# Patient Record
Sex: Female | Born: 1955 | Race: White | Hispanic: No | Marital: Married | Smoking: Former smoker
Health system: Southern US, Community
[De-identification: ages and names within clinical notes are randomized; demographics above are authoritative.]

## PROBLEM LIST (undated history)

## (undated) DIAGNOSIS — T7840XA Allergy, unspecified, initial encounter: Secondary | ICD-10-CM

## (undated) DIAGNOSIS — K219 Gastro-esophageal reflux disease without esophagitis: Secondary | ICD-10-CM

## (undated) DIAGNOSIS — M1990 Unspecified osteoarthritis, unspecified site: Secondary | ICD-10-CM

## (undated) DIAGNOSIS — L570 Actinic keratosis: Secondary | ICD-10-CM

## (undated) DIAGNOSIS — G43909 Migraine, unspecified, not intractable, without status migrainosus: Secondary | ICD-10-CM

## (undated) DIAGNOSIS — E079 Disorder of thyroid, unspecified: Secondary | ICD-10-CM

## (undated) DIAGNOSIS — K859 Acute pancreatitis without necrosis or infection, unspecified: Secondary | ICD-10-CM

## (undated) HISTORY — PX: SPINE SURGERY: SHX786

## (undated) HISTORY — DX: Unspecified osteoarthritis, unspecified site: M19.90

## (undated) HISTORY — DX: Gastro-esophageal reflux disease without esophagitis: K21.9

## (undated) HISTORY — PX: ABDOMINAL HYSTERECTOMY: SHX81

## (undated) HISTORY — PX: TOTAL ABDOMINAL HYSTERECTOMY: SHX209

## (undated) HISTORY — DX: Allergy, unspecified, initial encounter: T78.40XA

## (undated) HISTORY — DX: Migraine, unspecified, not intractable, without status migrainosus: G43.909

## (undated) HISTORY — DX: Actinic keratosis: L57.0

## (undated) HISTORY — DX: Disorder of thyroid, unspecified: E07.9

## (undated) HISTORY — PX: CHOLECYSTECTOMY: SHX55

## (undated) HISTORY — PX: APPENDECTOMY: SHX54

## (undated) HISTORY — PX: TUBAL LIGATION: SHX77

## (undated) HISTORY — PX: FOOT SURGERY: SHX648

## (undated) HISTORY — PX: OTHER SURGICAL HISTORY: SHX169

## (undated) HISTORY — DX: Acute pancreatitis without necrosis or infection, unspecified: K85.90

---

## 2016-11-28 DIAGNOSIS — Z86010 Personal history of colonic polyps: Secondary | ICD-10-CM | POA: Diagnosis not present

## 2016-11-28 DIAGNOSIS — K2950 Unspecified chronic gastritis without bleeding: Secondary | ICD-10-CM | POA: Diagnosis not present

## 2016-11-28 DIAGNOSIS — R1013 Epigastric pain: Secondary | ICD-10-CM | POA: Diagnosis not present

## 2016-11-28 DIAGNOSIS — K2960 Other gastritis without bleeding: Secondary | ICD-10-CM | POA: Diagnosis not present

## 2016-11-28 DIAGNOSIS — Z800 Family history of malignant neoplasm of digestive organs: Secondary | ICD-10-CM | POA: Diagnosis not present

## 2016-11-28 DIAGNOSIS — K219 Gastro-esophageal reflux disease without esophagitis: Secondary | ICD-10-CM | POA: Diagnosis not present

## 2016-11-28 DIAGNOSIS — Z8371 Family history of colonic polyps: Secondary | ICD-10-CM | POA: Diagnosis not present

## 2016-11-28 DIAGNOSIS — Z1211 Encounter for screening for malignant neoplasm of colon: Secondary | ICD-10-CM | POA: Diagnosis not present

## 2016-11-28 LAB — HM COLONOSCOPY

## 2016-12-24 DIAGNOSIS — E041 Nontoxic single thyroid nodule: Secondary | ICD-10-CM | POA: Diagnosis not present

## 2016-12-24 DIAGNOSIS — E559 Vitamin D deficiency, unspecified: Secondary | ICD-10-CM | POA: Diagnosis not present

## 2016-12-24 DIAGNOSIS — Z0000 Encounter for general adult medical examination without abnormal findings: Secondary | ICD-10-CM | POA: Diagnosis not present

## 2016-12-28 DIAGNOSIS — E041 Nontoxic single thyroid nodule: Secondary | ICD-10-CM | POA: Diagnosis not present

## 2016-12-28 DIAGNOSIS — Z0000 Encounter for general adult medical examination without abnormal findings: Secondary | ICD-10-CM | POA: Diagnosis not present

## 2016-12-28 DIAGNOSIS — E039 Hypothyroidism, unspecified: Secondary | ICD-10-CM | POA: Diagnosis not present

## 2016-12-28 DIAGNOSIS — Z23 Encounter for immunization: Secondary | ICD-10-CM | POA: Diagnosis not present

## 2016-12-28 DIAGNOSIS — E559 Vitamin D deficiency, unspecified: Secondary | ICD-10-CM | POA: Diagnosis not present

## 2017-01-22 DIAGNOSIS — L821 Other seborrheic keratosis: Secondary | ICD-10-CM | POA: Diagnosis not present

## 2017-01-22 DIAGNOSIS — L570 Actinic keratosis: Secondary | ICD-10-CM | POA: Diagnosis not present

## 2017-02-19 DIAGNOSIS — Z1231 Encounter for screening mammogram for malignant neoplasm of breast: Secondary | ICD-10-CM | POA: Diagnosis not present

## 2017-02-19 DIAGNOSIS — M8589 Other specified disorders of bone density and structure, multiple sites: Secondary | ICD-10-CM | POA: Diagnosis not present

## 2017-02-21 DIAGNOSIS — R1011 Right upper quadrant pain: Secondary | ICD-10-CM | POA: Diagnosis not present

## 2017-02-21 DIAGNOSIS — R1013 Epigastric pain: Secondary | ICD-10-CM | POA: Diagnosis not present

## 2017-04-15 DIAGNOSIS — R1013 Epigastric pain: Secondary | ICD-10-CM | POA: Diagnosis not present

## 2017-04-30 DIAGNOSIS — K831 Obstruction of bile duct: Secondary | ICD-10-CM | POA: Diagnosis not present

## 2017-04-30 DIAGNOSIS — K838 Other specified diseases of biliary tract: Secondary | ICD-10-CM | POA: Diagnosis not present

## 2017-04-30 DIAGNOSIS — K859 Acute pancreatitis without necrosis or infection, unspecified: Secondary | ICD-10-CM | POA: Diagnosis not present

## 2017-04-30 DIAGNOSIS — R1013 Epigastric pain: Secondary | ICD-10-CM | POA: Diagnosis not present

## 2017-05-03 DIAGNOSIS — R1013 Epigastric pain: Secondary | ICD-10-CM | POA: Diagnosis not present

## 2017-05-03 DIAGNOSIS — K3184 Gastroparesis: Secondary | ICD-10-CM | POA: Diagnosis not present

## 2017-05-03 DIAGNOSIS — K8500 Idiopathic acute pancreatitis without necrosis or infection: Secondary | ICD-10-CM | POA: Diagnosis not present

## 2017-05-10 DIAGNOSIS — Z23 Encounter for immunization: Secondary | ICD-10-CM | POA: Diagnosis not present

## 2017-05-28 DIAGNOSIS — K3184 Gastroparesis: Secondary | ICD-10-CM | POA: Diagnosis not present

## 2017-09-11 DIAGNOSIS — M7702 Medial epicondylitis, left elbow: Secondary | ICD-10-CM | POA: Diagnosis not present

## 2017-09-11 DIAGNOSIS — M25522 Pain in left elbow: Secondary | ICD-10-CM | POA: Diagnosis not present

## 2017-09-11 DIAGNOSIS — M7712 Lateral epicondylitis, left elbow: Secondary | ICD-10-CM | POA: Diagnosis not present

## 2017-10-11 DIAGNOSIS — B349 Viral infection, unspecified: Secondary | ICD-10-CM | POA: Diagnosis not present

## 2018-01-10 DIAGNOSIS — Z0000 Encounter for general adult medical examination without abnormal findings: Secondary | ICD-10-CM | POA: Diagnosis not present

## 2018-01-10 DIAGNOSIS — M810 Age-related osteoporosis without current pathological fracture: Secondary | ICD-10-CM | POA: Insufficient documentation

## 2018-01-10 DIAGNOSIS — M8589 Other specified disorders of bone density and structure, multiple sites: Secondary | ICD-10-CM | POA: Diagnosis not present

## 2018-01-10 DIAGNOSIS — E039 Hypothyroidism, unspecified: Secondary | ICD-10-CM | POA: Insufficient documentation

## 2018-01-10 DIAGNOSIS — K219 Gastro-esophageal reflux disease without esophagitis: Secondary | ICD-10-CM | POA: Diagnosis not present

## 2018-01-10 DIAGNOSIS — M8580 Other specified disorders of bone density and structure, unspecified site: Secondary | ICD-10-CM | POA: Insufficient documentation

## 2018-01-29 DIAGNOSIS — E039 Hypothyroidism, unspecified: Secondary | ICD-10-CM | POA: Diagnosis not present

## 2018-02-06 DIAGNOSIS — M8589 Other specified disorders of bone density and structure, multiple sites: Secondary | ICD-10-CM | POA: Diagnosis not present

## 2018-03-10 ENCOUNTER — Ambulatory Visit: Payer: BLUE CROSS/BLUE SHIELD

## 2018-03-31 ENCOUNTER — Encounter: Payer: Self-pay | Admitting: Urology

## 2018-03-31 ENCOUNTER — Ambulatory Visit (INDEPENDENT_AMBULATORY_CARE_PROVIDER_SITE_OTHER): Payer: BLUE CROSS/BLUE SHIELD | Admitting: Urology

## 2018-03-31 VITALS — BP 113/73 | HR 85 | Ht 67.5 in | Wt 167.8 lb

## 2018-03-31 DIAGNOSIS — N390 Urinary tract infection, site not specified: Secondary | ICD-10-CM

## 2018-03-31 LAB — URINALYSIS, COMPLETE
Bilirubin, UA: NEGATIVE
GLUCOSE, UA: NEGATIVE
KETONES UA: NEGATIVE
Leukocytes, UA: NEGATIVE
NITRITE UA: NEGATIVE
Protein, UA: NEGATIVE
Specific Gravity, UA: 1.02 (ref 1.005–1.030)
UUROB: 0.2 mg/dL (ref 0.2–1.0)
pH, UA: 5.5 (ref 5.0–7.5)

## 2018-03-31 LAB — MICROSCOPIC EXAMINATION

## 2018-03-31 MED ORDER — NITROFURANTOIN MONOHYD MACRO 100 MG PO CAPS: 100.0000 mg | ORAL_CAPSULE | ORAL | 4 refills | Status: DC | PRN

## 2018-03-31 NOTE — Progress Notes (Signed)
03/31/2018 11:47 AM   Cindy Jefferson [birth year] [id number]  Referring provider: Glendon Axe, MD [facility] [address], [place] [address]  Chief Complaint  Patient presents with  . Recurrent UTI    HPI: I was consulted to assess the patient recurrent bladder infections.  She saw a urologist a few years ago in [place].  She was doing well on postcoital prophylaxis with Macrobid.  She then had a breakthrough infection.  When she treated herself with Macrobid for a number of days she had nausea.  She was then switched to Keflex after intercourse but is still had 2 infections.  When she is not infected she leaks with coughing sneezing and running.  She has urge incontinence.  The stress components is more significant.  She wears 2 or 3 pads a day that are damp unless she last or runs hard.  She describes an ultrasound from her previous urologist  She denies a history of kidney stones and previous GU surgery.  She just moved to the area  Modifying factors: There are no other modifying factors  Associated signs and symptoms: There are no other associated signs and symptoms Aggravating and relieving factors: There are no other aggravating or relieving factors Severity: Moderate Duration: Persistent   PMH: Past Medical History:  Diagnosis Date  . GERD (gastroesophageal reflux disease)   . Thyroid disease     Surgical History:   Home Medications:  Allergies as of 03/31/2018      Reactions   Valproic Acid Other (See Comments)   Other reaction(s): Other (See Comments) pancreatitis  pancreatitis    Tape    Other reaction(s): Unknown Other reaction(s): BURNS SKIN Other reaction(s): BURNS SKIN   Erythromycin Nausea Only      Medication List        Accurate as of 03/31/18 11:47 AM. Always use your most recent med list.          cephALEXin 500 MG capsule Commonly known as:  KEFLEX Take 500 mg by mouth 4 (four) times daily.   levothyroxine 75  MCG tablet Commonly known as:  SYNTHROID, LEVOTHROID Take 75 mcg by mouth daily.   magnesium oxide 400 MG tablet Commonly known as:  MAG-OX Take by mouth.   mometasone 50 MCG/ACT nasal spray Commonly known as:  NASONEX Place into the nose.   nitrofurantoin (macrocrystal-monohydrate) 100 MG capsule Commonly known as:  MACROBID Take 1 capsule (100 mg total) by mouth as needed (as directed).   ranitidine 150 MG capsule Commonly known as:  ZANTAC TAKE 1 CAPSULE (150 MG TOTAL) BY MOUTH EVERY MORNING   RELPAX 40 MG tablet Generic drug:  eletriptan Take by mouth.   Vitamin D3 2000 units capsule Take by mouth.       Allergies:  Allergies  Allergen Reactions  . Valproic Acid Other (See Comments)    Other reaction(s): Other (See Comments) pancreatitis  pancreatitis    . Tape     Other reaction(s): Unknown Other reaction(s): BURNS SKIN Other reaction(s): BURNS SKIN   . Erythromycin Nausea Only    Family History: Family History  Problem Relation Age of Onset  . Bladder Cancer Maternal Uncle   . Kidney cancer Neg Hx     Social History:  reports that she has never smoked. She has never used smokeless tobacco. She reports that she drinks alcohol. She reports that she does not use drugs.  ROS: UROLOGY Frequent Urination?: No Hard to postpone urination?: Yes Burning/pain with  urination?: No Get up at night to urinate?: Yes Leakage of urine?: Yes Urine stream starts and stops?: No Trouble starting stream?: No Do you have to strain to urinate?: No Blood in urine?: No Urinary tract infection?: No Sexually transmitted disease?: No Injury to kidneys or bladder?: No Painful intercourse?: No Weak stream?: No Currently pregnant?: No Vaginal bleeding?: No Last menstrual period?: n  Gastrointestinal Nausea?: No Vomiting?: No Indigestion/heartburn?: Yes Diarrhea?: No Constipation?: No  Constitutional Fever: No Night sweats?: No Weight loss?: No Fatigue?:  No  Skin Skin rash/lesions?: No Itching?: No  Eyes Blurred vision?: No Double vision?: No  Ears/Nose/Throat Sore throat?: No Sinus problems?: No  Hematologic/Lymphatic Swollen glands?: No Easy bruising?: No  Cardiovascular Leg swelling?: No Chest pain?: No  Respiratory Cough?: No Shortness of breath?: No  Endocrine Excessive thirst?: No  Musculoskeletal Back pain?: Yes Joint pain?: Yes  Neurological Headaches?: Yes Dizziness?: No  Psychologic Depression?: No Anxiety?: No  Physical Exam: BP 113/73 (BP Location: Left Arm, Patient Position: Sitting, Cuff Size: Normal)   Pulse 85   Ht 5' 7.5" (1.715 m)   Wt 167 lb 12.8 oz (76.1 kg)   BMI 25.89 kg/m   Constitutional:  Alert and oriented, No acute distress. HEENT: [place] AT, moist mucus membranes.  Trachea midline, no masses. Cardiovascular: No clubbing, cyanosis, or edema. Respiratory: Normal respiratory effort, no increased work of breathing. GI: Abdomen is soft, nontender, nondistended, no abdominal masses GU: No CVA tenderness.  No bladder tenderness Skin: No rashes, bruises or suspicious lesions. Lymph: No cervical or inguinal adenopathy. Neurologic: Grossly intact, no focal deficits, moving all 4 extremities. Psychiatric: Normal mood and affect.  Laboratory Data:  Urinalysis No results found for: COLORURINE, APPEARANCEUR, LABSPEC, PHURINE, GLUCOSEU, HGBUR, BILIRUBINUR, KETONESUR, PROTEINUR, UROBILINOGEN, NITRITE, LEUKOCYTESUR  Pertinent Imaging:   Assessment & Plan: The patient was not clinically infected today.  I offered postcoital Macrodantin.  Hopefully will not upset her stomach when only taking it infrequently and 1 tablet at the time.  Otherwise daily suppression therapy for instance with Keflex or trimethoprim would be appropriate this was discussed.  Call if urine culture is positive.  Reassess in 6 months  1. Recurrent UTI  - Urinalysis, Complete   Return in about 6 months (around  10/01/2018).  Reece Packer, MD  [facility] [address] [place], [place] [address] [phone]

## 2018-04-03 LAB — CULTURE, URINE COMPREHENSIVE

## 2018-06-09 DIAGNOSIS — M7751 Other enthesopathy of right foot: Secondary | ICD-10-CM | POA: Diagnosis not present

## 2018-06-09 DIAGNOSIS — M2012 Hallux valgus (acquired), left foot: Secondary | ICD-10-CM | POA: Diagnosis not present

## 2018-06-09 DIAGNOSIS — M79675 Pain in left toe(s): Secondary | ICD-10-CM | POA: Diagnosis not present

## 2018-06-24 DIAGNOSIS — E559 Vitamin D deficiency, unspecified: Secondary | ICD-10-CM | POA: Diagnosis not present

## 2018-06-24 DIAGNOSIS — E039 Hypothyroidism, unspecified: Secondary | ICD-10-CM | POA: Diagnosis not present

## 2018-06-24 DIAGNOSIS — K219 Gastro-esophageal reflux disease without esophagitis: Secondary | ICD-10-CM | POA: Diagnosis not present

## 2018-06-24 DIAGNOSIS — M810 Age-related osteoporosis without current pathological fracture: Secondary | ICD-10-CM | POA: Diagnosis not present

## 2018-06-24 DIAGNOSIS — Z23 Encounter for immunization: Secondary | ICD-10-CM | POA: Diagnosis not present

## 2018-06-30 DIAGNOSIS — M7751 Other enthesopathy of right foot: Secondary | ICD-10-CM | POA: Diagnosis not present

## 2018-06-30 DIAGNOSIS — M79675 Pain in left toe(s): Secondary | ICD-10-CM | POA: Diagnosis not present

## 2018-06-30 DIAGNOSIS — M2012 Hallux valgus (acquired), left foot: Secondary | ICD-10-CM | POA: Diagnosis not present

## 2018-08-12 DIAGNOSIS — L814 Other melanin hyperpigmentation: Secondary | ICD-10-CM | POA: Diagnosis not present

## 2018-08-12 DIAGNOSIS — L820 Inflamed seborrheic keratosis: Secondary | ICD-10-CM | POA: Diagnosis not present

## 2018-08-12 DIAGNOSIS — D229 Melanocytic nevi, unspecified: Secondary | ICD-10-CM | POA: Diagnosis not present

## 2018-09-14 ENCOUNTER — Encounter: Payer: Self-pay | Admitting: Nurse Practitioner

## 2018-09-15 ENCOUNTER — Other Ambulatory Visit: Payer: Self-pay

## 2018-09-15 ENCOUNTER — Encounter: Payer: Self-pay | Admitting: Nurse Practitioner

## 2018-09-15 ENCOUNTER — Ambulatory Visit: Payer: BLUE CROSS/BLUE SHIELD | Admitting: Nurse Practitioner

## 2018-09-15 VITALS — BP 128/79 | HR 67 | Temp 97.5°F | Ht 66.8 in | Wt 170.0 lb

## 2018-09-15 DIAGNOSIS — G43909 Migraine, unspecified, not intractable, without status migrainosus: Secondary | ICD-10-CM | POA: Insufficient documentation

## 2018-09-15 DIAGNOSIS — Z1231 Encounter for screening mammogram for malignant neoplasm of breast: Secondary | ICD-10-CM

## 2018-09-15 DIAGNOSIS — Z6827 Body mass index (BMI) 27.0-27.9, adult: Secondary | ICD-10-CM | POA: Insufficient documentation

## 2018-09-15 DIAGNOSIS — K219 Gastro-esophageal reflux disease without esophagitis: Secondary | ICD-10-CM

## 2018-09-15 DIAGNOSIS — E039 Hypothyroidism, unspecified: Secondary | ICD-10-CM | POA: Diagnosis not present

## 2018-09-15 DIAGNOSIS — M8589 Other specified disorders of bone density and structure, multiple sites: Secondary | ICD-10-CM

## 2018-09-15 DIAGNOSIS — E663 Overweight: Secondary | ICD-10-CM | POA: Diagnosis not present

## 2018-09-15 DIAGNOSIS — Z9189 Other specified personal risk factors, not elsewhere classified: Secondary | ICD-10-CM

## 2018-09-15 NOTE — Assessment & Plan Note (Signed)
Continue weekly exercise (30 minutes 5 days a week) and focus on diet regimen.

## 2018-09-15 NOTE — Assessment & Plan Note (Signed)
Chronic, stable.  Continue current medication regimen.  Magnesium level next visit.

## 2018-09-15 NOTE — Assessment & Plan Note (Signed)
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  Continue to monitor mood and for s/s caregiver role strain.

## 2018-09-15 NOTE — Progress Notes (Signed)
New Patient Office Visit  Subjective:  Patient ID: Cindy Jefferson, female    DOB: Mar 21, 1956  Age: 63 y.o. MRN: [id number]  CC:  Chief Complaint  Patient presents with  . Establish Care    HPI Cindy Jefferson presents for new patient visit to establish care.  Introduced to Designer, jewellery role and practice setting.  All questions answered.  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  GERD Has been on Pepcid for two months, was previously on Zantac.  Her GI provider had placed her on Protonix.  Has been seen by GI at [facility] and [facility].  Had her last colonoscopy in 2018 and is due again in 5 years, d/t polyps (2 removed). GERD control status: controlled  Satisfied with current treatment? yes Heartburn frequency:  Medication side effects: no  Medication compliance: stable Previous GERD medications: Antacid use frequency:   Duration:  Nature:  Location:  Heartburn duration:  Alleviatiating factors:   Aggravating factors:  Dysphagia: no Odynophagia:  no Hematemesis: no Blood in stool: no EGD: no   HYPOTHYROIDISM Saw endo in [place] for "awhile because they said I had a nodule, but the last time I went they said it was down to nothing and I did not need to worry about it".  Has been on Levothyroxine 75 MCG daily.  It has "been pretty long since last ultrasound".  Last TSH 1.841 on 01/10/18.  Thyroid control status:controlled Satisfied with current treatment? yes Medication side effects: no Medication compliance: good compliance Etiology of hypothyroidism:  Recent dose adjustment:no Fatigue: no Cold intolerance: no Heat intolerance: no Weight gain: no Weight loss: no Constipation: no Diarrhea/loose stools: no Palpitations: no Lower extremity edema: no Anxiety/depressed mood: no   OSTEOPOROSIS Last DEXA June 2019 with hip -2.5, fem neck -2.3, and  spine -1.7.  Currently takes daily Vitamin D.  Is post menopausal. Satisfied with current treatment?: no Medication side effects: no Medication compliance: good compliance Past osteoporosis medications/treatments:  Adequate calcium & vitamin D: yes Intolerance to bisphosphonates:no Weight bearing exercises: yes  Past Medical History:  Diagnosis Date  . GERD (gastroesophageal reflux disease)   . Migraine   . Migraines   . Pancreatitis   . Thyroid disease     Past Surgical History:  Procedure Laterality Date  . ABDOMINAL HYSTERECTOMY     total, does not get pap smears   . CHOLECYSTECTOMY    . FOOT SURGERY    . neck fusion      Family History  Problem Relation Age of Onset  . Hyperlipidemia Mother   . Heart disease Mother   . Lung cancer Mother   . Leukemia Father   . Arthritis Sister   . Thyroid disease Sister   . Colon cancer Maternal Grandfather   . Lupus Daughter   . Kidney cancer Neg Hx     Social History   Socioeconomic History  . Marital status: Married    Spouse name: Not on file  . Number of children: 2  . Years of education: Not on file  . Highest education level: Not on file  Occupational History  . Occupation: retired  Scientific laboratory technician  . Financial resource strain: Not hard at all  . Food insecurity:    Worry: Never true    Inability: Never true  . Transportation needs:    Medical: No    Non-medical: No  Tobacco Use  . Smoking status: Never Smoker  . Smokeless tobacco: Never Used  Substance and Sexual Activity  . Alcohol use: Not Currently  . Drug use: Never  . Sexual activity: Yes  Lifestyle  . Physical activity:    Days per week: 4 days    Minutes per session: 30 min  . Stress: To some extent  Relationships  . Social connections:    Talks on phone: More than three times a week    Gets together: More than three times a week    Attends religious service: Never    Active member of club or organization: No    Attends meetings of clubs or  organizations: Never    Relationship status: Married  . Intimate partner violence:    Fear of current or ex partner: No    Emotionally abused: No    Physically abused: No    Forced sexual activity: No  Other Topics Concern  . Not on file  Social History Narrative  . Not on file    ROS Review of Systems  Constitutional: Negative for activity change, appetite change, diaphoresis, fatigue and fever.  Respiratory: Negative for cough, chest tightness and shortness of breath.   Cardiovascular: Negative for chest pain, palpitations and leg swelling.  Gastrointestinal: Negative for abdominal distention, abdominal pain, constipation, diarrhea, nausea and vomiting.  Endocrine: Negative for cold intolerance, heat intolerance, polydipsia, polyphagia and polyuria.  Neurological: Negative for dizziness, syncope, weakness, light-headedness, numbness and headaches.  Psychiatric/Behavioral: Negative.     Objective:   Today's Vitals: BP 128/79   Pulse 67   Temp (!) 97.5 F (36.4 C) (Oral)   Ht 5' 6.8" (1.697 m)   Wt 170 lb (77.1 kg)   SpO2 100%   BMI 26.79 kg/m   Physical Exam Vitals signs and nursing note reviewed.  Constitutional:      General: She is awake.     Appearance: She is well-developed.  HENT:     Head: Normocephalic.     Right Ear: Hearing normal.     Left Ear: Hearing normal.     Nose: Nose normal.     Mouth/Throat:     Mouth: Mucous membranes are moist.  Eyes:     General: Lids are normal.        Right eye: No discharge.        Left eye: No discharge.     Conjunctiva/sclera: Conjunctivae normal.     Pupils: Pupils are equal, round, and reactive to light.  Neck:     Musculoskeletal: Normal range of motion and neck supple.     Thyroid: No thyromegaly.     Vascular: No carotid bruit or JVD.  Cardiovascular:     Rate and Rhythm: Normal rate and regular rhythm.     Heart sounds: Normal heart sounds. No murmur. No gallop.   Pulmonary:     Effort: Pulmonary effort  is normal.     Breath sounds: Normal breath sounds.  Abdominal:     General: Bowel sounds are normal.     Palpations: Abdomen is soft. There is no hepatomegaly or splenomegaly.  Musculoskeletal:     Right lower leg: No edema.     Left lower leg: No edema.  Lymphadenopathy:     Cervical: No cervical adenopathy.  Skin:    General: Skin is warm and dry.  Neurological:     Mental Status: She is alert and oriented to person, place, and time.  Psychiatric:  Attention and Perception: Attention normal.        Mood and Affect: Mood normal.        Behavior: Behavior normal. Behavior is cooperative.        Thought Content: Thought content normal.        Judgment: Judgment normal.     Assessment & Plan:   Problem List Items Addressed This Visit      Digestive   GERD without esophagitis    Chronic, stable.  Continue current medication regimen.  Magnesium level next visit.      Relevant Medications   famotidine (PEPCID) 20 MG tablet     Endocrine   Acquired hypothyroidism - Primary    Chronic, stable.  Continue current medication regimen.  Thyroid panel next visit.        Musculoskeletal and Integument   Osteopenia of multiple sites    Chronic, last DEXA June 2019.  Continue Vitamin D.  Check level next visit.          Other   Overweight (BMI 25.0-29.9)    Continue weekly exercise (30 minutes 5 days a week) and focus on diet regimen.      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  Continue to monitor mood and for s/s caregiver role strain.       Other Visit Diagnoses    Screening mammogram, encounter for       Relevant Orders   MM DIGITAL SCREENING BILATERAL      Outpatient Encounter Medications as of 09/15/2018  Medication Sig  . Calcium-Cholecalciferol (CALCET PETITES) 200-250 MG-UNIT TABS Take by mouth.  . Cholecalciferol (VITAMIN D3) 2000 units capsule Take by mouth.  . eletriptan (RELPAX) 40 MG tablet Take by mouth.   . famotidine (PEPCID) 20 MG tablet Take by mouth.  . levothyroxine (SYNTHROID, LEVOTHROID) 75 MCG tablet Take 75 mcg by mouth daily.  . magnesium oxide (MAG-OX) 400 MG tablet Take by mouth.  . mometasone (NASONEX) 50 MCG/ACT nasal spray Place into the nose.  . nitrofurantoin, macrocrystal-monohydrate, (MACROBID) 100 MG capsule Take 1 capsule (100 mg total) by mouth as needed (as directed).  . [DISCONTINUED] cephALEXin (KEFLEX) 500 MG capsule Take 500 mg by mouth 4 (four) times daily.  . [DISCONTINUED] ranitidine (ZANTAC) 150 MG capsule TAKE 1 CAPSULE (150 MG TOTAL) BY MOUTH EVERY MORNING   No facility-administered encounter medications on file as of 09/15/2018.     Follow-up: Return in about 4 weeks (around 10/13/2018) for Physical exam.   Venita Lick, NP

## 2018-09-15 NOTE — Assessment & Plan Note (Signed)
Chronic, stable.  Continue current medication regimen.  Thyroid panel next visit.

## 2018-09-15 NOTE — Assessment & Plan Note (Signed)
Chronic, last DEXA June 2019.  Continue Vitamin D.  Check level next visit.

## 2018-09-15 NOTE — Patient Instructions (Signed)
Hypothyroidism  Hypothyroidism is when the thyroid gland does not make enough of certain hormones (it is underactive). The thyroid gland is a small gland located in the lower front part of the neck, just in front of the windpipe (trachea). This gland makes hormones that help control how the body uses food for energy (metabolism) as well as how the heart and brain function. These hormones also play a role in keeping your bones strong. When the thyroid is underactive, it produces too little of the hormones thyroxine (T4) and triiodothyronine (T3). What are the causes? This condition may be caused by:  Hashimoto's disease. This is a disease in which the body's disease-fighting system (immune system) attacks the thyroid gland. This is the most common cause.  Viral infections.  Pregnancy.  Certain medicines.  Birth defects.  Past radiation treatments to the head or neck for cancer.  Past treatment with radioactive iodine.  Past exposure to radiation in the environment.  Past surgical removal of part or all of the thyroid.  Problems with a gland in the center of the brain (pituitary gland).  Lack of enough iodine in the diet. What increases the risk? You are more likely to develop this condition if:  You are female.  You have a family history of thyroid conditions.  You use a medicine called lithium.  You take medicines that affect the immune system (immunosuppressants). What are the signs or symptoms? Symptoms of this condition include:  Feeling as though you have no energy (lethargy).  Not being able to tolerate cold.  Weight gain that is not explained by a change in diet or exercise habits.  Lack of appetite.  Dry skin.  Coarse hair.  Menstrual irregularity.  Slowing of thought processes.  Constipation.  Sadness or depression. How is this diagnosed? This condition may be diagnosed based on:  Your symptoms, your medical history, and a physical exam.  Blood  tests. You may also have imaging tests, such as an ultrasound or MRI. How is this treated? This condition is treated with medicine that replaces the thyroid hormones that your body does not make. After you begin treatment, it may take several weeks for symptoms to go away. Follow these instructions at home:  Take over-the-counter and prescription medicines only as told by your health care provider.  If you start taking any new medicines, tell your health care provider.  Keep all follow-up visits as told by your health care provider. This is important. ? As your condition improves, your dosage of thyroid hormone medicine may change. ? You will need to have blood tests regularly so that your health care provider can monitor your condition. Contact a health care provider if:  Your symptoms do not get better with treatment.  You are taking thyroid replacement medicine and you: ? Sweat a lot. ? Have tremors. ? Feel anxious. ? Lose weight rapidly. ? Cannot tolerate heat. ? Have emotional swings. ? Have diarrhea. ? Feel weak. Get help right away if you have:  Chest pain.  An irregular heartbeat.  A rapid heartbeat.  Difficulty breathing. Summary  Hypothyroidism is when the thyroid gland does not make enough of certain hormones (it is underactive).  When the thyroid is underactive, it produces too little of the hormones thyroxine (T4) and triiodothyronine (T3).  The most common cause is Hashimoto's disease, a disease in which the body's disease-fighting system (immune system) attacks the thyroid gland. The condition can also be caused by viral infections, medicine, pregnancy, or past   radiation treatment to the head or neck.  Symptoms may include weight gain, dry skin, constipation, feeling as though you do not have energy, and not being able to tolerate cold.  This condition is treated with medicine to replace the thyroid hormones that your body does not make. This information  is not intended to replace advice given to you by your health care provider. Make sure you discuss any questions you have with your health care provider. Document Released: 08/20/2005 Document Revised: 07/31/2017 Document Reviewed: 07/31/2017 Elsevier Interactive Patient Education  2019 [employer]  

## 2018-10-06 ENCOUNTER — Ambulatory Visit: Payer: BLUE CROSS/BLUE SHIELD | Admitting: Urology

## 2018-10-16 ENCOUNTER — Encounter: Payer: BLUE CROSS/BLUE SHIELD | Admitting: Nurse Practitioner

## 2018-10-19 ENCOUNTER — Encounter: Payer: Self-pay | Admitting: Nurse Practitioner

## 2018-10-20 ENCOUNTER — Ambulatory Visit: Payer: BLUE CROSS/BLUE SHIELD | Admitting: Urology

## 2018-10-20 ENCOUNTER — Encounter: Payer: Self-pay | Admitting: Urology

## 2018-10-20 VITALS — BP 107/70 | HR 74 | Ht 67.0 in | Wt 175.0 lb

## 2018-10-20 DIAGNOSIS — N3946 Mixed incontinence: Secondary | ICD-10-CM

## 2018-10-20 MED ORDER — NITROFURANTOIN MONOHYD MACRO 100 MG PO CAPS: 100.0000 mg | ORAL_CAPSULE | ORAL | 4 refills | Status: DC | PRN

## 2018-10-20 NOTE — Progress Notes (Signed)
10/20/2018 9:34 AM   Cindy Jefferson 04-09-1956 [id number]  Referring provider: Glendon Axe, MD [facility] [place], [place] [address]  Chief Complaint  Patient presents with  . Follow-up    9month    HPI: I was consulted to assess the patient recurrent bladder infections.  She saw a urologist a few years ago in [place].  She was doing well on postcoital prophylaxis with Macrobid.  She then had a breakthrough infection.  When she treated herself with Macrobid for a number of days she had nausea.  She was then switched to Keflex after intercourse but is still had 2 infections.  When she is not infected she leaks with coughing sneezing and running.  She has urge incontinence.  The stress components is more significant.  She wears 2 or 3 pads a day that are damp unless she last or runs hard.   The patient was not clinically infected today.  I offered postcoital Macrodantin.  Hopefully will not upset her stomach when only taking it infrequently and 1 tablet at the time.  Otherwise daily suppression therapy for instance with Keflex or trimethoprim would be appropriate this was discussed.  Call if urine culture is positive.  Reassess in 6 months  TOday No infections on postcoital Macrodantin.  No upset stomach.  Mild mixed incontinence stable and well-tolerated.  She mentioned renal function test but I did not have any to comment on  PMH: Past Medical History:  Diagnosis Date  . GERD (gastroesophageal reflux disease)   . Migraine   . Migraines   . Pancreatitis   . Thyroid disease     Surgical History: Past Surgical History:  Procedure Laterality Date  . ABDOMINAL HYSTERECTOMY     total, does not get pap smears   . CHOLECYSTECTOMY    . FOOT SURGERY    . neck fusion      Home Medications:  Allergies as of 10/20/2018      Reactions   Valproic Acid Other (See Comments)   Other reaction(s): Other (See Comments) pancreatitis  pancreatitis    Tape     Other reaction(s): Unknown Other reaction(s): BURNS SKIN Other reaction(s): BURNS SKIN   Erythromycin Nausea Only      Medication List       Accurate as of October 20, 2018  9:34 AM. Always use your most recent med list.        CALCET PETITES 200-250 MG-UNIT Tabs Generic drug:  Calcium-Cholecalciferol Take by mouth.   famotidine 20 MG tablet Commonly known as:  PEPCID Take by mouth.   levothyroxine 75 MCG tablet Commonly known as:  SYNTHROID, LEVOTHROID Take 75 mcg by mouth daily.   magnesium oxide 400 MG tablet Commonly known as:  MAG-OX Take by mouth.   mometasone 50 MCG/ACT nasal spray Commonly known as:  NASONEX Place into the nose.   nitrofurantoin (macrocrystal-monohydrate) 100 MG capsule Commonly known as:  MACROBID Take 1 capsule (100 mg total) by mouth as needed (as directed).   RELPAX 40 MG tablet Generic drug:  eletriptan Take by mouth.   Vitamin D3 50 MCG (2000 UT) capsule Take by mouth.       Allergies:  Allergies  Allergen Reactions  . Valproic Acid Other (See Comments)    Other reaction(s): Other (See Comments) pancreatitis  pancreatitis    . Tape     Other reaction(s): Unknown Other reaction(s): BURNS SKIN Other reaction(s): BURNS SKIN   . Erythromycin Nausea Only  Family History: Family History  Problem Relation Age of Onset  . Hyperlipidemia Mother   . Heart disease Mother   . Lung cancer Mother   . Leukemia Father   . Arthritis Sister   . Thyroid disease Sister   . Colon cancer Maternal Grandfather   . Lupus Daughter   . Kidney cancer Neg Hx     Social History:  reports that she has never smoked. She has never used smokeless tobacco. She reports previous alcohol use. She reports that she does not use drugs.  ROS: UROLOGY Frequent Urination?: No Hard to postpone urination?: No Burning/pain with urination?: No Get up at night to urinate?: No Leakage of urine?: No Urine stream starts and stops?: No Trouble  starting stream?: No Do you have to strain to urinate?: No Blood in urine?: No Urinary tract infection?: No Sexually transmitted disease?: No Injury to kidneys or bladder?: No Painful intercourse?: No Weak stream?: No Currently pregnant?: No Vaginal bleeding?: No Last menstrual period?: n  Gastrointestinal Nausea?: No Vomiting?: No Indigestion/heartburn?: No Diarrhea?: No Constipation?: No  Constitutional Fever: No Night sweats?: No Weight loss?: No Fatigue?: No  Skin Skin rash/lesions?: No Itching?: No  Eyes Blurred vision?: No Double vision?: No  Ears/Nose/Throat Sore throat?: No Sinus problems?: No  Hematologic/Lymphatic Swollen glands?: No Easy bruising?: No  Cardiovascular Leg swelling?: No Chest pain?: No  Respiratory Cough?: No Shortness of breath?: No  Endocrine Excessive thirst?: No  Musculoskeletal Back pain?: Yes Joint pain?: No  Neurological Headaches?: Yes Dizziness?: No  Psychologic Depression?: No Anxiety?: No  Physical Exam: BP 107/70   Pulse 74   Ht 5\' 7"  (1.702 m)   Wt 175 lb (79.4 kg)   BMI 27.41 kg/m   Constitutional:  Alert and oriented, No acute distress.   Laboratory Data: No results found for: WBC, HGB, HCT, MCV, PLT  No results found for: CREATININE  No results found for: PSA  No results found for: TESTOSTERONE  No results found for: HGBA1C  Urinalysis    Component Value Date/Time   APPEARANCEUR Clear 03/31/2018 0953   GLUCOSEU Negative 03/31/2018 0953   BILIRUBINUR Negative 03/31/2018 0953   PROTEINUR Negative 03/31/2018 0953   NITRITE Negative 03/31/2018 0953   LEUKOCYTESUR Negative 03/31/2018 0953    Pertinent Imaging:   Assessment & Plan: Prescription renewed and I will see her in 1 year  There are no diagnoses linked to this encounter.  No follow-ups on file.  Reece Packer, MD  [facility] [address] [place], [place] [address] [phone]

## 2018-10-22 ENCOUNTER — Encounter: Payer: Self-pay | Admitting: Nurse Practitioner

## 2018-10-22 ENCOUNTER — Ambulatory Visit
Admission: RE | Admit: 2018-10-22 | Discharge: 2018-10-22 | Disposition: A | Discharge status: Home / Self Care | Payer: BLUE CROSS/BLUE SHIELD | Source: Ambulatory Visit | Attending: Nurse Practitioner | Admitting: Nurse Practitioner

## 2018-10-22 ENCOUNTER — Other Ambulatory Visit: Payer: Self-pay

## 2018-10-22 ENCOUNTER — Ambulatory Visit: Payer: BLUE CROSS/BLUE SHIELD | Admitting: Nurse Practitioner

## 2018-10-22 VITALS — BP 111/73 | HR 69 | Temp 97.6°F | Ht 67.0 in | Wt 173.0 lb

## 2018-10-22 DIAGNOSIS — G8929 Other chronic pain: Secondary | ICD-10-CM | POA: Insufficient documentation

## 2018-10-22 DIAGNOSIS — M542 Cervicalgia: Secondary | ICD-10-CM | POA: Insufficient documentation

## 2018-10-22 DIAGNOSIS — G43709 Chronic migraine without aura, not intractable, without status migrainosus: Secondary | ICD-10-CM | POA: Diagnosis not present

## 2018-10-22 MED ORDER — ELETRIPTAN HYDROBROMIDE 40 MG PO TABS: 40.0000 mg | ORAL_TABLET | ORAL | 5 refills | Status: DC | PRN

## 2018-10-22 MED ORDER — DICLOFENAC SODIUM 1 % TD GEL: 2.0000 g | TRANSDERMAL | 2 refills | Status: AC | PRN

## 2018-10-22 NOTE — Assessment & Plan Note (Signed)
Chronic, ongoing.  Refill on Relpax sent.  Continue Magnesium daily.  Will discuss injectable options with her at next visit.

## 2018-10-22 NOTE — Progress Notes (Signed)
BP 111/73   Pulse 69   Temp 97.6 F (36.4 C) (Oral)   Ht 5\' 7"  (1.702 m)   Wt 173 lb (78.5 kg)   SpO2 100%   BMI 27.10 kg/m    Subjective:    Patient ID: Cindy Jefferson, female    DOB: July 07, 1956, 63 y.o.   MRN: [id number]  HPI: Cindy Jefferson is a 63 y.o. female  Chief Complaint  Patient presents with  . Migraine  . Neck Pain   MIGRAINES Currently reports use of Magnesium for this.  Was previously on preventative medication, but after pancreatitis was taken off all medications.  Currently takes Relpax, she ran out of this prescription recently.  Currently she uses Relpax four times minimum a month.  Reports that "a lot of time it is seasonal".  Was followed by neurology, states the migraines have improved.  No migraine today.  Does not take allergy medication on consistent basis. Duration: years Onset: gradual Location: frontal/eye/temples Headache duration: she reports they often last a long time as she does not take pills right away Radiation: yes Time of day headache occurs:  Alleviating factors: medication Aggravating factors: seasonal changes Headache status at time of visit: asymptomatic Treatments attempted: Treatments attempted: Relpax   Aura: no Nausea:  yes Vomiting: no Photophobia:  yes Phonophobia:  yes Effect on social functioning:  yes Numbers of missed days of school/work each month:  Confusion:  no Gait disturbance/ataxia:  no Behavioral changes:  no Fevers:  no   NECK PAIN: Had neck surgery, double fusion, in 2002 to 2003.  Has had off/on pain since this time to neck, but over past 3 months this has increased.  "Reminds me back of when it used to hurt" and is "starting to get nervous about it".   Notices it 3-4 days a week, last a couple hours if wakes up in morning with it, but if sitting on computer with it starting then it will last all day.  Pain is 7/10 when present.  She has tried not to take medication d/t her sensitive stomach.  Uses  Biofreeze, ice/heat, and PT stretches she has learned.  States this helps some.    Relevant past medical, surgical, family and social history reviewed and updated as indicated. Interim medical history since our last visit reviewed. Allergies and medications reviewed and updated.  Review of Systems  Constitutional: Negative for activity change, appetite change, diaphoresis, fatigue and fever.  Respiratory: Negative for cough, chest tightness and shortness of breath.   Cardiovascular: Negative for chest pain, palpitations and leg swelling.  Gastrointestinal: Negative for abdominal distention, abdominal pain, constipation, diarrhea, nausea and vomiting.  Endocrine: Negative for cold intolerance, heat intolerance, polydipsia, polyphagia and polyuria.  Musculoskeletal: Positive for neck pain.  Neurological: Positive for headaches (none today). Negative for dizziness, syncope, weakness, light-headedness and numbness.  Psychiatric/Behavioral: Negative.     Per HPI unless specifically indicated above     Objective:    BP 111/73   Pulse 69   Temp 97.6 F (36.4 C) (Oral)   Ht 5\' 7"  (1.702 m)   Wt 173 lb (78.5 kg)   SpO2 100%   BMI 27.10 kg/m   Wt Readings from Last 3 Encounters:  10/22/18 173 lb (78.5 kg)  10/20/18 175 lb (79.4 kg)  09/15/18 170 lb (77.1 kg)    Physical Exam Vitals signs and nursing note reviewed.  Constitutional:      General: She is awake.     Appearance:  She is well-developed.  HENT:     Head: Normocephalic.     Right Ear: Hearing normal.     Left Ear: Hearing normal.     Nose: Nose normal.     Mouth/Throat:     Mouth: Mucous membranes are moist.  Eyes:     General: Lids are normal.        Right eye: No discharge.        Left eye: No discharge.     Conjunctiva/sclera: Conjunctivae normal.     Pupils: Pupils are equal, round, and reactive to light.  Neck:     Musculoskeletal: Normal range of motion and neck supple.     Thyroid: No thyromegaly.      Vascular: No carotid bruit or JVD.  Cardiovascular:     Rate and Rhythm: Normal rate and regular rhythm.     Heart sounds: Normal heart sounds. No murmur. No gallop.   Pulmonary:     Effort: Pulmonary effort is normal.     Breath sounds: Normal breath sounds.  Abdominal:     General: Bowel sounds are normal.     Palpations: Abdomen is soft. There is no hepatomegaly or splenomegaly.  Musculoskeletal:     Cervical back: She exhibits tenderness and pain. She exhibits normal range of motion, no swelling and no edema.     Right lower leg: No edema.     Left lower leg: No edema.     Comments: Mild pain with ROM to neck and mild tenderness to palpation.    Lymphadenopathy:     Cervical: No cervical adenopathy.  Skin:    General: Skin is warm and dry.  Neurological:     Mental Status: She is alert and oriented to person, place, and time.  Psychiatric:        Attention and Perception: Attention normal.        Mood and Affect: Mood normal.        Behavior: Behavior normal. Behavior is cooperative.        Thought Content: Thought content normal.        Judgment: Judgment normal.     Results for orders placed or performed in visit on 03/31/18  CULTURE, URINE COMPREHENSIVE  Result Value Ref Range   Urine Culture, Comprehensive Final report    Organism ID, Bacteria Comment   Microscopic Examination  Result Value Ref Range   WBC, UA 0-5 0 - 5 /hpf   RBC, UA 0-2 0 - 2 /hpf   Epithelial Cells (non renal) 0-10 0 - 10 /hpf   Bacteria, UA Few (A) None seen/Few  Urinalysis, Complete  Result Value Ref Range   Specific Gravity, UA 1.020 1.005 - 1.030   pH, UA 5.5 5.0 - 7.5   Color, UA Yellow Yellow   Appearance Ur Clear Clear   Leukocytes, UA Negative Negative   Protein, UA Negative Negative/Trace   Glucose, UA Negative Negative   Ketones, UA Negative Negative   RBC, UA Trace (A) Negative   Bilirubin, UA Negative Negative   Urobilinogen, Ur 0.2 0.2 - 1.0 mg/dL   Nitrite, UA Negative  Negative   Microscopic Examination See below:       Assessment & Plan:   Problem List Items Addressed This Visit      Cardiovascular and Mediastinum   Migraines - Primary    Chronic, ongoing.  Refill on Relpax sent.  Continue Magnesium daily.  Will discuss injectable options with her at next visit.  Relevant Medications   eletriptan (RELPAX) 40 MG tablet     Other   Neck pain    Ongoing.  Obtain imaging.  Diclofenac gel for discomfort ordered.  She agrees with plan of care for imaging today and if abnormal imaging or increase discomfort will refer to neurosurgery.      Relevant Orders   DG Cervical Spine Complete       Follow up plan: Return in about 3 months (around 01/20/2019) for Physical.

## 2018-10-22 NOTE — Assessment & Plan Note (Signed)
Ongoing.  Obtain imaging.  Diclofenac gel for discomfort ordered.  She agrees with plan of care for imaging today and if abnormal imaging or increase discomfort will refer to neurosurgery.

## 2018-10-22 NOTE — Patient Instructions (Signed)
Cervical Radiculopathy    Cervical radiculopathy means that a nerve in the neck is pinched or bruised. This can cause pain or loss of feeling (numbness) that runs from your neck to your arm and fingers.  Follow these instructions at home:  Managing pain  · Take over-the-counter and prescription medicines only as told by your doctor.  · If directed, put ice on the injured or painful area.  ? Put ice in a plastic bag.  ? Place a towel between your skin and the bag.  ? Leave the ice on for 20 minutes, 2-3 times per day.  · If ice does not help, you can try using heat. Take a warm shower or warm bath, or use a heat pack as told by your doctor.  · You may try a gentle neck and shoulder massage.  Activity  · Rest as needed. Follow instructions from your doctor about any activities to avoid.  · Do exercises as told by your doctor or physical therapist.  General instructions  · If you were given a soft collar, wear it as told by your doctor.  · Use a flat pillow when you sleep.  · Keep all follow-up visits as told by your doctor. This is important.  Contact a doctor if:  · Your condition does not improve with treatment.  Get help right away if:  · Your pain gets worse and is not controlled with medicine.  · You lose feeling or feel weak in your hand, arm, face, or leg.  · You have a fever.  · You have a stiff neck.  · You cannot control when you poop or pee (have incontinence).  · You have trouble with walking, balance, or talking.  This information is not intended to replace advice given to you by your health care provider. Make sure you discuss any questions you have with your health care provider.  Document Released: 08/09/2011 Document Revised: 01/26/2016 Document Reviewed: 10/14/2014  Elsevier Interactive Patient Education © 2019 [employer]vier Inc[employer]

## 2019-01-01 ENCOUNTER — Emergency Department
Admission: EM | Admit: 2019-01-01 | Discharge: 2019-01-01 | Disposition: A | Discharge status: Home / Self Care | Payer: BLUE CROSS/BLUE SHIELD | Attending: Emergency Medicine | Admitting: Emergency Medicine

## 2019-01-01 ENCOUNTER — Encounter: Payer: Self-pay | Admitting: Emergency Medicine

## 2019-01-01 ENCOUNTER — Other Ambulatory Visit: Payer: Self-pay

## 2019-01-01 DIAGNOSIS — Y998 Other external cause status: Secondary | ICD-10-CM | POA: Diagnosis not present

## 2019-01-01 DIAGNOSIS — Y9389 Activity, other specified: Secondary | ICD-10-CM | POA: Insufficient documentation

## 2019-01-01 DIAGNOSIS — S61211A Laceration without foreign body of left index finger without damage to nail, initial encounter: Secondary | ICD-10-CM | POA: Diagnosis not present

## 2019-01-01 DIAGNOSIS — Z23 Encounter for immunization: Secondary | ICD-10-CM | POA: Diagnosis not present

## 2019-01-01 DIAGNOSIS — Y92009 Unspecified place in unspecified non-institutional (private) residence as the place of occurrence of the external cause: Secondary | ICD-10-CM | POA: Insufficient documentation

## 2019-01-01 DIAGNOSIS — W260XXA Contact with knife, initial encounter: Secondary | ICD-10-CM | POA: Insufficient documentation

## 2019-01-01 MED ORDER — CEPHALEXIN 500 MG PO CAPS: 1000.0000 mg | ORAL_CAPSULE | Freq: Two times a day (BID) | ORAL | 0 refills | Status: DC

## 2019-01-01 MED ORDER — TETANUS-DIPHTH-ACELL PERTUSSIS 5-2.5-18.5 LF-MCG/0.5 IM SUSP
0.5000 mL | Freq: Once | INTRAMUSCULAR | Status: AC
  Administered 2019-01-01: 19:00:00 0.5 mL via INTRAMUSCULAR
  Filled 2019-01-01: qty 0.5

## 2019-01-01 MED ORDER — LIDOCAINE HCL (PF) 1 % IJ SOLN
10.0000 mL | Freq: Once | INTRAMUSCULAR | Status: AC
  Administered 2019-01-01: 10 mL
  Filled 2019-01-01: qty 10

## 2019-01-01 NOTE — ED Notes (Signed)
Md at bedside to suture finger

## 2019-01-01 NOTE — ED Notes (Signed)
Patient reports was quilting and cut her finger with quilting blade. Presents applying pressure to area. Reports if she removes pressure blood gasses's everywhere. md to eval  Reports last t.d was 9 years ago.

## 2019-01-01 NOTE — ED Notes (Signed)
T.DAP TO LEFT ARM.

## 2019-01-01 NOTE — ED Triage Notes (Signed)
Patient presents to the ED with a laceration to her forefinger on her left hand from a "quilting blade."

## 2019-01-01 NOTE — ED Provider Notes (Signed)
[facility] Emergency Department Provider Note  ____________________________________________  Time seen: Approximately 6:40 PM  I have reviewed the triage vital signs and the nursing notes.   HISTORY  Chief Complaint Laceration    HPI Cindy Jefferson is a 63 y.o. female who presents the emergency department complaining of laceration to the index finger of the left hand.  Patient was using a fabric blade when it slipped, lacerating her finger.  Patient was concerned that she had difficulty stopping the bleeding.  She is on no blood thinners.  Full range of motion to the digit.  No other injury or complaint.  Her last tetanus shot was 9 years ago.         Past Medical History:  Diagnosis Date  . GERD (gastroesophageal reflux disease)   . Migraine   . Migraines   . Pancreatitis   . Thyroid disease     Patient Active Problem List   Diagnosis Date Noted  . Neck pain 10/22/2018  . Migraines 09/15/2018  . Overweight (BMI 25.0-29.9) 09/15/2018  . At high risk for caregiver role strain 09/15/2018  . Acquired hypothyroidism 01/10/2018  . GERD without esophagitis 01/10/2018  . Osteoporosis 01/10/2018    Past Surgical History:  Procedure Laterality Date  . ABDOMINAL HYSTERECTOMY     total, does not get pap smears   . CHOLECYSTECTOMY    . FOOT SURGERY    . neck fusion      Prior to Admission medications   Medication Sig Start Date End Date Taking? Authorizing Provider  Calcium-Cholecalciferol (CALCET PETITES) 200-250 MG-UNIT TABS Take by mouth. 07/15/18   [provider]  cephALEXin (KEFLEX) 500 MG capsule Take 2 capsules (1,000 mg total) by mouth 2 (two) times daily. 01/01/19   Eshawn Coor, Charline Bills, PA-C  Cholecalciferol (VITAMIN D3) 2000 units capsule Take by mouth.    [provider]  diclofenac sodium (VOLTAREN) 1 % GEL Apply 2 g topically as needed (four times a day as needed for neck pain). 10/22/18   Cannady, Henrine Screws T, NP   eletriptan (RELPAX) 40 MG tablet Take 1 tablet (40 mg total) by mouth every 2 (two) hours as needed for migraine or headache. 10/22/18   Marnee Guarneri T, NP  famotidine (PEPCID) 20 MG tablet Take by mouth. 07/15/18 07/15/19  [provider]  levothyroxine (SYNTHROID, LEVOTHROID) 75 MCG tablet Take 75 mcg by mouth daily. 03/13/18   [provider]  magnesium oxide (MAG-OX) 400 MG tablet Take by mouth.    [provider]  mometasone (NASONEX) 50 MCG/ACT nasal spray Place into the nose. 12/01/13   [provider]  nitrofurantoin, macrocrystal-monohydrate, (MACROBID) 100 MG capsule Take 1 capsule (100 mg total) by mouth as needed (as directed). 10/20/18   Bjorn Loser, MD    Allergies Valproic acid; Tape; and Erythromycin  Family History  Problem Relation Age of Onset  . Hyperlipidemia Mother   . Heart disease Mother   . Lung cancer Mother   . Leukemia Father   . Arthritis Sister   . Thyroid disease Sister   . Colon cancer Maternal Grandfather   . Lupus Daughter   . Kidney cancer Neg Hx     Social History Social History   Tobacco Use  . Smoking status: Never Smoker  . Smokeless tobacco: Never Used  Substance Use Topics  . Alcohol use: Not Currently  . Drug use: Never     Review of Systems  Constitutional: No fever/chills Eyes: No visual changes.  No discharge ENT: No upper respiratory complaints. Cardiovascular: no chest pain. Respiratory: no cough. No SOB. Gastrointestinal: No abdominal pain.  No nausea, no vomiting.  Musculoskeletal: Negative for musculoskeletal pain. Skin: Positive for laceration to the index finger of the left hand Neurological: Negative for headaches, focal weakness or numbness. 10-point ROS otherwise negative.  ____________________________________________   PHYSICAL EXAM:  VITAL SIGNS: ED Triage Vitals  Enc Vitals Group     BP 01/01/19 1818 129/82     Pulse Rate 01/01/19 1818 72     Resp 01/01/19 1818  18     Temp 01/01/19 1818 97.8 F (36.6 C)     Temp Source 01/01/19 1818 Oral     SpO2 01/01/19 1818 100 %     Weight 01/01/19 1814 164 lb (74.4 kg)     Height 01/01/19 1814 5\' 7"  (1.702 m)     Head Circumference --      Peak Flow --      Pain Score 01/01/19 1814 5     Pain Loc --      Pain Edu? --      Excl. in [place]? --      Constitutional: Alert and oriented. Well appearing and in no acute distress. Eyes: Conjunctivae are normal. PERRL. EOMI. Head: Atraumatic. Neck: No stridor.    Cardiovascular: Normal rate, regular rhythm. Normal S1 and S2.  Good peripheral circulation. Respiratory: Normal respiratory effort without tachypnea or retractions. Lungs CTAB. Good air entry to the bases with no decreased or absent breath sounds. Musculoskeletal: Full range of motion to all extremities. No gross deformities appreciated. Neurologic:  Normal speech and language. No gross focal neurologic deficits are appreciated.  Skin:  Skin is warm, dry and intact. No rash noted.  Visualization of the index finger left hand reveals a curved laceration running just lateral to the nailbed.  No nailbed involvement.  Area is bleeding.  No pulsatile flow.  Continuous dark blood.  Full range of motion to the digit.  Sensation and capillary refill intact.  Laceration length is approximately 2 cm in total. Psychiatric: Mood and affect are normal. Speech and behavior are normal. Patient exhibits appropriate insight and judgement.   ____________________________________________   LABS (all labs ordered are listed, but only abnormal results are displayed)  Labs Reviewed - No data to display ____________________________________________  EKG   ____________________________________________  RADIOLOGY   No results found.  ____________________________________________    PROCEDURES  Procedure(s) performed:    Marland KitchenMarland KitchenLaceration Repair Date/Time: 01/01/2019 7:25 PM Performed by: Darletta Moll,  PA-C Authorized by: Darletta Moll, PA-C   Consent:    Consent obtained:  Verbal   Consent given by:  Patient   Risks discussed:  Pain Anesthesia (see MAR for exact dosages):    Anesthesia method:  Nerve block   Block location:  Index finger L hand   Block needle gauge:  27 G   Block anesthetic:  Lidocaine 1% w/o epi   Block technique:  Digital block   Block injection procedure:  Anatomic landmarks identified, introduced needle, negative aspiration for blood and incremental injection   Block outcome:  Anesthesia achieved Laceration details:    Location:  Finger   Finger location:  L index finger   Length (cm):  2 Repair type:    Repair type:  Simple Pre-procedure details:    Preparation:  Patient was prepped and draped in usual sterile fashion Exploration:    Hemostasis achieved with:  Direct pressure   Wound exploration: wound  explored through full range of motion and entire depth of wound probed and visualized     Wound extent: vascular damage     Wound extent: no foreign bodies/material noted, no muscle damage noted, no nerve damage noted, no tendon damage noted and no underlying fracture noted     Contaminated: no   Treatment:    Area cleansed with:  Betadine   Amount of cleaning:  Standard   Irrigation solution:  Sterile saline   Irrigation volume:  500 ml   Irrigation method:  Syringe Skin repair:    Repair method:  Sutures   Suture size:  4-0   Suture material:  Nylon   Suture technique:  Simple interrupted   Number of sutures:  5 Approximation:    Approximation:  Close Post-procedure details:    Dressing:  Sterile dressing   Patient tolerance of procedure:  Tolerated well, no immediate complications      Medications  Tdap (BOOSTRIX) injection 0.5 mL (has no administration in time range)  lidocaine (PF) (XYLOCAINE) 1 % injection 10 mL (10 mLs Infiltration Given 01/01/19 1904)     ____________________________________________   INITIAL IMPRESSION  / ASSESSMENT AND PLAN / ED COURSE  Pertinent labs & imaging results that were available during my care of the patient were reviewed by me and considered in my medical decision making (see chart for details).  Review of the [place] CSRS was performed in accordance of the [place] prior to dispensing any controlled drugs.           Patient's diagnosis is consistent with finger laceration.  Patient presented to emergency department with a laceration to the index finger.  Patient did lacerate through a venule.  Bleeding was controlled with approximation of the injury.  Patient tolerated wound care and closure with no complications.  Wound care instructions discussed with patient.  Patient's tetanus shot is updated at this time.  Antibiotics prophylactically.  Follow-up primary care in 1 week for suture removal.. Patient is given ED precautions to return to the ED for any worsening or new symptoms.     ____________________________________________  FINAL CLINICAL IMPRESSION(S) / ED DIAGNOSES  Final diagnoses:  Laceration of left index finger without foreign body without damage to nail, initial encounter      NEW MEDICATIONS STARTED DURING THIS VISIT:  ED Discharge Orders         Ordered    cephALEXin (KEFLEX) 500 MG capsule  2 times daily     01/01/19 1928              This chart was dictated using voice recognition software/Dragon. Despite best efforts to proofread, errors can occur which can change the meaning. Any change was purely unintentional.    Darletta Moll, PA-C 01/01/19 Benay Pike, [place], MD 01/01/19 2251

## 2019-01-09 ENCOUNTER — Ambulatory Visit: Payer: BLUE CROSS/BLUE SHIELD | Admitting: Family Medicine

## 2019-01-09 ENCOUNTER — Other Ambulatory Visit: Payer: Self-pay

## 2019-01-09 ENCOUNTER — Encounter: Payer: Self-pay | Admitting: Family Medicine

## 2019-01-09 VITALS — BP 108/72 | HR 63 | Temp 98.1°F

## 2019-01-09 DIAGNOSIS — Z4802 Encounter for removal of sutures: Secondary | ICD-10-CM | POA: Diagnosis not present

## 2019-01-09 DIAGNOSIS — S61211A Laceration without foreign body of left index finger without damage to nail, initial encounter: Secondary | ICD-10-CM | POA: Diagnosis not present

## 2019-01-09 NOTE — Progress Notes (Signed)
BP 108/72   Pulse 63   Temp 98.1 F (36.7 C) (Oral)   SpO2 99%    Subjective:    Patient ID: Cindy Jefferson, female    DOB: [birth year], 63 y.o.   MRN: [id number]  HPI: Cindy Jefferson is a 63 y.o. female  Chief Complaint  Patient presents with  . Suture / Staple Removal   Sliced her finger open with a rotary blade while sewing. Had sutures. Still very tender.   Relevant past medical, surgical, family and social history reviewed and updated as indicated. Interim medical history since our last visit reviewed. Allergies and medications reviewed and updated.  Review of Systems  Constitutional: Negative.   Respiratory: Negative.   Cardiovascular: Negative.   Musculoskeletal: Negative.   Skin: Positive for wound. Negative for color change, pallor and rash.  Neurological: Negative.   Psychiatric/Behavioral: Negative.     Per HPI unless specifically indicated above     Objective:    BP 108/72   Pulse 63   Temp 98.1 F (36.7 C) (Oral)   SpO2 99%   Wt Readings from Last 3 Encounters:  01/01/19 164 lb (74.4 kg)  10/22/18 173 lb (78.5 kg)  10/20/18 175 lb (79.4 kg)    Physical Exam Vitals signs and nursing note reviewed.  Constitutional:      General: She is not in acute distress.    Appearance: Normal appearance. She is not ill-appearing, toxic-appearing or diaphoretic.  HENT:     Head: Normocephalic and atraumatic.     Right Ear: External ear normal.     Left Ear: External ear normal.     Nose: Nose normal.     Mouth/Throat:     Mouth: Mucous membranes are moist.     Pharynx: Oropharynx is clear.  Eyes:     General: No scleral icterus.       Right eye: No discharge.        Left eye: No discharge.     Extraocular Movements: Extraocular movements intact.     Conjunctiva/sclera: Conjunctivae normal.     Pupils: Pupils are equal, round, and reactive to light.  Neck:     Musculoskeletal: Normal range of motion and neck supple.  Cardiovascular:     Rate and  Rhythm: Normal rate and regular rhythm.     Pulses: Normal pulses.     Heart sounds: Normal heart sounds. No murmur. No friction rub. No gallop.   Pulmonary:     Effort: Pulmonary effort is normal. No respiratory distress.     Breath sounds: Normal breath sounds. No stridor. No wheezing, rhonchi or rales.  Chest:     Chest wall: No tenderness.  Musculoskeletal: Normal range of motion.  Skin:    General: Skin is warm and dry.     Capillary Refill: Capillary refill takes less than 2 seconds.     Coloration: Skin is not jaundiced or pale.     Findings: No bruising, erythema, lesion or rash.     Comments: Well approximated laceration L index finger, no redness or heat. 5 sutures  Neurological:     General: No focal deficit present.     Mental Status: She is alert and oriented to person, place, and time. Mental status is at baseline.  Psychiatric:        Mood and Affect: Mood normal.        Behavior: Behavior normal.        Thought Content: Thought content normal.  Judgment: Judgment normal.     Results for orders placed or performed in visit on 03/31/18  CULTURE, URINE COMPREHENSIVE  Result Value Ref Range   Urine Culture, Comprehensive Final report    Organism ID, Bacteria Comment   Microscopic Examination  Result Value Ref Range   WBC, UA 0-5 0 - 5 /hpf   RBC, UA 0-2 0 - 2 /hpf   Epithelial Cells (non renal) 0-10 0 - 10 /hpf   Bacteria, UA Few (A) None seen/Few  Urinalysis, Complete  Result Value Ref Range   Specific Gravity, UA 1.020 1.005 - 1.030   pH, UA 5.5 5.0 - 7.5   Color, UA Yellow Yellow   Appearance Ur Clear Clear   Leukocytes, UA Negative Negative   Protein, UA Negative Negative/Trace   Glucose, UA Negative Negative   Ketones, UA Negative Negative   RBC, UA Trace (A) Negative   Bilirubin, UA Negative Negative   Urobilinogen, Ur 0.2 0.2 - 1.0 mg/dL   Nitrite, UA Negative Negative   Microscopic Examination See below:       Assessment & Plan:    Problem List Items Addressed This Visit    None    Visit Diagnoses    Laceration of left index finger without foreign body without damage to nail, initial encounter    -  Primary   Healing well. Sutures removed and dermabond and steristrips applied.   Visit for suture removal       5 sutures removed and dermabond and steristrips applied.        Follow up plan: Return if symptoms worsen or fail to improve.

## 2019-01-14 ENCOUNTER — Other Ambulatory Visit: Payer: Self-pay | Admitting: Nurse Practitioner

## 2019-01-14 NOTE — Telephone Encounter (Signed)
Requested medication (s) are due for refill today: yes  Requested medication (s) are on the active medication list: yes  Last refill:  09/28/2018  Future visit scheduled: yes  Notes to clinic:  Historical provider     Requested Prescriptions  Pending Prescriptions Disp Refills   famotidine (PEPCID) 20 MG tablet [Pharmacy Med Name: FAMOTIDINE 20 MG TABLET] 90 tablet 1    Sig: TAKE 1 TABLET (20 MG TOTAL) BY MOUTH ONCE DAILY DISCONTINUE ZANTAC     Gastroenterology:  H2 Antagonists Passed - 01/14/2019 12:48 PM      Passed - Valid encounter within last 12 months    Recent Outpatient Visits          5 days ago Laceration of left index finger without foreign body without damage to nail, initial encounter   [place], Megan P, DO   2 months ago Chronic migraine without aura without status migrainosus, not intractable   [place], Lochbuie T, NP   4 months ago Acquired hypothyroidism   [place], Barbaraann Faster, NP      Future Appointments            In 2 weeks Cannady, Barbaraann Faster, NP [facility]   In 70 months MacDiarmid, Nicki Reaper, [place]

## 2019-01-27 DIAGNOSIS — L570 Actinic keratosis: Secondary | ICD-10-CM | POA: Diagnosis not present

## 2019-01-27 DIAGNOSIS — Z808 Family history of malignant neoplasm of other organs or systems: Secondary | ICD-10-CM | POA: Diagnosis not present

## 2019-01-27 DIAGNOSIS — L814 Other melanin hyperpigmentation: Secondary | ICD-10-CM | POA: Diagnosis not present

## 2019-01-28 ENCOUNTER — Ambulatory Visit: Payer: BLUE CROSS/BLUE SHIELD | Admitting: Nurse Practitioner

## 2019-01-28 ENCOUNTER — Encounter: Payer: Self-pay | Admitting: Nurse Practitioner

## 2019-01-28 ENCOUNTER — Ambulatory Visit
Admission: RE | Admit: 2019-01-28 | Discharge: 2019-01-28 | Disposition: A | Discharge status: Home / Self Care | Payer: BLUE CROSS/BLUE SHIELD | Attending: Nurse Practitioner | Admitting: Nurse Practitioner

## 2019-01-28 ENCOUNTER — Other Ambulatory Visit: Payer: Self-pay

## 2019-01-28 ENCOUNTER — Ambulatory Visit
Admission: RE | Admit: 2019-01-28 | Discharge: 2019-01-28 | Disposition: A | Discharge status: Home / Self Care | Payer: BLUE CROSS/BLUE SHIELD | Source: Ambulatory Visit | Attending: Nurse Practitioner | Admitting: Nurse Practitioner

## 2019-01-28 VITALS — BP 116/77 | HR 73 | Temp 98.0°F | Ht 66.93 in | Wt 161.0 lb

## 2019-01-28 DIAGNOSIS — Z1239 Encounter for other screening for malignant neoplasm of breast: Secondary | ICD-10-CM

## 2019-01-28 DIAGNOSIS — M79652 Pain in left thigh: Secondary | ICD-10-CM

## 2019-01-28 DIAGNOSIS — E039 Hypothyroidism, unspecified: Secondary | ICD-10-CM

## 2019-01-28 DIAGNOSIS — Z1159 Encounter for screening for other viral diseases: Secondary | ICD-10-CM

## 2019-01-28 DIAGNOSIS — M47816 Spondylosis without myelopathy or radiculopathy, lumbar region: Secondary | ICD-10-CM | POA: Diagnosis not present

## 2019-01-28 DIAGNOSIS — Z0000 Encounter for general adult medical examination without abnormal findings: Secondary | ICD-10-CM

## 2019-01-28 DIAGNOSIS — E663 Overweight: Secondary | ICD-10-CM | POA: Diagnosis not present

## 2019-01-28 DIAGNOSIS — M79605 Pain in left leg: Secondary | ICD-10-CM | POA: Diagnosis not present

## 2019-01-28 DIAGNOSIS — Z1211 Encounter for screening for malignant neoplasm of colon: Secondary | ICD-10-CM

## 2019-01-28 DIAGNOSIS — Z114 Encounter for screening for human immunodeficiency virus [HIV]: Secondary | ICD-10-CM

## 2019-01-28 DIAGNOSIS — K219 Gastro-esophageal reflux disease without esophagitis: Secondary | ICD-10-CM

## 2019-01-28 DIAGNOSIS — E669 Obesity, unspecified: Secondary | ICD-10-CM | POA: Insufficient documentation

## 2019-01-28 NOTE — Assessment & Plan Note (Signed)
Recheck thyroid level today and adjust medication as needed,

## 2019-01-28 NOTE — Assessment & Plan Note (Signed)
Recommend continued focus on health diet choices and regular physical activity (30 minutes 5 days a week). 

## 2019-01-28 NOTE — Progress Notes (Signed)
BP 116/77   Pulse 73   Temp 98 F (36.7 C) (Oral)   Ht 5' 6.93" (1.7 m)   Wt 161 lb (73 kg)   SpO2 99%   BMI 25.27 kg/m    Subjective:    Patient ID: Cindy Jefferson, female    DOB: 10/24/1955, 63 y.o.   MRN: [id number]  HPI: Cindy Jefferson is a 63 y.o. female presenting on 01/28/2019 for comprehensive medical examination. Current medical complaints include: left thigh pain  She currently lives with: husband and mother-in-law Menopausal Symptoms: no   HYPOTHYROIDISM Thyroid control status:controlled Satisfied with current treatment? yes Medication side effects: no Medication compliance: good compliance Etiology of hypothyroidism:  Recent dose adjustment:no Fatigue: no Cold intolerance: no Heat intolerance: no Weight gain: no Weight loss: no Constipation: no Diarrhea/loose stools: no Palpitations: no Lower extremity edema: no Anxiety/depressed mood: no   GERD Has used less reflux medicine since starting occasional fasting. GERD control status: stable  Satisfied with current treatment? yes Heartburn frequency:  Medication side effects: no  Medication compliance: stable Previous GERD medications: Antacid use frequency:   Duration:  Nature:  Location:  Heartburn duration:  Alleviatiating factors:   Aggravating factors:  Dysphagia: no Odynophagia:  no Hematemesis: no Blood in stool: no EGD: no   LEFT UPPER THIGH PAIN: Starting mid-April had left upper thigh pain noted at night when she laid down.  Reports the pain as a deep burning discomfort, would try to move it around for relief and it would "just stab".  At times now she notices it when sitting down in chair.  Reports pain more to lateral aspect of thigh and occasionally top. At worst pain is a 9/10, but lately she reports it as 4/10.  Has been placing pillow between legs at night which helps.  Was mainly a back sleeper, but now sleeps on side with pillow.  States this has helped it be "not as bad as it  was".  Has not taken any medications for discomfort.  States recently has had left hip pain occasionally with this.   Depression Screen done today and results listed below:  Depression screen [facility] 2/9 01/28/2019 09/15/2018  Decreased Interest 0 0  Down, Depressed, Hopeless 0 0  PHQ - 2 Score 0 0  Altered sleeping 0 0  Tired, decreased energy 0 0  Change in appetite 0 0  Feeling bad or failure about yourself  0 0  Trouble concentrating 0 0  Moving slowly or fidgety/restless 0 0  Suicidal thoughts 0 0  PHQ-9 Score 0 0  Difficult doing work/chores Not difficult at all Not difficult at all    The patient does not have a history of falls. I did not complete a risk assessment for falls. A plan of care for falls was not documented.   Past Medical History:  Past Medical History:  Diagnosis Date  . GERD (gastroesophageal reflux disease)   . Migraine   . Migraines   . Pancreatitis   . Thyroid disease     Surgical History:  Past Surgical History:  Procedure Laterality Date  . ABDOMINAL HYSTERECTOMY     total, does not get pap smears   . CHOLECYSTECTOMY    . FOOT SURGERY    . neck fusion      Medications:  Current Outpatient Medications on File Prior to Visit  Medication Sig  . Calcium-Cholecalciferol (CALCET PETITES) 200-250 MG-UNIT TABS Take by mouth.  . Cholecalciferol (VITAMIN D3) 2000 units capsule Take  by mouth.  . diclofenac sodium (VOLTAREN) 1 % GEL Apply 2 g topically as needed (four times a day as needed for neck pain).  Marland Kitchen eletriptan (RELPAX) 40 MG tablet Take 1 tablet (40 mg total) by mouth every 2 (two) hours as needed for migraine or headache.  . famotidine (PEPCID) 20 MG tablet TAKE 1 TABLET (20 MG TOTAL) BY MOUTH ONCE DAILY DISCONTINUE ZANTAC  . levothyroxine (SYNTHROID, LEVOTHROID) 75 MCG tablet Take 75 mcg by mouth daily.  . magnesium oxide (MAG-OX) 400 MG tablet Take by mouth.  . mometasone (NASONEX) 50 MCG/ACT nasal spray Place into the nose.   No current  facility-administered medications on file prior to visit.     Allergies:  Allergies  Allergen Reactions  . Valproic Acid Other (See Comments)    Other reaction(s): Other (See Comments) pancreatitis  pancreatitis    . Tape     Other reaction(s): Unknown Other reaction(s): BURNS SKIN Other reaction(s): BURNS SKIN   . Erythromycin Nausea Only    Social History:  Social History   Socioeconomic History  . Marital status: Married    Spouse name: Not on file  . Number of children: 2  . Years of education: Not on file  . Highest education level: Not on file  Occupational History  . Occupation: retired  Scientific laboratory technician  . Financial resource strain: Not hard at all  . Food insecurity:    Worry: Never true    Inability: Never true  . Transportation needs:    Medical: No    Non-medical: No  Tobacco Use  . Smoking status: Never Smoker  . Smokeless tobacco: Never Used  Substance and Sexual Activity  . Alcohol use: Not Currently  . Drug use: Never  . Sexual activity: Yes  Lifestyle  . Physical activity:    Days per week: 4 days    Minutes per session: 30 min  . Stress: To some extent  Relationships  . Social connections:    Talks on phone: More than three times a week    Gets together: More than three times a week    Attends religious service: Never    Active member of club or organization: No    Attends meetings of clubs or organizations: Never    Relationship status: Married  . Intimate partner violence:    Fear of current or ex partner: No    Emotionally abused: No    Physically abused: No    Forced sexual activity: No  Other Topics Concern  . Not on file  Social History Narrative  . Not on file   Social History   Tobacco Use  Smoking Status Never Smoker  Smokeless Tobacco Never Used   Social History   Substance and Sexual Activity  Alcohol Use Not Currently    Family History:  Family History  Problem Relation Age of Onset  . Hyperlipidemia Mother    . Heart disease Mother   . Lung cancer Mother   . Leukemia Father   . Arthritis Sister   . Thyroid disease Sister   . Colon cancer Maternal Grandfather   . Lupus Daughter   . Kidney cancer Neg Hx     Past medical history, surgical history, medications, allergies, family history and social history reviewed with patient today and changes made to appropriate areas of the chart.   Review of Systems - Negative except thigh discomfort All other ROS negative except what is listed above and in the HPI.  Objective:    BP 116/77   Pulse 73   Temp 98 F (36.7 C) (Oral)   Ht 5' 6.93" (1.7 m)   Wt 161 lb (73 kg)   SpO2 99%   BMI 25.27 kg/m   Wt Readings from Last 3 Encounters:  01/28/19 161 lb (73 kg)  01/01/19 164 lb (74.4 kg)  10/22/18 173 lb (78.5 kg)    Physical Exam Vitals signs and nursing note reviewed.  Constitutional:      General: She is awake. She is not in acute distress.    Appearance: She is well-developed. She is not ill-appearing.  HENT:     Head: Normocephalic.     Right Ear: Hearing, tympanic membrane, ear canal and external ear normal.     Left Ear: Hearing, tympanic membrane, ear canal and external ear normal.     Nose: Nose normal.     Mouth/Throat:     Mouth: Mucous membranes are moist.  Eyes:     General: Lids are normal.        Right eye: No discharge.        Left eye: No discharge.     Extraocular Movements: Extraocular movements intact.     Conjunctiva/sclera: Conjunctivae normal.     Pupils: Pupils are equal, round, and reactive to light.     Visual Fields: Right eye visual fields normal and left eye visual fields normal.  Neck:     Musculoskeletal: Normal range of motion and neck supple.     Thyroid: No thyromegaly.     Vascular: No carotid bruit.  Cardiovascular:     Rate and Rhythm: Normal rate and regular rhythm.     Heart sounds: Normal heart sounds. No murmur. No gallop.   Pulmonary:     Effort: Pulmonary effort is normal.      Breath sounds: Normal breath sounds.  Chest:     Breasts:        Right: Normal. No swelling, bleeding, inverted nipple, mass, nipple discharge, skin change or tenderness.        Left: Normal. No swelling, bleeding, inverted nipple, mass, nipple discharge, skin change or tenderness.  Abdominal:     General: Bowel sounds are normal.     Palpations: Abdomen is soft. There is no hepatomegaly or splenomegaly.  Musculoskeletal:     Right hip: She exhibits tenderness. She exhibits normal range of motion, normal strength and no crepitus.     Left hip: She exhibits normal range of motion, normal strength, no tenderness, no swelling and no laceration.     Right lower leg: No edema.     Left lower leg: No edema.     Comments: Mild discomfort with external rotation left hip, reported numbness and pain with this.  Lymphadenopathy:     Cervical: No cervical adenopathy.     Upper Body:     Right upper body: No supraclavicular, axillary or pectoral adenopathy.     Left upper body: No supraclavicular, axillary or pectoral adenopathy.  Skin:    General: Skin is warm and dry.  Neurological:     Mental Status: She is alert and oriented to person, place, and time.     Cranial Nerves: Cranial nerves are intact.     Gait: Gait is intact.     Deep Tendon Reflexes: Reflexes are normal and symmetric.     Comments: Sensation intact to thighs bilaterally.  Psychiatric:        Attention and Perception: Attention normal.  Mood and Affect: Mood normal.        Behavior: Behavior normal. Behavior is cooperative.        Thought Content: Thought content normal.        Judgment: Judgment normal.     Results for orders placed or performed in visit on 03/31/18  CULTURE, URINE COMPREHENSIVE  Result Value Ref Range   Urine Culture, Comprehensive Final report    Organism ID, Bacteria Comment   Microscopic Examination  Result Value Ref Range   WBC, UA 0-5 0 - 5 /hpf   RBC, UA 0-2 0 - 2 /hpf   Epithelial  Cells (non renal) 0-10 0 - 10 /hpf   Bacteria, UA Few (A) None seen/Few  Urinalysis, Complete  Result Value Ref Range   Specific Gravity, UA 1.020 1.005 - 1.030   pH, UA 5.5 5.0 - 7.5   Color, UA Yellow Yellow   Appearance Ur Clear Clear   Leukocytes, UA Negative Negative   Protein, UA Negative Negative/Trace   Glucose, UA Negative Negative   Ketones, UA Negative Negative   RBC, UA Trace (A) Negative   Bilirubin, UA Negative Negative   Urobilinogen, Ur 0.2 0.2 - 1.0 mg/dL   Nitrite, UA Negative Negative   Microscopic Examination See below:       Assessment & Plan:   Problem List Items Addressed This Visit      Digestive   GERD without esophagitis    Chronic, ongoing.  Continue current medication regimen and adjust dose as needed. Mag level next visit.        Endocrine   Acquired hypothyroidism    Recheck thyroid level today and adjust medication as needed,      Relevant Orders   Thyroid Panel With TSH     Other   Overweight (BMI 25.0-29.9)    Recommend continued focus on health diet choices and regular physical activity (30 minutes 5 days a week).      Left thigh pain   Relevant Orders   DG Lumbar Spine Complete   DG Hip Unilat W OR W/O Pelvis 2-3 Views Left    Other Visit Diagnoses    Encounter for annual physical exam    -  Primary   Relevant Orders   CBC with Differential/Platelet   Comprehensive metabolic panel   VITAMIN D 25 Hydroxy (Vit-D Deficiency, Fractures)   Lipid Panel w/o Chol/HDL Ratio   Breast cancer screening       Relevant Orders   MM DIGITAL SCREENING BILATERAL   Need for hepatitis C screening test       Added to annualk labs   Relevant Orders   Hepatitis C Antibody   Encounter for screening for HIV       Added to annual labs   Relevant Orders   HIV antibody (with reflex)   Colon cancer screening       Screeing ordered   Relevant Orders   Ambulatory referral to Gastroenterology       Follow up plan: Return in about 6 months  (around 07/31/2019) for Hypothyroid  .   LABORATORY TESTING:  - Pap smear: not applicable  IMMUNIZATIONS:   - Tdap: Tetanus vaccination status reviewed: last tetanus booster within 10 years. - Influenza: Up to date - Pneumovax: Not applicable - Prevnar: Not applicable - HPV: Not applicable - Zostavax vaccine: Up to date  SCREENING: -Mammogram: Ordered today  - Colonoscopy: Ordered today  - Bone Density: Not applicable  -Hearing Test: Not applicable  -  Spirometry: Not applicable   PATIENT COUNSELING:   Advised to take 1 mg of folate supplement per day if capable of pregnancy.   Sexuality: Discussed sexually transmitted diseases, partner selection, use of condoms, avoidance of unintended pregnancy  and contraceptive alternatives.   Advised to avoid cigarette smoking.  I discussed with the patient that most people either abstain from alcohol or drink within safe limits (<=14/week and <=4 drinks/occasion for males, <=7/weeks and <= 3 drinks/occasion for females) and that the risk for alcohol disorders and other health effects rises proportionally with the number of drinks per week and how often a drinker exceeds daily limits.  Discussed cessation/primary prevention of drug use and availability of treatment for abuse.   Diet: Encouraged to adjust caloric intake to maintain  or achieve ideal body weight, to reduce intake of dietary saturated fat and total fat, to limit sodium intake by avoiding high sodium foods and not adding table salt, and to maintain adequate dietary potassium and calcium preferably from fresh fruits, vegetables, and low-fat dairy products.    stressed the importance of regular exercise  Injury prevention: Discussed safety belts, safety helmets, smoke detector, smoking near bedding or upholstery.   Dental health: Discussed importance of regular tooth brushing, flossing, and dental visits.    NEXT PREVENTATIVE PHYSICAL DUE IN 1 YEAR. Return in about 6 months  (around 07/31/2019) for Hypothyroid  .

## 2019-01-28 NOTE — Patient Instructions (Signed)
Neuropathic Pain Neuropathic pain is pain caused by damage to the nerves that are responsible for certain sensations in your body (sensory nerves). The pain can be caused by:  Damage to the sensory nerves that send signals to your spinal cord and brain (peripheral nervous system).  Damage to the sensory nerves in your brain or spinal cord (central nervous system). Neuropathic pain can make you more sensitive to pain. Even a minor sensation can feel very painful. This is usually a long-term condition that can be difficult to treat. The type of pain differs from person to person. It may:  Start suddenly (acute), or it may develop slowly and last for a long time (chronic).  Come and go as damaged nerves heal, or it may stay at the same level for years.  Cause emotional distress, loss of sleep, and a lower quality of life. What are the causes? The most common cause of this condition is diabetes. Many other diseases and conditions can also cause neuropathic pain. Causes of neuropathic pain can be classified as:  Toxic. This is caused by medicines and chemicals. The most common cause of toxic neuropathic pain is damage from cancer treatments (chemotherapy).  Metabolic. This can be caused by: ? Diabetes. This is the most common disease that damages the nerves. ? Lack of vitamin B from long-term alcohol abuse.  Traumatic. Any injury that cuts, crushes, or stretches a nerve can cause damage and pain. A common example is feeling pain after losing an arm or leg (phantom limb pain).  Compression-related. If a sensory nerve gets trapped or compressed for a long period of time, the blood supply to the nerve can be cut off.  Vascular. Many blood vessel diseases can cause neuropathic pain by decreasing blood supply and oxygen to nerves.  Autoimmune. This type of pain results from diseases in which the body's defense system (immune system) mistakenly attacks sensory nerves. Examples of autoimmune diseases  that can cause neuropathic pain include lupus and multiple sclerosis.  Infectious. Many types of viral infections can damage sensory nerves and cause pain. Shingles infection is a common cause of this type of pain.  Inherited. Neuropathic pain can be a symptom of many diseases that are passed down through families (genetic). What increases the risk? You are more likely to develop this condition if:  You have diabetes.  You smoke.  You drink too much alcohol.  You are taking certain medicines, including medicines that kill cancer cells (chemotherapy) or that treat immune system disorders. What are the signs or symptoms? The main symptom is pain. Neuropathic pain is often described as:  Burning.  Shock-like.  Stinging.  Hot or cold.  Itching. How is this diagnosed? No single test can diagnose neuropathic pain. It is diagnosed based on:  Physical exam and your symptoms. Your health care provider will ask you about your pain. You may be asked to use a pain scale to describe how bad your pain is.  Tests. These may be done to see if you have a high sensitivity to pain and to help find the cause and location of any sensory nerve damage. They include: ? Nerve conduction studies to test how well nerve signals travel through your sensory nerves (electrodiagnostic testing). ? Stimulating your sensory nerves through electrodes on your skin and measuring the response in your spinal cord and brain (somatosensory evoked potential).  Imaging studies, such as: ? X-rays. ? CT scan. ? MRI. How is this treated? Treatment for neuropathic pain may change   over time. You may need to try different treatment options or a combination of treatments. Some options include:  Treating the underlying cause of the neuropathy, such as diabetes, kidney disease, or vitamin deficiencies.  Stopping medicines that can cause neuropathy, such as chemotherapy.  Medicine to relieve pain. Medicines may include: ?  Prescription or over-the-counter pain medicine. ? Anti-seizure medicine. ? Antidepressant medicines. ? Pain-relieving patches that are applied to painful areas of skin. ? A medicine to numb the area (local anesthetic), which can be injected as a nerve block.  Transcutaneous nerve stimulation. This uses electrical currents to block painful nerve signals. The treatment is painless.  Alternative treatments, such as: ? Acupuncture. ? Meditation. ? Massage. ? Physical therapy. ? Pain management programs. ? Counseling. Follow these instructions at home: Medicines   Take over-the-counter and prescription medicines only as told by your health care provider.  Do not drive or use heavy machinery while taking prescription pain medicine.  If you are taking prescription pain medicine, take actions to prevent or treat constipation. Your health care provider may recommend that you: ? Drink enough fluid to keep your urine pale yellow. ? Eat foods that are high in fiber, such as fresh fruits and vegetables, whole grains, and beans. ? Limit foods that are high in fat and processed sugars, such as fried or sweet foods. ? Take an over-the-counter or prescription medicine for constipation. Lifestyle   Have a good support system at home.  Consider joining a chronic pain support group.  Do not use any products that contain nicotine or tobacco, such as cigarettes and e-cigarettes. If you need help quitting, ask your health care provider.  Do not drink alcohol. General instructions  Learn as much as you can about your condition.  Work closely with all your health care providers to find the treatment plan that works best for you.  Ask your health care provider what activities are safe for you.  Keep all follow-up visits as told by your health care provider. This is important. Contact a health care provider if:  Your pain treatments are not working.  You are having side effects from your  medicines.  You are struggling with tiredness (fatigue), mood changes, depression, or anxiety. Summary  Neuropathic pain is pain caused by damage to the nerves that are responsible for certain sensations in your body (sensory nerves).  Neuropathic pain may come and go as damaged nerves heal, or it may stay at the same level for years.  Neuropathic pain is usually a long-term condition that can be difficult to treat. Consider joining a chronic pain support group. This information is not intended to replace advice given to you by your health care provider. Make sure you discuss any questions you have with your health care provider. Document Released: 05/17/2004 Document Revised: 09/06/2017 Document Reviewed: 09/06/2017 Elsevier Interactive Patient Education  2019 [employer]  

## 2019-01-28 NOTE — Assessment & Plan Note (Signed)
Chronic, ongoing.  Continue current medication regimen and adjust dose as needed. Mag level next visit.

## 2019-01-29 LAB — CBC WITH DIFFERENTIAL/PLATELET
Basophils Absolute: 0.1 10*3/uL (ref 0.0–0.2)
Basos: 2 %
EOS (ABSOLUTE): 0.1 10*3/uL (ref 0.0–0.4)
Eos: 2 %
Hematocrit: 43.7 % (ref 34.0–46.6)
Hemoglobin: 14.5 g/dL (ref 11.1–15.9)
Immature Grans (Abs): 0 10*3/uL (ref 0.0–0.1)
Immature Granulocytes: 0 %
Lymphocytes Absolute: 1.4 10*3/uL (ref 0.7–3.1)
Lymphs: 34 %
MCH: 28.9 pg (ref 26.6–33.0)
MCHC: 33.2 g/dL (ref 31.5–35.7)
MCV: 87 fL (ref 79–97)
Monocytes Absolute: 0.3 10*3/uL (ref 0.1–0.9)
Monocytes: 8 %
Neutrophils Absolute: 2.2 10*3/uL (ref 1.4–7.0)
Neutrophils: 54 %
Platelets: 229 10*3/uL (ref 150–450)
RBC: 5.01 x10E6/uL (ref 3.77–5.28)
RDW: 12.9 % (ref 11.7–15.4)
WBC: 4.1 10*3/uL (ref 3.4–10.8)

## 2019-01-29 LAB — COMPREHENSIVE METABOLIC PANEL
ALT: 16 IU/L (ref 0–32)
AST: 25 IU/L (ref 0–40)
Albumin/Globulin Ratio: 2.2 (ref 1.2–2.2)
Albumin: 5 g/dL — ABNORMAL HIGH (ref 3.8–4.8)
Alkaline Phosphatase: 81 IU/L (ref 39–117)
BUN/Creatinine Ratio: 13 (ref 12–28)
BUN: 13 mg/dL (ref 8–27)
Bilirubin Total: 0.5 mg/dL (ref 0.0–1.2)
CO2: 24 mmol/L (ref 20–29)
Calcium: 9.9 mg/dL (ref 8.7–10.3)
Chloride: 102 mmol/L (ref 96–106)
Creatinine, Ser: 0.97 mg/dL (ref 0.57–1.00)
GFR calc Af Amer: 72 mL/min/{1.73_m2} (ref >59)
GFR calc non Af Amer: 63 mL/min/{1.73_m2} (ref >59)
Globulin, Total: 2.3 g/dL (ref 1.5–4.5)
Glucose: 81 mg/dL (ref 65–99)
Potassium: 4.6 mmol/L (ref 3.5–5.2)
Sodium: 144 mmol/L (ref 134–144)
Total Protein: 7.3 g/dL (ref 6.0–8.5)

## 2019-01-29 LAB — LIPID PANEL W/O CHOL/HDL RATIO
Cholesterol, Total: 225 mg/dL — ABNORMAL HIGH (ref 100–199)
HDL: 82 mg/dL (ref >39)
LDL Calculated: 125 mg/dL — ABNORMAL HIGH (ref 0–99)
Triglycerides: 89 mg/dL (ref 0–149)
VLDL Cholesterol Cal: 18 mg/dL (ref 5–40)

## 2019-01-29 LAB — THYROID PANEL WITH TSH
Free Thyroxine Index: 2.6 (ref 1.2–4.9)
T3 Uptake Ratio: 25 % (ref 24–39)
T4, Total: 10.4 ug/dL (ref 4.5–12.0)
TSH: 2.23 u[IU]/mL (ref 0.450–4.500)

## 2019-01-29 LAB — HIV ANTIBODY (ROUTINE TESTING W REFLEX): HIV Screen 4th Generation wRfx: NONREACTIVE

## 2019-01-29 LAB — HEPATITIS C ANTIBODY: Hep C Virus Ab: <0.1 s/co ratio (ref 0.0–0.9)

## 2019-01-29 LAB — VITAMIN D 25 HYDROXY (VIT D DEFICIENCY, FRACTURES): Vit D, 25-Hydroxy: 28.4 ng/mL — ABNORMAL LOW (ref 30.0–100.0)

## 2019-01-29 NOTE — Addendum Note (Signed)
Addended by: Imraan Wendell T on: 01/29/2019 01:26 PM   Modules accepted: Orders  

## 2019-02-03 ENCOUNTER — Telehealth: Payer: Self-pay

## 2019-02-03 NOTE — Telephone Encounter (Signed)
Sent to referral coordinators.

## 2019-02-24 ENCOUNTER — Other Ambulatory Visit: Payer: Self-pay

## 2019-02-24 ENCOUNTER — Ambulatory Visit: Payer: BC Managed Care – PPO | Attending: Nurse Practitioner

## 2019-02-24 DIAGNOSIS — G8929 Other chronic pain: Secondary | ICD-10-CM

## 2019-02-24 DIAGNOSIS — M79652 Pain in left thigh: Secondary | ICD-10-CM | POA: Insufficient documentation

## 2019-02-24 DIAGNOSIS — M25552 Pain in left hip: Secondary | ICD-10-CM | POA: Diagnosis not present

## 2019-02-24 DIAGNOSIS — M545 Low back pain, unspecified: Secondary | ICD-10-CM

## 2019-02-24 DIAGNOSIS — M6281 Muscle weakness (generalized): Secondary | ICD-10-CM | POA: Diagnosis not present

## 2019-02-24 NOTE — Therapy (Signed)
[facility] [address] [place], [address] Phone: [phone]   Fax:  [phone]  Physical Therapy Evaluation  Patient Details  Name: Cindy Jefferson MRN: [id number] Date of Birth: 08-01-1956 Referring Provider (PT): Marnee Guarneri, NP   Encounter Date: 02/24/2019  PT End of Session - 02/24/19 1507    Visit Number  1    Number of Visits  13    Date for PT Re-Evaluation  04/09/19    Authorization Type  1    Authorization Time Period  40 visits per year    PT Start Time  1508    PT Stop Time  1626    PT Time Calculation (min)  78 min    Activity Tolerance  Patient tolerated treatment well    Behavior During Therapy  [facility] for tasks assessed/performed       Past Medical History:  Diagnosis Date  . GERD (gastroesophageal reflux disease)   . Migraine   . Migraines   . Pancreatitis   . Thyroid disease     Past Surgical History:  Procedure Laterality Date  . ABDOMINAL HYSTERECTOMY     total, does not get pap smears   . CHOLECYSTECTOMY    . FOOT SURGERY    . neck fusion      There were no vitals filed for this visit.   Subjective Assessment - 02/24/19 1512    Subjective  Low back pain: 5/10 currently (pt sitting), 8/10 low back pain at worst for the past month, 0/10 at best (getting up and moving around in the morning). L thigh pain: 1/10 currently (pt sitting), 10/10 at most for the past month, 0/10 at best    Pertinent History  L thigh pain.  Pain located L lateral thigh (L L5 dermatome). Feels a deep buring. Pain began in the beginning of March 2020. Started laying on her R side and put a pillow between her knees which helped. Used to be able to walk 5 miles a day. Low back, L lateral hip would also hurt after a lot of walking. Also feels L anterior hip pain (at joint) when pt stand up after sitting. L thigh pain is worst at night. Wearing supportive shoes instead of flip flops help. Has had low  back pain since she was around 63 years old. Has also had mid back and neck problems since she was a teenager.  Had fusion at C5/6 and C6/7 in 2003. Has not had PT for back or thigh before. Had severe tailbone pain 4-5 years ago. Went to PT which made it worse. Went to a spine center at [facility] and was sent to PT focusing on the McKenzie method and has not had the tail bone pain since then. Pain does not go past her knees. Able to walk 1 mile without pain.    Patient Stated Goals  Be able to walk as much as she wants without pain.    Currently in Pain?  Yes    Pain Score  5     Pain Onset  More than a month ago    Pain Frequency  Constant    Aggravating Factors   Sitting on her L hip, laying flat on her back, supine L hip abduction, bending over in standing, walking greater than a mile.    Pain Relieving Factors  R S/L with pillow between knees, wearing supportive shoes         [facility] PT  Assessment - 02/24/19 1528      Assessment   Medical Diagnosis  L thigh pain    Referring Provider (PT)  Marnee Guarneri, NP    Onset Date/Surgical Date  11/02/18   beginning of March 2020   Prior Therapy  No known PT for current condition      Precautions   Precaution Comments  No known precautions      Restrictions   Other Position/Activity Restrictions  No known restrictions      Prior Function   Vocation Requirements  Better able to tolerate sitting, supine and bending over with less back pain      Observation/Other Assessments   Observations  Decrease low back pain when feet went from being together to shoulder width apart as well as with L lateral shift correction.  (+) Slump L LE and R LE for low back pain. Decreased L hip IR compared to R in prone B hip IR. (-) long sit test.  (-) L piriformis test. (+) FABER test L hip      Posture/Postural Control   Posture Comments  protracted neck, B protracted shoulders, R scapular winging, L lateral shift, greater trochanter and L knee lower, L foot  pronation. L lateral shift      AROM   Overall AROM Comments  Back extension: R posterior and L posterior quadrant WLF and decreased L thigh pain.     Lumbar Flexion  WFL with reproduction of back pain    Lumbar Extension  WFL with decrease in pain    Lumbar - Right Side Bend  limited; no pain wiht over over pressure    Lumbar - Left Side Bend  limited with low back pain and L lateral thigh symptoms     Lumbar - Right Rotation  WFL, decreased L thigh symptoms     Lumbar - Left Rotation  WFL, decreased L thigh symptoms       PROM   Overall PROM Comments  supine hip at 90/90 IR: 32 degrees L with reproduction of L anterior hip pain; R hip 37 degrees       Strength   Right Hip Flexion  4-/5    Right Hip Extension  4-/5    Right Hip External Rotation   4/5    Right Hip ABduction  4/5    Left Hip Flexion  3+/5   with reproduction of anterior hip pain   Left Hip Extension  4-/5    Left Hip External Rotation  4-/5    Left Hip ABduction  4-/5    Right Knee Flexion  4+/5    Right Knee Extension  5/5    Left Knee Flexion  4+/5    Left Knee Extension  5/5      Palpation   Palpation comment  TTP B lateral hips, L > R. TTP B low back L > R. TTP L posterior hip around piriformis L > R. No TTP B posterior thighs.       Ambulation/Gait   Gait Comments  increased L foot pronation during L LE stance phase of gait                 Objective measurements completed on examination: See above findings.   Standing back extension eases back pain   Pt has osteoporosis.    Therapeutic exercise  Sitting with lumbar towel roll. No change in pain but feels good for support.   R posterior quadrant extension. Decreased pain initially. Symptoms  returned after 6th repetition of 6x5 seconds L posterior quadrant extension.Decreased pain initially. Symptoms returned after 6th repetition of 6x5 seconds  supine L hip IR stretch. Increased L lateral thigh tingling  Supine L SKTC   Decreased L  lateral thigh tingling initially but returned at 5th repetition   Prone on elbows: increased tingling L lateral thigh   Standing L lateral shift correction 5x5 seconds for 3 sets   No increase in symptoms. Decreased sitting back pain   Seated hip adduction pillow squeeze 5x5 seconds for 2 sets, then 4x5 seconds   Increased L lateral thigh symptoms    Try isometric hip abduction/clamshells next visit if appropriate    Improved exercise technique, movement at target joints, use of target muscles after mod verbal, visual, tactile cues.   Patient is a 63 year old female who came to physical therapy secondary to low back, L hip, and L lateral thigh pain and symptoms. She also presents with altered posture, primary reproduction of symptoms with lumbar flexion and L side bending, neural tension, decreased L hip IR ROM, B hip weakness, positive special test for L hip involvement as well, and difficulty performing movements which involve bending over, walking greater than a mile, and difficulty tolerating positions such as supine, or sitting due to pain. Pt will benefit from skilled physical therapy services to address the aforementioned deficits.       PT Education - 02/24/19 1637    Education Details  ther-ex, plan of care    Person(s) Educated  Patient    Methods  Explanation;Demonstration;Tactile cues;Verbal cues    Comprehension  Returned demonstration;Verbalized understanding           PT Long Term Goals - 02/24/19 1646      PT LONG TERM GOAL #1   Title  Patient will have a decrease in low back pain to 3/10 or less at worst to improve ability to ambulate longer distances, pick up items from the floor, and sleep with less discomfort.    Baseline  8/10 low back pain at worst for the past month (02/24/2019)    Time  6    Period  Weeks    Status  New    Target Date  04/09/19      PT LONG TERM GOAL #2   Title  Patient will have a decrease in L lateral thigh pain to 5/10 or less at  worst to improve ability to ambulate longer distances, tolerate sitting longer, and sleep more comfortably.    Baseline  10/10 L lateral thigh pain at worst for the past month (02/24/2019)    Time  6    Period  Weeks    Status  New    Target Date  04/09/19      PT LONG TERM GOAL #3   Title  Patient will improve B glute med and max strength to help decrease back pain as well as improve ability to perform standing tasks and sleep more comfortably.    Time  6    Period  Weeks    Status  New    Target Date  04/09/19             Plan - 02/24/19 1638    Clinical Impression Statement  Patient is a 63 year old female who came to physical therapy secondary to low back, L hip, and L lateral thigh pain and symptoms. She also presents with altered posture, primary reproduction of symptoms with lumbar flexion and  L side bending, neural tension, decreased L hip IR ROM, B hip weakness, positive special test for L hip involvement as well, and difficulty performing movements which involve bending over, walking greater than a mile, and difficulty tolerating positions such as supine, or sitting due to pain. Pt will benefit from skilled physical therapy services to address the aforementioned deficits.    Personal Factors and Comorbidities  Age;Past/Current Experience;Time since onset of injury/illness/exacerbation   history of pain in tail bone   Examination-Activity Limitations  Lift;Sit;Sleep;Stand;Carry    Stability/Clinical Decision Making  Stable/Uncomplicated    Clinical Decision Making  Low    Rehab Potential  Fair    PT Frequency  2x / week    PT Duration  6 weeks    PT Treatment/Interventions  Aquatic Therapy;Electrical Stimulation;Iontophoresis 4mg /ml Dexamethasone;Traction;Ultrasound;Gait training;Stair training;Therapeutic activities;Therapeutic exercise;Balance training;Neuromuscular re-education;Patient/family education;Manual techniques;Dry needling   traction if appropriate   PT Next  Visit Plan  posture, trunk and hip strengthening, lumbopelvic control, hip ROM, manual techniques, modalities PRN    Consulted and Agree with Plan of Care  Patient       Patient will benefit from skilled therapeutic intervention in order to improve the following deficits and impairments:  Abnormal gait, Decreased range of motion, Decreased strength, Difficulty walking, Improper body mechanics, Postural dysfunction, Pain  Visit Diagnosis: 1. Pain in left thigh   2. Chronic bilateral low back pain, unspecified whether sciatica present   3. Pain in left hip   4. Muscle weakness (generalized)        Problem List Patient Active Problem List   Diagnosis Date Noted  . Left thigh pain 01/28/2019  . Neck pain 10/22/2018  . Migraines 09/15/2018  . Overweight (BMI 25.0-29.9) 09/15/2018  . At high risk for caregiver role strain 09/15/2018  . 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 Acquired hypothyroidism 01/10/2018  . GERD without esophagitis 01/10/2018  . Osteoporosis 01/10/2018    Joneen Boers PT, DPT   02/24/2019, 6:33 PM  [facility] [address], [place], [address] Phone: [phone]   Fax:  [phone]  Name: Cindy Jefferson MRN: [id number] Date of Birth: Jul 03, 1956

## 2019-02-26 ENCOUNTER — Ambulatory Visit: Payer: BC Managed Care – PPO

## 2019-02-26 ENCOUNTER — Other Ambulatory Visit: Payer: Self-pay

## 2019-02-26 DIAGNOSIS — G8929 Other chronic pain: Secondary | ICD-10-CM

## 2019-02-26 DIAGNOSIS — M6281 Muscle weakness (generalized): Secondary | ICD-10-CM | POA: Diagnosis not present

## 2019-02-26 DIAGNOSIS — M25552 Pain in left hip: Secondary | ICD-10-CM | POA: Diagnosis not present

## 2019-02-26 DIAGNOSIS — M79652 Pain in left thigh: Secondary | ICD-10-CM | POA: Diagnosis not present

## 2019-02-26 DIAGNOSIS — M545 Low back pain: Secondary | ICD-10-CM | POA: Diagnosis not present

## 2019-02-26 NOTE — Patient Instructions (Signed)
[employer] Access Code: [email or web address]   Prone glute max set 10x3 with 5 seconds

## 2019-02-26 NOTE — Therapy (Signed)
[facility] [address] [place], [address] Phone: [phone]   Fax:  [phone]  Physical Therapy Treatment  Patient Details  Name: Cindy Jefferson MRN: [id number] Date of Birth: April 09, 1956 Referring Provider (PT): Marnee Guarneri, NP   Encounter Date: 02/26/2019  PT End of Session - 02/26/19 1501    Visit Number  2    Number of Visits  13    Date for PT Re-Evaluation  04/09/19    Authorization Type  2    Authorization Time Period  40 visits per year    PT Start Time  1501    PT Stop Time  1547    PT Time Calculation (min)  46 min    Activity Tolerance  Patient tolerated treatment well    Behavior During Therapy  [facility] for tasks assessed/performed       Past Medical History:  Diagnosis Date  . GERD (gastroesophageal reflux disease)   . Migraine   . Migraines   . Pancreatitis   . Thyroid disease     Past Surgical History:  Procedure Laterality Date  . ABDOMINAL HYSTERECTOMY     total, does not get pap smears   . CHOLECYSTECTOMY    . FOOT SURGERY    . neck fusion      There were no vitals filed for this visit.  Subjective Assessment - 02/26/19 1502    Subjective  Back does not bother her much but her thigh is giving her a fit. Does not know what she has done. 1/10 back pain, 5/10 L lateral thigh pain currently. Back bothered her after last session, took tylenol and used ice. L thigh was ok. Mainly noticed L thigh pain last night while in bed. Has not been bad today until a couple of hours ago. Pt walked half a mile to and from her mail box earlier. Felt L lateral thigh pain when pt sat down in her car.    Pertinent History  L thigh pain.  Pain located L lateral thigh (L L5 dermatome). Feels a deep buring. Pain began in the beginning of March 2020. Started laying on her R side and put a pillow between her knees which helped. Used to be able to walk 5 miles a day. Low back, L lateral hip would also  hurt after a lot of walking. Also feels L anterior hip pain (at joint) when pt stand up after sitting. L thigh pain is worst at night. Wearing supportive shoes instead of flip flops help. Has had low back pain since she was around 63 years old. Has also had mid back and neck problems since she was a teenager.  Had fusion at C5/6 and C6/7 in 2003. Has not had PT for back or thigh before. Had severe tailbone pain 4-5 years ago. Went to PT which made it worse. Went to a spine center at [facility] and was sent to PT focusing on the McKenzie method and has not had the tail bone pain since then. Pain does not go past her knees. Able to walk 1 mile without pain.    Patient Stated Goals  Be able to walk as much as she wants without pain.    Currently in Pain?  Yes    Pain Score  5    L lateral thigh   Pain Onset  More than a month ago  PT Education - 02/26/19 1507    Education Details  ther-ex    Person(s) Educated  Patient    Methods  Explanation;Demonstration;Tactile cues;Verbal cues    Comprehension  Returned demonstration;Verbalized understanding      Objective   Standing back extension eases back pain   Pt has osteoporosis.    Medbridge Access Code: [email or web address]  Therapeutic exercise  Sitting with lumbar towel roll.  Decreased L lateral thigh stabbing pain.   Seated transversus abdominis contraction   10x5 seconds. Then 3x5 seconds. Increased L lateral thigh stabbing sensation which eases with rest  Seated scapular retraction 10x3 with 5 second holds   Seated manually resisted trunk extension in neutral 10x5 seconds for 3 sets  Prone position (forehead resting on forearms) with deep breaths 1 minute to promote gentle low back extension   Increased L LE tingling  Decreased with pillow under abdomen  Prone glute max set 10x5 seconds each LE for 3 sets   Centralization observed (pain only went down half of her thigh)  Prone on  elbows 1 minute  Prone press ups 10x  Then with R trunk rotation 5x3  Then with L trunk rotation 5x3   No L lateral thigh pain afterwards in prone  Supine SKTC 10x2 seconds each LE  Increased L lateral thigh symptoms  With B ankle DF and shoulder extension isometric: Mini bridge 10x Regular bridge 10x2  Decreased L lateral thigh pain/discomfort to 2/10  Seated anterior pelvic tilt 10x2  No change in L lateral thigh symptoms   Try isometric hip abduction/clamshells next visit if appropriate  Improved exercise technique, movement at target joints, use of target muscles after mod verbal, visual, tactile cues.   Response to treatment Good muscle use felt with exercises. Centralization of symptoms with prone glute max set and press-ups. Decreased L lateral thigh pain to 2/10 after session.    Clinical Impression Decreased L lateral thigh symptoms with gentle prone low back extension movements and glute max muscle activation. 2/10 L lateral thigh pain after session. Pt demonstrates better symptom centralization when performing extension in prone compared to upright position. Pt will benefit from continued skilled physical therapy services to decrease pain, improve strength and function.          PT Short Term Goals - 02/24/19 1643      PT SHORT TERM GOAL #1   Title  Patient will be independent with her HEP to decrease pain, improve strength and function.    Time  3    Period  Weeks    Status  New    Target Date  03/19/19        PT Long Term Goals - 02/24/19 1646      PT LONG TERM GOAL #1   Title  Patient will have a decrease in low back pain to 3/10 or less at worst to improve ability to ambulate longer distances, pick up items from the floor, and sleep with less discomfort.    Baseline  8/10 low back pain at worst for the past month (02/24/2019)    Time  6    Period  Weeks    Status  New    Target Date  04/09/19      PT LONG TERM GOAL #2   Title  Patient will  have a decrease in L lateral thigh pain to 5/10 or less at worst to improve ability to ambulate longer distances, tolerate sitting longer, and sleep more comfortably.    Baseline  10/10 L lateral thigh pain at worst for the past month (02/24/2019)    Time  6    Period  Weeks    Status  New    Target Date  04/09/19      PT LONG TERM GOAL #3   Title  Patient will improve B glute med and max strength to help decrease back pain as well as improve ability to perform standing tasks and sleep more comfortably.    Time  6    Period  Weeks    Status  New    Target Date  04/09/19            Plan - 02/26/19 1559    Clinical Impression Statement  Decreased L lateral thigh symptoms with gentle prone low back extension movements and glute max muscle activation. 2/10 L lateral thigh pain after session. Pt demonstrates better symptom centralization when performing extension in prone compared to upright position. Pt will benefit from continued skilled physical therapy services to decrease pain, improve strength and function.    Personal Factors and Comorbidities  Age;Past/Current Experience;Time since onset of injury/illness/exacerbation   history of pain in tail bone   Examination-Activity Limitations  Lift;Sit;Sleep;Stand;Carry    Stability/Clinical Decision Making  Stable/Uncomplicated    Rehab Potential  Fair    PT Frequency  2x / week    PT Duration  6 weeks    PT Treatment/Interventions  Aquatic Therapy;Electrical Stimulation;Iontophoresis 4mg /ml Dexamethasone;Traction;Ultrasound;Gait training;Stair training;Therapeutic activities;Therapeutic exercise;Balance training;Neuromuscular re-education;Patient/family education;Manual techniques;Dry needling   traction if appropriate   PT Next Visit Plan  posture, trunk and hip strengthening, lumbopelvic control, hip ROM, manual techniques, modalities PRN    Consulted and Agree with Plan of Care  Patient       Patient will benefit from skilled  therapeutic intervention in order to improve the following deficits and impairments:  Abnormal gait, Decreased range of motion, Decreased strength, Difficulty walking, Improper body mechanics, Postural dysfunction, Pain  Visit Diagnosis: 1. Chronic bilateral low back pain, unspecified whether sciatica present   2. Pain in left hip   3. Pain in left thigh   4. Muscle weakness (generalized)        Problem List Patient Active Problem List   Diagnosis Date Noted  . Left thigh pain 01/28/2019  . Neck pain 10/22/2018  . Migraines 09/15/2018  . Overweight (BMI 25.0-29.9) 09/15/2018  . At high risk for caregiver role strain 09/15/2018  . 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 Acquired hypothyroidism 01/10/2018  . GERD without esophagitis 01/10/2018  . Osteoporosis 01/10/2018    Joneen Boers PT, DPT   02/26/2019, 4:03 PM  [facility] [address] [place], [address] Phone: [phone]   Fax:  [phone]  Name: Cindy Jefferson MRN: [id number] Date of Birth: 1955-11-19

## 2019-03-03 ENCOUNTER — Ambulatory Visit: Payer: BC Managed Care – PPO

## 2019-03-03 ENCOUNTER — Other Ambulatory Visit: Payer: Self-pay

## 2019-03-03 DIAGNOSIS — M25552 Pain in left hip: Secondary | ICD-10-CM | POA: Diagnosis not present

## 2019-03-03 DIAGNOSIS — M545 Low back pain: Secondary | ICD-10-CM | POA: Diagnosis not present

## 2019-03-03 DIAGNOSIS — M6281 Muscle weakness (generalized): Secondary | ICD-10-CM | POA: Diagnosis not present

## 2019-03-03 DIAGNOSIS — G8929 Other chronic pain: Secondary | ICD-10-CM | POA: Diagnosis not present

## 2019-03-03 DIAGNOSIS — M79652 Pain in left thigh: Secondary | ICD-10-CM | POA: Diagnosis not present

## 2019-03-03 NOTE — Patient Instructions (Addendum)
Medbridge Access Code: [email or web address]   S/L hip abduction  10x2 each LE  Prone press-ups 10x3  Prone Off Center Lumbar Extension Press Up (to the R)   3x5

## 2019-03-03 NOTE — Therapy (Signed)
[facility] [address], [place], [address] Phone: [phone]   Fax:  [phone]  Physical Therapy Treatment  Patient Details  Name: Cindy Jefferson MRN: [id number] Date of Birth: [birth year] Referring Provider (PT): Marnee Guarneri, NP   Encounter Date: 03/03/2019  PT End of Session - 03/03/19 3009    Visit Number  3    Number of Visits  13    Date for PT Re-Evaluation  04/09/19    Authorization Type  3    Authorization Time Period  40 visits per year    PT Start Time  0904    PT Stop Time  0947    PT Time Calculation (min)  43 min    Activity Tolerance  Patient tolerated treatment well    Behavior During Therapy  [facility] for tasks assessed/performed       Past Medical History:  Diagnosis Date  . GERD (gastroesophageal reflux disease)   . Migraine   . Migraines   . Pancreatitis   . Thyroid disease     Past Surgical History:  Procedure Laterality Date  . ABDOMINAL HYSTERECTOMY     total, does not get pap smears   . CHOLECYSTECTOMY    . FOOT SURGERY    . neck fusion      There were no vitals filed for this visit.  Subjective Assessment - 03/03/19 0905    Subjective  L thigh is feeling fine. Her low back is 4-5/10.  Was good after last session. The tingling in her L lateral thigh has subsided. Walking to and from her mailbox is fine. Walked 2 miles yesterday without L lateral thigh tingling. Had back pain afterwards.  Still feels a bruised feeling but no tingling.    Pertinent History  L thigh pain.  Pain located L lateral thigh (L L5 dermatome). Feels a deep buring. Pain began in the beginning of March 2020. Started laying on her R side and put a pillow between her knees which helped. Used to be able to walk 5 miles a day. Low back, L lateral hip would also hurt after a lot of walking. Also feels L anterior hip pain (at joint) when pt stand up after sitting. L thigh pain is worst at night. Wearing  supportive shoes instead of flip flops help. Has had low back pain since she was around 63 years old. Has also had mid back and neck problems since she was a teenager.  Had fusion at C5/6 and C6/7 in 2003. Has not had PT for back or thigh before. Had severe tailbone pain 4-5 years ago. Went to PT which made it worse. Went to a spine center at [facility] and was sent to PT focusing on the McKenzie method and has not had the tail bone pain since then. Pain does not go past her knees. Able to walk 1 mile without pain.    Patient Stated Goals  Be able to walk as much as she wants without pain.    Currently in Pain?  Yes    Pain Score  5    low back   Pain Onset  More than a month ago                               PT Education - 03/03/19 0956    Education Details  ther-ex, HEP    Person(s) Educated  Patient  Methods  Explanation;Demonstration;Tactile cues;Verbal cues;Handout    Comprehension  Returned demonstration;Verbalized understanding      Objective   Standing back extension eases back pain   Pt has osteoporosis.    Medbridge Access Code: [email or web address]  Therapeutic exercise   Prone glute max set 10x5 seconds each LE for 2 sets (pillow under abdomen during second set) to review HEP per pt request  Neutral and camel (no cat) 10x3    Hooklying B shoulder flexion to promote thoracic extension 10x3 with 5 second holds   Supine clamshell isometrics belt resistance at neutral 10x5 seconds for 3 sets  With B ankle DF, shoulder extension isometric and clamshell isometric in neutral:  bridge 10x2  S/L hip abduction   R 10x2  L 10x2  Prone press ups 10x2. Low back tinlign  Off center press-ups (R lateral shift position to counter L lateral shift) 5x3. No tingling in low back afterwards.   No pain or symptoms after aforementioned exercises.     Seated manually resisted trunk extension in neutral 10x5 seconds for 3 sets   Improved exercise technique,  movement at target joints, use of target muscles after min to mod verbal, visual, tactile cues.      Response to treatment Good muscle use felt with exercises. No back pain or tingling aftersession.  Clinical Impression Continued working on gentle lumbar extension and thoracic extension as well as glute and trunk strengthening. No low back pain or tingling after session. Pt demonstrates centralization of symptoms based on no complains of L thigh pain or tingling. Pt making progress with PT towards goals. Pt will benefit from continued skilled physical therapy services to decrease pain, improve strength and function.      PT Short Term Goals - 02/24/19 1643      PT SHORT TERM GOAL #1   Title  Patient will be independent with her HEP to decrease pain, improve strength and function.    Time  3    Period  Weeks    Status  New    Target Date  03/19/19        PT Long Term Goals - 02/24/19 1646      PT LONG TERM GOAL #1   Title  Patient will have a decrease in low back pain to 3/10 or less at worst to improve ability to ambulate longer distances, pick up items from the floor, and sleep with less discomfort.    Baseline  8/10 low back pain at worst for the past month (02/24/2019)    Time  6    Period  Weeks    Status  New    Target Date  04/09/19      PT LONG TERM GOAL #2   Title  Patient will have a decrease in L lateral thigh pain to 5/10 or less at worst to improve ability to ambulate longer distances, tolerate sitting longer, and sleep more comfortably.    Baseline  10/10 L lateral thigh pain at worst for the past month (02/24/2019)    Time  6    Period  Weeks    Status  New    Target Date  04/09/19      PT LONG TERM GOAL #3   Title  Patient will improve B glute med and max strength to help decrease back pain as well as improve ability to perform standing tasks and sleep more comfortably.    Time  6    Period  Weeks  Status  New    Target Date  04/09/19             Plan - 03/03/19 0900    Clinical Impression Statement  Continued working on gentle lumbar extension and thoracic extension as well as glute and trunk strengthening. No low back pain or tingling after session. Pt demonstrates centralization of symptoms based on no complains of L thigh pain or tingling. Pt making progress with PT towards goals. Pt will benefit from continued skilled physical therapy services to decrease pain, improve strength and function.    Personal Factors and Comorbidities  Age;Past/Current Experience;Time since onset of injury/illness/exacerbation   history of pain in tail bone   Examination-Activity Limitations  Lift;Sit;Sleep;Stand;Carry    Stability/Clinical Decision Making  Stable/Uncomplicated    Rehab Potential  Fair    PT Frequency  2x / week    PT Duration  6 weeks    PT Treatment/Interventions  Aquatic Therapy;Electrical Stimulation;Iontophoresis 4mg /ml Dexamethasone;Traction;Ultrasound;Gait training;Stair training;Therapeutic activities;Therapeutic exercise;Balance training;Neuromuscular re-education;Patient/family education;Manual techniques;Dry needling   traction if appropriate   PT Next Visit Plan  posture, trunk and hip strengthening, lumbopelvic control, hip ROM, manual techniques, modalities PRN    Consulted and Agree with Plan of Care  Patient       Patient will benefit from skilled therapeutic intervention in order to improve the following deficits and impairments:  Abnormal gait, Decreased range of motion, Decreased strength, Difficulty walking, Improper body mechanics, Postural dysfunction, Pain  Visit Diagnosis: 1. Chronic bilateral low back pain, unspecified whether sciatica present   2. Pain in left hip   3. Pain in left thigh   4. Muscle weakness (generalized)        Problem List Patient Active Problem List   Diagnosis Date Noted  . Left thigh pain 01/28/2019  . Neck pain 10/22/2018  . Migraines 09/15/2018  . Overweight  (BMI 25.0-29.9) 09/15/2018  . At high risk for caregiver role strain 09/15/2018  . 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 Acquired hypothyroidism 01/10/2018  . GERD without esophagitis 01/10/2018  . Osteoporosis 01/10/2018    Joneen Boers PT, DPT   03/03/2019, 9:58 AM  [facility] [address] [place], [address] Phone: [phone]   Fax:  [phone]  Name: Cindy Jefferson MRN: [id number] Date of Birth: 1956-01-21

## 2019-03-05 ENCOUNTER — Ambulatory Visit: Payer: BC Managed Care – PPO

## 2019-03-09 ENCOUNTER — Ambulatory Visit: Payer: BC Managed Care – PPO

## 2019-03-11 ENCOUNTER — Ambulatory Visit: Payer: BC Managed Care – PPO

## 2019-03-17 ENCOUNTER — Ambulatory Visit: Payer: BC Managed Care – PPO

## 2019-03-18 ENCOUNTER — Other Ambulatory Visit: Payer: Self-pay | Admitting: Nurse Practitioner

## 2019-03-18 DIAGNOSIS — Z20822 Contact with and (suspected) exposure to covid-19: Secondary | ICD-10-CM

## 2019-03-18 DIAGNOSIS — Z20828 Contact with and (suspected) exposure to other viral communicable diseases: Secondary | ICD-10-CM | POA: Diagnosis not present

## 2019-03-18 NOTE — Progress Notes (Signed)
Covid testing.

## 2019-03-19 ENCOUNTER — Ambulatory Visit (INDEPENDENT_AMBULATORY_CARE_PROVIDER_SITE_OTHER): Payer: BC Managed Care – PPO | Admitting: Nurse Practitioner

## 2019-03-19 ENCOUNTER — Other Ambulatory Visit: Payer: Self-pay

## 2019-03-19 ENCOUNTER — Encounter: Payer: Self-pay | Admitting: Nurse Practitioner

## 2019-03-19 ENCOUNTER — Ambulatory Visit: Payer: BC Managed Care – PPO

## 2019-03-19 DIAGNOSIS — J029 Acute pharyngitis, unspecified: Secondary | ICD-10-CM | POA: Insufficient documentation

## 2019-03-19 MED ORDER — PENICILLIN V POTASSIUM 500 MG PO TABS: 500.0000 mg | ORAL_TABLET | Freq: Two times a day (BID) | ORAL | 0 refills | Status: AC

## 2019-03-19 MED ORDER — ONDANSETRON HCL 4 MG PO TABS: 4.0000 mg | ORAL_TABLET | Freq: Three times a day (TID) | ORAL | 0 refills | Status: DC | PRN

## 2019-03-19 NOTE — Patient Instructions (Signed)
Sore Throat When you have a sore throat, your throat may feel:  Tender.  Burning.  Irritated.  Scratchy.  Painful when you swallow.  Painful when you talk. Many things can cause a sore throat, such as:  An infection.  Allergies.  Dry air.  Smoke or pollution.  Radiation treatment.  Gastroesophageal reflux disease (GERD).  A tumor. A sore throat can be the first sign of another sickness. It can happen with other problems, like:  Coughing.  Sneezing.  Fever.  Swelling in the neck. Most sore throats go away without treatment. Follow these instructions at home:      Take over-the-counter medicines only as told by your doctor. ? If your child has a sore throat, do not give your child aspirin.  Drink enough fluids to keep your pee (urine) pale yellow.  Rest when you feel you need to.  To help with pain: ? Sip warm liquids, such as broth, herbal tea, or warm water. ? Eat or drink cold or frozen liquids, such as frozen ice pops. ? Gargle with a salt-water mixture 3-4 times a day or as needed. To make a salt-water mixture, add -1 tsp (3-6 g) of salt to 1 cup (237 mL) of warm water. Mix it until you cannot see the salt anymore. ? Suck on hard candy or throat lozenges. ? Put a cool-mist humidifier in your bedroom at night. ? Sit in the bathroom with the door closed for 5-10 minutes while you run hot water in the shower.  Do not use any products that contain nicotine or tobacco, such as cigarettes, e-cigarettes, and chewing tobacco. If you need help quitting, ask your doctor.  Wash your hands well and often with soap and water. If soap and water are not available, use hand sanitizer. Contact a doctor if:  You have a fever for more than 2-3 days.  You keep having symptoms for more than 2-3 days.  Your throat does not get better in 7 days.  You have a fever and your symptoms suddenly get worse.  Your child who is 3 months to 7 years old has a temperature of  102.24F (39C) or higher. Get help right away if:  You have trouble breathing.  You cannot swallow fluids, soft foods, or your saliva.  You have swelling in your throat or neck that gets worse.  You keep feeling sick to your stomach (nauseous).  You keep throwing up (vomiting). Summary  A sore throat is pain, burning, irritation, or scratchiness in the throat. Many things can cause a sore throat.  Take over-the-counter medicines only as told by your doctor. Do not give your child aspirin.  Drink plenty of fluids, and rest as needed.  Contact a doctor if your symptoms get worse or your sore throat does not get better within 7 days. This information is not intended to replace advice given to you by your health care provider. Make sure you discuss any questions you have with your health care provider. Document Released: 05/29/2008 Document Revised: 01/20/2018 Document Reviewed: 01/20/2018 Elsevier Patient Education  2020 [employer].

## 2019-03-19 NOTE — Progress Notes (Signed)
BP 111/67   Pulse 71   Temp (!) 96.4 F (35.8 C)   Wt 161 lb (73 kg)   BMI 25.27 kg/m    Subjective:    Patient ID: Cindy Jefferson, female    DOB: 1956-05-29, 63 y.o.   MRN: [id number]  HPI: Cindy Jefferson is a 63 y.o. female  Chief Complaint  Patient presents with  . Sore Throat    fatigue    . This visit was completed via WebEx due to the restrictions of the COVID-19 pandemic. All issues as above were discussed and addressed. Physical exam was done as above through visual confirmation on WebEx. If it was felt that the patient should be evaluated in the office, they were directed there. The patient verbally consented to this visit. . Location of the patient: home . Location of the provider: home . Those involved with this call:  . Provider: Marnee Guarneri, DNP . CMA: Gerda Diss, CMA . Front Desk/Registration: Jill Side  . Time spent on call: 15 minutes with patient face to face via video conference. More than 50% of this time was spent in counseling and coordination of care. 10 minutes total spent in review of patient's record and preparation of their chart.  . I verified patient identity using two factors (patient name and date of birth). Patient consents verbally to being seen via telemedicine visit today.    UPPER RESPIRATORY TRACT INFECTION Started with symptoms Tuesday night, sore throat.  Then fatigue began to start yesterday afternoon.  Always wears a mask and washes groceries when comes home + performs hand hygiene.  Has not been around Covid positive patient that she knows of and has not traveled overseas. Fever: no Cough: no Shortness of breath: no Wheezing: no Chest pain: no Chest tightness: no Chest congestion: no Nasal congestion: no Runny nose: no Post nasal drip: no Sneezing: no Sore throat: yes Swollen glands: no Sinus pressure: no Headache: no Face pain: no Toothache: no Ear pain: none Ear pressure: none Eyes red/itching:no Eye  drainage/crusting: no  Vomiting: no Rash: no Fatigue: yes Sick contacts: no Strep contacts: no  Context: fluctuating Recurrent sinusitis: no Relief with OTC cold/cough medications: no  Treatments attempted: salt water gargle and Tylenol   Relevant past medical, surgical, family and social history reviewed and updated as indicated. Interim medical history since our last visit reviewed. Allergies and medications reviewed and updated.  Review of Systems  Constitutional: Positive for fatigue. Negative for activity change, appetite change and fever.  HENT: Positive for sore throat. Negative for congestion, ear discharge, ear pain, facial swelling, postnasal drip, rhinorrhea, sinus pressure, sinus pain, sneezing and voice change.   Eyes: Negative for pain and visual disturbance.  Respiratory: Negative for cough, chest tightness, shortness of breath and wheezing.   Cardiovascular: Negative for chest pain, palpitations and leg swelling.  Gastrointestinal: Negative for abdominal distention, abdominal pain, constipation, diarrhea, nausea and vomiting.  Endocrine: Negative.   Musculoskeletal: Negative for myalgias.  Neurological: Negative for dizziness, numbness and headaches.  Psychiatric/Behavioral: Negative.     Per HPI unless specifically indicated above     Objective:    BP 111/67   Pulse 71   Temp (!) 96.4 F (35.8 C)   Wt 161 lb (73 kg)   BMI 25.27 kg/m   Wt Readings from Last 3 Encounters:  03/19/19 161 lb (73 kg)  01/28/19 161 lb (73 kg)  01/01/19 164 lb (74.4 kg)    Physical Exam Vitals signs  and nursing note reviewed.  Constitutional:      General: She is awake. She is not in acute distress.    Appearance: She is well-developed. She is not ill-appearing.  HENT:     Head: Normocephalic.     Right Ear: Hearing normal. No drainage.     Left Ear: Hearing normal. No drainage.     Nose: No mucosal edema or rhinorrhea.     Right Sinus: No maxillary sinus tenderness or  frontal sinus tenderness.     Left Sinus: No maxillary sinus tenderness or frontal sinus tenderness.     Comments: Self palpated sinuses    Mouth/Throat:     Mouth: Mucous membranes are moist.     Pharynx: Pharyngeal swelling, oropharyngeal exudate and posterior oropharyngeal erythema present.     Tonsils: 1+ on the right. 1+ on the left.     Comments: Viewed via virtual with assistance from patient.  Moderate erythema with slight exudate to left and mild swelling present.  Eyes:     General: Lids are normal.        Right eye: No discharge.        Left eye: No discharge.     Conjunctiva/sclera: Conjunctivae normal.  Neck:     Musculoskeletal: Normal range of motion.  Cardiovascular:     Comments: Unable to auscultate due to virtual exam only  Pulmonary:     Effort: Pulmonary effort is normal. No accessory muscle usage or respiratory distress.     Comments: Unable to auscultate due to virtual exam only  Abdominal:     Tenderness: There is no abdominal tenderness.     Comments: Unable to auscultate due to virtual exam only.  Self palpation reports no tenderness.  Neurological:     Mental Status: She is alert and oriented to person, place, and time.  Psychiatric:        Attention and Perception: Attention normal.        Mood and Affect: Mood normal.        Behavior: Behavior normal. Behavior is cooperative.        Thought Content: Thought content normal.        Judgment: Judgment normal.     Results for orders placed or performed in visit on 01/28/19  HM COLONOSCOPY  Result Value Ref Range   HM Colonoscopy See Report (in chart) See Report (in chart), Patient Reported      Assessment & Plan:   Problem List Items Addressed This Visit      Other   Sore throat    Acute, sent for Covid testing with results pending.    Have recommended maintaining a 14 day at home quarantine at this time based on current symptoms or quarantine until results returned.  Patient agrees with this  POC.  Based on assessment high suspicion for strep, will send script for Pen V x 10 days and Zofran PRN.  To return to office for worsening or continued symptoms.  Discussed at length Covid guidelines.         I discussed the assessment and treatment plan with the patient. The patient was provided an opportunity to ask questions and all were answered. The patient agreed with the plan and demonstrated an understanding of the instructions.   The patient was advised to call back or seek an in-person evaluation if the symptoms worsen or if the condition fails to improve as anticipated.   I provided 15 minutes of time during this encounter.  Follow up plan: Return if symptoms worsen or fail to improve.

## 2019-03-19 NOTE — Assessment & Plan Note (Signed)
Acute, sent for Covid testing with results pending.    Have recommended maintaining a 14 day at home quarantine at this time based on current symptoms or quarantine until results returned.  Patient agrees with this POC.  Based on assessment high suspicion for strep, will send script for Pen V x 10 days and Zofran PRN.  To return to office for worsening or continued symptoms.  Discussed at length Covid guidelines.

## 2019-03-22 LAB — NOVEL CORONAVIRUS, NAA: SARS-CoV-2, NAA: NOT DETECTED

## 2019-04-01 DIAGNOSIS — H43391 Other vitreous opacities, right eye: Secondary | ICD-10-CM | POA: Diagnosis not present

## 2019-04-02 ENCOUNTER — Encounter: Payer: Self-pay | Admitting: Nurse Practitioner

## 2019-04-02 ENCOUNTER — Other Ambulatory Visit: Payer: Self-pay

## 2019-04-02 ENCOUNTER — Ambulatory Visit (INDEPENDENT_AMBULATORY_CARE_PROVIDER_SITE_OTHER): Payer: BC Managed Care – PPO | Admitting: Nurse Practitioner

## 2019-04-02 VITALS — BP 107/67 | HR 67 | Temp 96.6°F | Wt 162.0 lb

## 2019-04-02 DIAGNOSIS — H2513 Age-related nuclear cataract, bilateral: Secondary | ICD-10-CM | POA: Diagnosis not present

## 2019-04-02 DIAGNOSIS — H539 Unspecified visual disturbance: Secondary | ICD-10-CM | POA: Insufficient documentation

## 2019-04-02 DIAGNOSIS — G43709 Chronic migraine without aura, not intractable, without status migrainosus: Secondary | ICD-10-CM

## 2019-04-02 DIAGNOSIS — H43811 Vitreous degeneration, right eye: Secondary | ICD-10-CM | POA: Diagnosis not present

## 2019-04-02 MED ORDER — AMITRIPTYLINE HCL 10 MG PO TABS: 10.0000 mg | ORAL_TABLET | Freq: Every day | ORAL | 2 refills | Status: DC

## 2019-04-02 NOTE — Progress Notes (Signed)
BP 107/67   Pulse 67   Temp (!) 96.6 F (35.9 C) (Oral)   Wt 162 lb (73.5 kg)   BMI 25.43 kg/m    Subjective:    Patient ID: Cindy Jefferson, female    DOB: May 02, 1956, 63 y.o.   MRN: [id number]  HPI: Cindy Jefferson is a 63 y.o. female  Chief Complaint  Patient presents with  . Eye Problem    refer to mychart message from yesterday    . This visit was completed via WebEx due to the restrictions of the COVID-19 pandemic. All issues as above were discussed and addressed. Physical exam was done as above through visual confirmation on WebEx. If it was felt that the patient should be evaluated in the office, they were directed there. The patient verbally consented to this visit. . Location of the patient: home . Location of the provider: home . Those involved with this call:  . Provider: Marnee Guarneri, DNP . CMA: Yvonna Alanis, CMA . Front Desk/Registration: Jill Side  . Time spent on call: 15 minutes with patient face to face via video conference. More than 50% of this time was spent in counseling and coordination of care. 10 minutes total spent in review of patient's record and preparation of their chart.  . I verified patient identity using two factors (patient name and date of birth). Patient consents verbally to being seen via telemedicine visit today.    VISION CHANGES Started Tuesday night, right eye only.  Was seen by [facility] yesterday and vision exam performed with no tears or bleeds noted, optic nerve & macular normal. She is going to see retina specialist at 2:30 pm at [facility] today.  She is scheduled to see Dr. Manuella Ghazi on 05/19/2019, they are going to see if they can get her in sooner.  Is requesting MRI or CT of brain to assess further due to concerns and concerns with 30 years of migraines and new onset of vision changes.  States that it is like a blanket or wearing a dirty contact, smudged.  Can see with right eye, but appears dirty. Foreign body sensation:no  Visual impairment: can see through, like wearing a dirty contact lense Eye redness: no Discharge: no Crusting or matting of eyelids: no Swelling: no Photophobia: no Itching: no Tearing: no Headache: in afternoon she had headache yesterday, but not her typical migraine Floaters: no URI symptoms: no Contact lens use: no Close contacts with similar problems: no Eye trauma: no   MIGRAINES Has had migraines for 30 years or more.  She has diagnosis of migraines, takes occasional Relpax.  Previously had been on Depakote for maintenance, but got pancreatitis with this and stopped.  Gets 5 or more migraines a month for which she used Relpax.   Has never had an aura before, current vision changes are new symptom for her.  At baseline her migraines present to side of head around, often right side.  Will have them for 3 days in a row, 72 hours before relief.    Duration: chronic Onset: gradual Location: often right side of head, but sometimes left Headache duration: 72 hours at baseline Radiation: yes around right eye and side of head when present Time of day headache occurs: varies Alleviating factors: Relpax and occasionally Ibuprofen Aggravating factors: unknown Headache status at time of visit: currently with right vision aura, but know headache Treatments attempted: Relpax, Ibuprofen, and Depakote in past which caused pancreatitis Nausea:  no Vomiting: no Photophobia:  no Phonophobia:  no Effect on social functioning:  yes RED FLAGS: Confusion:  no Gait disturbance/ataxia:  no Behavioral changes:  no Fevers:  no  Relevant past medical, surgical, family and social history reviewed and updated as indicated. Interim medical history since our last visit reviewed. Allergies and medications reviewed and updated.  Review of Systems  Constitutional: Negative for activity change, appetite change, diaphoresis, fatigue and fever.  Eyes: Positive for visual disturbance. Negative for  photophobia, pain, discharge, redness and itching.  Respiratory: Negative for cough, chest tightness and shortness of breath.   Cardiovascular: Negative for chest pain, palpitations and leg swelling.  Gastrointestinal: Negative for abdominal distention, abdominal pain, constipation, diarrhea, nausea and vomiting.  Neurological: Negative for dizziness, seizures, syncope, facial asymmetry, speech difficulty, weakness, light-headedness, numbness and headaches.  Psychiatric/Behavioral: Negative.     Per HPI unless specifically indicated above     Objective:    BP 107/67   Pulse 67   Temp (!) 96.6 F (35.9 C) (Oral)   Wt 162 lb (73.5 kg)   BMI 25.43 kg/m   Wt Readings from Last 3 Encounters:  04/02/19 162 lb (73.5 kg)  03/19/19 161 lb (73 kg)  01/28/19 161 lb (73 kg)    Physical Exam Vitals signs and nursing note reviewed.  Constitutional:      General: She is awake. She is not in acute distress.    Appearance: She is well-developed. She is not ill-appearing.  HENT:     Head: Normocephalic.     Right Ear: Hearing normal.     Left Ear: Hearing normal.  Eyes:     General: Lids are normal.        Right eye: No discharge.        Left eye: No discharge.     Extraocular Movements: Extraocular movements intact.     Conjunctiva/sclera: Conjunctivae normal.     Comments: Unable to to perform full eye exam due to virtual visit only.  Neck:     Musculoskeletal: Normal range of motion.  Cardiovascular:     Comments: Unable to auscultate due to virtual exam only  Pulmonary:     Effort: Pulmonary effort is normal. No accessory muscle usage or respiratory distress.     Comments: Unable to auscultate due to virtual exam only  Neurological:     Mental Status: She is alert and oriented to person, place, and time.     Cranial Nerves: Cranial nerves are intact.  Psychiatric:        Attention and Perception: Attention normal.        Mood and Affect: Mood normal.        Speech: Speech  normal.        Behavior: Behavior normal. Behavior is cooperative.        Thought Content: Thought content normal.        Judgment: Judgment normal.     Results for orders placed or performed in visit on 03/18/19  Novel Coronavirus, NAA ([facility])  Result Value Ref Range   SARS-CoV-2, NAA Not Detected Not Detected      Assessment & Plan:   Problem List Items Addressed This Visit      Cardiovascular and Mediastinum   Migraines - Primary    Chronic, ongoing with 5 + migraines a month and new onset of possible visual aura.  Failed Depakote due to pancreatitis.  Will trial low dose Amitriptyline as maintenance and adjust if tolerated.  Continue Relpax as needed.  CT head  ordered due to new onset vision changes.  Return in 2 weeks or sooner for worsening symptoms.      Relevant Medications   amitriptyline (ELAVIL) 10 MG tablet   Other Relevant Orders   CT Head Wo Contrast     Other   Vision changes    New onset in patient with 30 year history of migraines.  Seen by ophthalmology yesterday and seeing retinal specialist today.  Suspect this is related to migraines, possible visual aura.  She is scheduled to see neurology in September.  Will trial Amitriptyline as maintenance medication and order CT scan due to new onset of visual changes.  Return in 2 weeks or sooner if worsening symptoms.       Relevant Orders   CT Head Wo Contrast     I discussed the assessment and treatment plan with the patient. The patient was provided an opportunity to ask questions and all were answered. The patient agreed with the plan and demonstrated an understanding of the instructions.   The patient was advised to call back or seek an in-person evaluation if the symptoms worsen or if the condition fails to improve as anticipated.   I provided 15 minutes of time during this encounter.   Follow up plan: Return in about 2 weeks (around 04/16/2019).

## 2019-04-02 NOTE — Assessment & Plan Note (Signed)
New onset in patient with 30 year history of migraines.  Seen by ophthalmology yesterday and seeing retinal specialist today.  Suspect this is related to migraines, possible visual aura.  She is scheduled to see neurology in September.  Will trial Amitriptyline as maintenance medication and order CT scan due to new onset of visual changes.  Return in 2 weeks or sooner if worsening symptoms.

## 2019-04-02 NOTE — Assessment & Plan Note (Signed)
Chronic, ongoing with 5 + migraines a month and new onset of possible visual aura.  Failed Depakote due to pancreatitis.  Will trial low dose Amitriptyline as maintenance and adjust if tolerated.  Continue Relpax as needed.  CT head ordered due to new onset vision changes.  Return in 2 weeks or sooner for worsening symptoms.

## 2019-04-02 NOTE — Patient Instructions (Signed)

## 2019-04-03 ENCOUNTER — Ambulatory Visit: Payer: Self-pay | Admitting: Nurse Practitioner

## 2019-04-06 ENCOUNTER — Ambulatory Visit: Payer: Self-pay | Admitting: Nurse Practitioner

## 2019-04-09 ENCOUNTER — Ambulatory Visit
Admission: RE | Admit: 2019-04-09 | Discharge: 2019-04-09 | Disposition: A | Discharge status: Home / Self Care | Payer: BC Managed Care – PPO | Source: Ambulatory Visit | Attending: Nurse Practitioner | Admitting: Nurse Practitioner

## 2019-04-09 DIAGNOSIS — Z1231 Encounter for screening mammogram for malignant neoplasm of breast: Secondary | ICD-10-CM | POA: Diagnosis not present

## 2019-04-09 DIAGNOSIS — Z1239 Encounter for other screening for malignant neoplasm of breast: Secondary | ICD-10-CM

## 2019-04-12 ENCOUNTER — Other Ambulatory Visit: Payer: Self-pay | Admitting: Nurse Practitioner

## 2019-04-12 NOTE — Telephone Encounter (Signed)
Requested Prescriptions  Pending Prescriptions Disp Refills  . eletriptan (RELPAX) 40 MG tablet [Pharmacy Med Name: ELETRIPTAN HBR 40 MG TABLET] 10 tablet 5    Sig: TAKE 1 TABLET (40 MG TOTAL) BY MOUTH EVERY 2 (TWO) HOURS AS NEEDED FOR MIGRAINE OR HEADACHE.     Neurology:  Migraine Therapy - Triptan Passed - 04/12/2019  9:42 AM      Passed - Last BP in normal range    BP Readings from Last 1 Encounters:  04/02/19 107/67         Passed - Valid encounter within last 12 months    Recent Outpatient Visits          1 week ago Chronic migraine without aura without status migrainosus, not intractable   [place], Henrine Screws T, NP   3 weeks ago Sore throat   [place], Valliant T, NP   2 months ago Encounter for annual physical exam   [facility] [place], [place] T, NP   3 months ago Laceration of left index finger without foreign body without damage to nail, initial encounter   [employer], Megan P, DO   5 months ago Chronic migraine without aura without status migrainosus, not intractable   [place], Barbaraann Faster, NP      Future Appointments            In 3 months [place], Barbaraann Faster, NP [employer]   In 28 months MacDiarm[place], Nicki Reaper, [place]

## 2019-04-22 ENCOUNTER — Ambulatory Visit: Payer: Self-pay | Admitting: Nurse Practitioner

## 2019-04-28 ENCOUNTER — Other Ambulatory Visit: Payer: Self-pay | Admitting: Nurse Practitioner

## 2019-05-19 ENCOUNTER — Other Ambulatory Visit: Payer: Self-pay | Admitting: Neurology

## 2019-05-19 DIAGNOSIS — M5090 Cervical disc disorder, unspecified, unspecified cervical region: Secondary | ICD-10-CM

## 2019-05-19 DIAGNOSIS — E559 Vitamin D deficiency, unspecified: Secondary | ICD-10-CM | POA: Diagnosis not present

## 2019-05-19 DIAGNOSIS — E538 Deficiency of other specified B group vitamins: Secondary | ICD-10-CM | POA: Diagnosis not present

## 2019-05-19 DIAGNOSIS — G43119 Migraine with aura, intractable, without status migrainosus: Secondary | ICD-10-CM | POA: Diagnosis not present

## 2019-06-01 ENCOUNTER — Ambulatory Visit
Admission: RE | Admit: 2019-06-01 | Discharge: 2019-06-01 | Disposition: A | Discharge status: Home / Self Care | Payer: BC Managed Care – PPO | Source: Ambulatory Visit | Attending: Neurology | Admitting: Neurology

## 2019-06-01 ENCOUNTER — Other Ambulatory Visit: Payer: Self-pay

## 2019-06-01 DIAGNOSIS — M5090 Cervical disc disorder, unspecified, unspecified cervical region: Secondary | ICD-10-CM | POA: Diagnosis not present

## 2019-06-01 DIAGNOSIS — M50221 Other cervical disc displacement at C4-C5 level: Secondary | ICD-10-CM | POA: Diagnosis not present

## 2019-06-01 LAB — POCT I-STAT CREATININE: Creatinine, Ser: 0.9 mg/dL (ref 0.44–1.00)

## 2019-06-01 MED ORDER — GADOBUTROL 1 MMOL/ML IV SOLN
7.0000 mL | Freq: Once | INTRAVENOUS | Status: AC | PRN
  Administered 2019-06-01: 7 mL via INTRAVENOUS

## 2019-06-03 DIAGNOSIS — G43119 Migraine with aura, intractable, without status migrainosus: Secondary | ICD-10-CM | POA: Insufficient documentation

## 2019-06-04 DIAGNOSIS — H43811 Vitreous degeneration, right eye: Secondary | ICD-10-CM | POA: Diagnosis not present

## 2019-06-04 DIAGNOSIS — H2513 Age-related nuclear cataract, bilateral: Secondary | ICD-10-CM | POA: Diagnosis not present

## 2019-07-01 ENCOUNTER — Other Ambulatory Visit: Payer: Self-pay | Admitting: Nurse Practitioner

## 2019-07-21 ENCOUNTER — Other Ambulatory Visit: Payer: Self-pay

## 2019-07-22 ENCOUNTER — Ambulatory Visit: Payer: BC Managed Care – PPO | Admitting: Family Medicine

## 2019-07-22 ENCOUNTER — Encounter: Payer: Self-pay | Admitting: Nurse Practitioner

## 2019-07-22 ENCOUNTER — Other Ambulatory Visit: Payer: Self-pay

## 2019-07-22 VITALS — BP 116/75 | HR 63 | Temp 98.2°F | Wt 165.0 lb

## 2019-07-22 DIAGNOSIS — M7989 Other specified soft tissue disorders: Secondary | ICD-10-CM | POA: Diagnosis not present

## 2019-07-22 NOTE — Progress Notes (Signed)
BP 116/75   Pulse 63   Temp 98.2 F (36.8 C) (Oral)   Wt 165 lb (74.8 kg)   SpO2 99%   BMI 25.90 kg/m    Subjective:    Patient ID: Cindy Jefferson, female    DOB: 20-Jun-1956, 63 y.o.   MRN: [id number]  HPI: Cindy Jefferson is a 63 y.o. female  Chief Complaint  Patient presents with  . Mass    under right arm. first noticed last Saturday. tender and achy   LUMP Duration: 4-5 days ago Location: under R arm Onset: unsure Painful: no Discomfort: yes Status:  a second one above it yesterday Trauma: no Redness: no Bruising: no Recent infection: no Swollen lymph nodes: no Requesting removal: no  Relevant past medical, surgical, family and social history reviewed and updated as indicated. Interim medical history since our last visit reviewed. Allergies and medications reviewed and updated.  Review of Systems  Constitutional: Negative.   Respiratory: Negative.   Cardiovascular: Negative.   Gastrointestinal: Negative.   Endocrine: Negative.   Hematological: Negative.   Psychiatric/Behavioral: Negative.     Per HPI unless specifically indicated above     Objective:    BP 116/75   Pulse 63   Temp 98.2 F (36.8 C) (Oral)   Wt 165 lb (74.8 kg)   SpO2 99%   BMI 25.90 kg/m   Wt Readings from Last 3 Encounters:  07/22/19 165 lb (74.8 kg)  04/02/19 162 lb (73.5 kg)  03/19/19 161 lb (73 kg)    Physical Exam Vitals signs and nursing note reviewed.  Constitutional:      General: She is not in acute distress.    Appearance: Normal appearance. She is not ill-appearing, toxic-appearing or diaphoretic.  HENT:     Head: Normocephalic and atraumatic.     Right Ear: External ear normal.     Left Ear: External ear normal.     Nose: Nose normal.     Mouth/Throat:     Mouth: Mucous membranes are moist.     Pharynx: Oropharynx is clear.  Eyes:     General: No scleral icterus.       Right eye: No discharge.        Left eye: No discharge.     Extraocular  Movements: Extraocular movements intact.     Conjunctiva/sclera: Conjunctivae normal.     Pupils: Pupils are equal, round, and reactive to light.  Neck:     Musculoskeletal: Normal range of motion and neck supple.  Cardiovascular:     Rate and Rhythm: Normal rate and regular rhythm.     Pulses: Normal pulses.     Heart sounds: Normal heart sounds. No murmur. No friction rub. No gallop.   Pulmonary:     Effort: Pulmonary effort is normal. No respiratory distress.     Breath sounds: Normal breath sounds. No stridor. No wheezing, rhonchi or rales.  Chest:     Chest wall: No tenderness.  Musculoskeletal: Normal range of motion.        General: Swelling (mild swelling, no consolidation, no palpable lymph nodes or mass, no redness, no tenderness to palpation ) present.  Skin:    General: Skin is warm and dry.     Capillary Refill: Capillary refill takes less than 2 seconds.     Coloration: Skin is not jaundiced or pale.     Findings: No bruising, erythema, lesion or rash.  Neurological:     General: No focal deficit present.  Mental Status: She is alert and oriented to person, place, and time. Mental status is at baseline.  Psychiatric:        Mood and Affect: Mood normal.        Behavior: Behavior normal.        Thought Content: Thought content normal.        Judgment: Judgment normal.     Results for orders placed or performed during the hospital encounter of 06/01/19  I-STAT creatinine  Result Value Ref Range   Creatinine, Ser 0.90 0.44 - 1.00 mg/dL      Assessment & Plan:   Problem List Items Addressed This Visit    None    Visit Diagnoses    Swelling in right armpit    -  Primary   Will check CBC. Warm compress. Watch for a couple of days. If not getting better, we will obtain an [place]. Call with any concerns.    Relevant Orders   CBC with Differential OUT       Follow up plan: Return if symptoms worsen or fail to improve.

## 2019-07-23 LAB — CBC WITH DIFFERENTIAL/PLATELET
Basophils Absolute: 0.1 10*3/uL (ref 0.0–0.2)
Basos: 1 %
EOS (ABSOLUTE): 0 10*3/uL (ref 0.0–0.4)
Eos: 1 %
Hematocrit: 45.4 % (ref 34.0–46.6)
Hemoglobin: 14.5 g/dL (ref 11.1–15.9)
Immature Grans (Abs): 0 10*3/uL (ref 0.0–0.1)
Immature Granulocytes: 0 %
Lymphocytes Absolute: 1.7 10*3/uL (ref 0.7–3.1)
Lymphs: 37 %
MCH: 28.4 pg (ref 26.6–33.0)
MCHC: 31.9 g/dL (ref 31.5–35.7)
MCV: 89 fL (ref 79–97)
Monocytes Absolute: 0.4 10*3/uL (ref 0.1–0.9)
Monocytes: 8 %
Neutrophils Absolute: 2.4 10*3/uL (ref 1.4–7.0)
Neutrophils: 53 %
Platelets: 276 10*3/uL (ref 150–450)
RBC: 5.1 x10E6/uL (ref 3.77–5.28)
RDW: 12.1 % (ref 11.7–15.4)
WBC: 4.6 10*3/uL (ref 3.4–10.8)

## 2019-07-24 ENCOUNTER — Telehealth: Payer: Self-pay

## 2019-07-24 NOTE — Telephone Encounter (Signed)
Patient notified

## 2019-07-24 NOTE — Telephone Encounter (Signed)
They were normal and have been released to her mychart.

## 2019-07-24 NOTE — Telephone Encounter (Signed)
Copied from [place] [id number]. Topic: General - Inquiry >> Jul 24, 2019 12:30 PM Berneta Levins wrote: Reason for CRM:   Pt calling to get lab results.   Routing to provider for results.

## 2019-08-06 ENCOUNTER — Ambulatory Visit: Payer: BLUE CROSS/BLUE SHIELD | Admitting: Nurse Practitioner

## 2019-08-09 DIAGNOSIS — Z20828 Contact with and (suspected) exposure to other viral communicable diseases: Secondary | ICD-10-CM | POA: Diagnosis not present

## 2019-09-17 ENCOUNTER — Other Ambulatory Visit: Payer: Self-pay | Admitting: Nurse Practitioner

## 2019-09-17 ENCOUNTER — Encounter: Payer: Self-pay | Admitting: Nurse Practitioner

## 2019-09-17 ENCOUNTER — Telehealth: Payer: Self-pay | Admitting: Nurse Practitioner

## 2019-09-17 DIAGNOSIS — E782 Mixed hyperlipidemia: Secondary | ICD-10-CM | POA: Insufficient documentation

## 2019-09-17 DIAGNOSIS — E7800 Pure hypercholesterolemia, unspecified: Secondary | ICD-10-CM

## 2019-09-17 DIAGNOSIS — M810 Age-related osteoporosis without current pathological fracture: Secondary | ICD-10-CM

## 2019-09-17 DIAGNOSIS — E039 Hypothyroidism, unspecified: Secondary | ICD-10-CM

## 2019-09-17 HISTORY — DX: Mixed hyperlipidemia: E78.2

## 2019-09-17 NOTE — Telephone Encounter (Signed)
Pt stated she wanted blood work before her appointment  tomorow and  I didn't see any orders.Pt stated she had nothing to talk about if she didn't have blood work before her appointment.

## 2019-09-17 NOTE — Telephone Encounter (Signed)
I have placed lab order for her if she can obtain these today.

## 2019-09-17 NOTE — Telephone Encounter (Signed)
Pt understood and verbalized understanding.  

## 2019-09-18 ENCOUNTER — Other Ambulatory Visit: Payer: Self-pay

## 2019-09-18 ENCOUNTER — Other Ambulatory Visit: Payer: BC Managed Care – PPO

## 2019-09-18 ENCOUNTER — Ambulatory Visit: Payer: BLUE CROSS/BLUE SHIELD | Admitting: Nurse Practitioner

## 2019-09-18 DIAGNOSIS — E039 Hypothyroidism, unspecified: Secondary | ICD-10-CM

## 2019-09-18 DIAGNOSIS — E7800 Pure hypercholesterolemia, unspecified: Secondary | ICD-10-CM | POA: Diagnosis not present

## 2019-09-18 DIAGNOSIS — E038 Other specified hypothyroidism: Secondary | ICD-10-CM | POA: Diagnosis not present

## 2019-09-18 DIAGNOSIS — M810 Age-related osteoporosis without current pathological fracture: Secondary | ICD-10-CM | POA: Diagnosis not present

## 2019-09-19 LAB — COMPREHENSIVE METABOLIC PANEL
ALT: 23 IU/L (ref 0–32)
AST: 24 IU/L (ref 0–40)
Albumin/Globulin Ratio: 1.6 (ref 1.2–2.2)
Albumin: 4.4 g/dL (ref 3.8–4.8)
Alkaline Phosphatase: 96 IU/L (ref 39–117)
BUN/Creatinine Ratio: 18 (ref 12–28)
BUN: 16 mg/dL (ref 8–27)
Bilirubin Total: 0.6 mg/dL (ref 0.0–1.2)
CO2: 26 mmol/L (ref 20–29)
Calcium: 9.4 mg/dL (ref 8.7–10.3)
Chloride: 102 mmol/L (ref 96–106)
Creatinine, Ser: 0.9 mg/dL (ref 0.57–1.00)
GFR calc Af Amer: 79 mL/min/{1.73_m2} (ref >59)
GFR calc non Af Amer: 68 mL/min/{1.73_m2} (ref >59)
Globulin, Total: 2.8 g/dL (ref 1.5–4.5)
Glucose: 81 mg/dL (ref 65–99)
Potassium: 4.5 mmol/L (ref 3.5–5.2)
Sodium: 140 mmol/L (ref 134–144)
Total Protein: 7.2 g/dL (ref 6.0–8.5)

## 2019-09-19 LAB — THYROID PANEL WITH TSH
Free Thyroxine Index: 2.2 (ref 1.2–4.9)
T3 Uptake Ratio: 24 % (ref 24–39)
T4, Total: 9 ug/dL (ref 4.5–12.0)
TSH: 2.79 u[IU]/mL (ref 0.450–4.500)

## 2019-09-19 LAB — LIPID PANEL W/O CHOL/HDL RATIO
Cholesterol, Total: 233 mg/dL — ABNORMAL HIGH (ref 100–199)
HDL: 82 mg/dL (ref >39)
LDL Chol Calc (NIH): 131 mg/dL — ABNORMAL HIGH (ref 0–99)
Triglycerides: 115 mg/dL (ref 0–149)
VLDL Cholesterol Cal: 20 mg/dL (ref 5–40)

## 2019-09-19 LAB — VITAMIN D 25 HYDROXY (VIT D DEFICIENCY, FRACTURES): Vit D, 25-Hydroxy: 29 ng/mL — ABNORMAL LOW (ref 30.0–100.0)

## 2019-09-19 NOTE — Progress Notes (Signed)
Contacted via MyChart The 10-year ASCVD risk score Mikey Bussing DC Jr., et al., 2013) is: 3.3%   Values used to calculate the score:     Age: 64 years     Sex: Female     Is Non-Hispanic African American: No     Diabetic: No     Tobacco smoker: No     Systolic Blood Pressure: 99991111 mmHg     Is BP treated: No     HDL Cholesterol: 82 mg/dL     Total Cholesterol: 233 mg/dL

## 2019-09-24 ENCOUNTER — Ambulatory Visit (INDEPENDENT_AMBULATORY_CARE_PROVIDER_SITE_OTHER): Payer: BC Managed Care – PPO | Admitting: Nurse Practitioner

## 2019-09-24 ENCOUNTER — Encounter: Payer: Self-pay | Admitting: Nurse Practitioner

## 2019-09-24 VITALS — Temp 97.6°F | Wt 166.0 lb

## 2019-09-24 DIAGNOSIS — M810 Age-related osteoporosis without current pathological fracture: Secondary | ICD-10-CM

## 2019-09-24 DIAGNOSIS — E039 Hypothyroidism, unspecified: Secondary | ICD-10-CM

## 2019-09-24 DIAGNOSIS — G43119 Migraine with aura, intractable, without status migrainosus: Secondary | ICD-10-CM

## 2019-09-24 DIAGNOSIS — E7800 Pure hypercholesterolemia, unspecified: Secondary | ICD-10-CM | POA: Diagnosis not present

## 2019-09-24 NOTE — Assessment & Plan Note (Signed)
Ongoing, with ASCVD 3.3%.  Continue focus on diet regimen, discussed at length with her.  She may trial red yeast rice.  Poor tolerance to fish oil due to GERD.   Return in 5 months for annual physical.

## 2019-09-24 NOTE — Assessment & Plan Note (Signed)
Chronic, stable at this time with improvement with addition of Rimegepant.  Continue current medication regimen and collaboration with neurology.  She has history of pancreatitis with medications used for migraines, will need to be cautious with treatment.  Return in 5 months for annual physical.

## 2019-09-24 NOTE — Assessment & Plan Note (Signed)
Chronic, stable with TSH within normal range.  Continue current regimen and adjust as needed.  Plan on checking thyroid levels annually or sooner if symptomatic.   Return in 5 months for annual physical.

## 2019-09-24 NOTE — Patient Instructions (Signed)
Fat and Cholesterol Restricted Eating Plan Getting too much fat and cholesterol in your diet may cause health problems. Choosing the right foods helps keep your fat and cholesterol at normal levels. This can keep you from getting certain diseases. Your doctor may recommend an eating plan that includes:  Total fat: ______% or less of total calories a day.  Saturated fat: ______% or less of total calories a day.  Cholesterol: less than _________mg a day.  Fiber: ______g a day. What are tips for following this plan? Meal planning  At meals, divide your plate into four equal parts: ? Fill one-half of your plate with vegetables and green salads. ? Fill one-fourth of your plate with whole grains. ? Fill one-fourth of your plate with low-fat (lean) protein foods.  Eat fish that is high in omega-3 fats at least two times a week. This includes mackerel, tuna, sardines, and salmon.  Eat foods that are high in fiber, such as whole grains, beans, apples, broccoli, carrots, peas, and barley. General tips   Work with your doctor to lose weight if you need to.  Avoid: ? Foods with added sugar. ? Fried foods. ? Foods with partially hydrogenated oils.  Limit alcohol intake to no more than 1 drink a day for nonpregnant women and 2 drinks a day for men. One drink equals 12 oz of beer, 5 oz of wine, or 1 oz of hard liquor. Reading food labels  Check food labels for: ? Trans fats. ? Partially hydrogenated oils. ? Saturated fat (g) in each serving. ? Cholesterol (mg) in each serving. ? Fiber (g) in each serving.  Choose foods with healthy fats, such as: ? Monounsaturated fats. ? Polyunsaturated fats. ? Omega-3 fats.  Choose grain products that have whole grains. Look for the word "whole" as the first word in the ingredient list. Cooking  Cook foods using low-fat methods. These include baking, boiling, grilling, and broiling.  Eat more home-cooked foods. Eat at restaurants and buffets  less often.  Avoid cooking using saturated fats, such as butter, cream, palm oil, palm kernel oil, and coconut oil. Recommended foods  Fruits  All fresh, canned (in natural juice), or frozen fruits. Vegetables  Fresh or frozen vegetables (raw, steamed, roasted, or grilled). Green salads. Grains  Whole grains, such as whole wheat or whole grain breads, crackers, cereals, and pasta. Unsweetened oatmeal, bulgur, barley, quinoa, or brown rice. Corn or whole wheat flour tortillas. Meats and other protein foods  Ground beef (85% or leaner), grass-fed beef, or beef trimmed of fat. Skinless chicken or turkey. Ground chicken or turkey. Pork trimmed of fat. All fish and seafood. Egg whites. Dried beans, peas, or lentils. Unsalted nuts or seeds. Unsalted canned beans. Nut butters without added sugar or oil. Dairy  Low-fat or nonfat dairy products, such as skim or 1% milk, 2% or reduced-fat cheeses, low-fat and fat-free ricotta or cottage cheese, or plain low-fat and nonfat yogurt. Fats and oils  Tub margarine without trans fats. Light or reduced-fat mayonnaise and salad dressings. Avocado. Olive, canola, sesame, or safflower oils. The items listed above may not be a complete list of foods and beverages you can eat. Contact a dietitian for more information. Foods to avoid Fruits  Canned fruit in heavy syrup. Fruit in cream or butter sauce. Fried fruit. Vegetables  Vegetables cooked in cheese, cream, or butter sauce. Fried vegetables. Grains  White bread. White pasta. White rice. Cornbread. Bagels, pastries, and croissants. Crackers and snack foods that contain trans fat   and hydrogenated oils. Meats and other protein foods  Fatty cuts of meat. Ribs, chicken wings, bacon, sausage, bologna, salami, chitterlings, fatback, hot dogs, bratwurst, and packaged lunch meats. Liver and organ meats. Whole eggs and egg yolks. Chicken and turkey with skin. Fried meat. Dairy  Whole or 2% milk, cream,  half-and-half, and cream cheese. Whole milk cheeses. Whole-fat or sweetened yogurt. Full-fat cheeses. Nondairy creamers and whipped toppings. Processed cheese, cheese spreads, and cheese curds. Beverages  Alcohol. Sugar-sweetened drinks such as sodas, lemonade, and fruit drinks. Fats and oils  Butter, stick margarine, lard, shortening, ghee, or bacon fat. Coconut, palm kernel, and palm oils. Sweets and desserts  Corn syrup, sugars, honey, and molasses. Candy. Jam and jelly. Syrup. Sweetened cereals. Cookies, pies, cakes, donuts, muffins, and ice cream. The items listed above may not be a complete list of foods and beverages you should avoid. Contact a dietitian for more information. Summary  Choosing the right foods helps keep your fat and cholesterol at normal levels. This can keep you from getting certain diseases.  At meals, fill one-half of your plate with vegetables and green salads.  Eat high-fiber foods, like whole grains, beans, apples, carrots, peas, and barley.  Limit added sugar, saturated fats, alcohol, and fried foods. This information is not intended to replace advice given to you by your health care provider. Make sure you discuss any questions you have with your health care provider. Document Revised: 04/23/2018 Document Reviewed: 05/07/2017 Elsevier Patient Education  2020 Elsevier Inc.  

## 2019-09-24 NOTE — Assessment & Plan Note (Signed)
Chronic, last DEXA June 2019.  Continue Vitamin D, may need to utilize higher weekly doses in future as level 29.1 on recent labs.  Recheck this at next visit and consider weekly dosing.  Due for DEXA in June.   Return in 5 months for annual physical.

## 2019-09-24 NOTE — Progress Notes (Signed)
Temp 97.6 F (36.4 C) (Oral)   Wt 166 lb (75.3 kg)   BMI 26.05 kg/m    Subjective:    Patient ID: Cindy Jefferson, female    DOB: 26-Jul-1956, 64 y.o.   MRN: [id number]  HPI: Cindy Jefferson is a 64 y.o. female  Chief Complaint  Patient presents with  . Hypothyroidism    . This visit was completed via FaceTime due to the restrictions of the COVID-19 pandemic. All issues as above were discussed and addressed. Physical exam was done as above through visual confirmation on FaceTime. If it was felt that the patient should be evaluated in the office, they were directed there. The patient verbally consented to this visit. . Location of the patient: home . Location of the provider: home . Those involved with this call:  . Provider: Marnee Guarneri, DNP . CMA: Yvonna Alanis, CMA . Front Desk/Registration: Don Perking  . Time spent on call: 15 minutes on video discussing health concerns. 10 minutes total spent in review of patient's record and preparation of their chart. . I verified patient identity using two factors (patient name and date of birth). Patient consents verbally to being seen via telemedicine visit today.    HYPOTHYROIDISM Recent TSH 2.790 and continues on Levothyroxine 75 MCG. Thyroid control status:stable Satisfied with current treatment? yes Medication side effects: no Medication compliance: good compliance Etiology of hypothyroidism:  Recent dose adjustment:no Fatigue: no Cold intolerance: no Heat intolerance: no Weight gain: no Weight loss: no Constipation: no Diarrhea/loose stools: no Palpitations: no Lower extremity edema: no Anxiety/depressed mood: no   OSTEOPOROSIS Last DEXA in 2019 with hip T score -2.5 and femur -2.3.  Will repeat DEXA in June 2021 Satisfied with current treatment?: yes Medication side effects: no Medication compliance: good compliance Past osteoporosis medications/treatments: calcium and Vit D Adequate calcium &  vitamin D: yes Weight bearing exercises: yes   HYPERLIPIDEMIA No current medications.  Diet focused. Hyperlipidemia status: good compliance Supplements: none Aspirin:  no The 10-year ASCVD risk score Mikey Bussing DC Jr., et al., 2013) is: 3.3%   Values used to calculate the score:     Age: 44 years     Sex: Female     Is Non-Hispanic African American: No     Diabetic: No     Tobacco smoker: No     Systolic Blood Pressure: 99991111 mmHg     Is BP treated: No     HDL Cholesterol: 82 mg/dL     Total Cholesterol: 233 mg/dL Chest pain:  no Coronary artery disease:  no Family history CAD:  yes Family history early CAD:  no   MIGRAINES Has had migraines for 30 years or more.  Currently followed by neurology, Dr. Manuella Ghazi, with initial visit on 05/19/2019, was started on Rimegepant which she reports has decreased amount of migraines.  Takes occasional Relpax.  Previously had been on Depakote for maintenance, but got pancreatitis with this and stopped. Has never had an aura.  At baseline her migraines present to side of head around, often right side.  Has underlying cervical disc disease. Duration: chronic Onset: gradual Location: often right side of head, but sometimes left Headache duration: 72 hours at baseline Radiation: yes around right eye and side of head when present Time of day headache occurs: varies Alleviating factors: Relpax and occasionally Ibuprofen Aggravating factors: unknown Headache status at time of visit: none Treatments attempted: Rimegepant, Relpax, Ibuprofen, and Depakote in past which caused pancreatitis Nausea:  no Vomiting:  no Photophobia:  no Phonophobia:  no Effect on social functioning:  yes RED FLAGS: Confusion:  no Gait disturbance/ataxia:  no Behavioral changes:  no Fevers:  no  Relevant past medical, surgical, family and social history reviewed and updated as indicated. Interim medical history since our last visit reviewed. Allergies and medications reviewed  and updated.  Review of Systems  Constitutional: Negative for activity change, appetite change, diaphoresis, fatigue and fever.  Respiratory: Negative for cough, chest tightness and shortness of breath.   Cardiovascular: Negative for chest pain, palpitations and leg swelling.  Gastrointestinal: Negative.   Endocrine: Negative for cold intolerance and heat intolerance.  Neurological: Negative.   Psychiatric/Behavioral: Negative.     Per HPI unless specifically indicated above     Objective:    Temp 97.6 F (36.4 C) (Oral)   Wt 166 lb (75.3 kg)   BMI 26.05 kg/m   Wt Readings from Last 3 Encounters:  09/24/19 166 lb (75.3 kg)  07/22/19 165 lb (74.8 kg)  04/02/19 162 lb (73.5 kg)    Physical Exam Vitals and nursing note reviewed.  Constitutional:      General: She is awake. She is not in acute distress.    Appearance: She is well-developed. She is not ill-appearing.  HENT:     Head: Normocephalic.     Right Ear: Hearing normal.     Left Ear: Hearing normal.  Eyes:     General: Lids are normal.        Right eye: No discharge.        Left eye: No discharge.     Conjunctiva/sclera: Conjunctivae normal.  Pulmonary:     Effort: Pulmonary effort is normal. No accessory muscle usage or respiratory distress.  Musculoskeletal:     Cervical back: Normal range of motion.  Neurological:     Mental Status: She is alert and oriented to person, place, and time.  Psychiatric:        Attention and Perception: Attention normal.        Mood and Affect: Mood normal.        Behavior: Behavior normal. Behavior is cooperative.        Thought Content: Thought content normal.        Judgment: Judgment normal.     Results for orders placed or performed in visit on 09/18/19  Thyroid Panel With TSH  Result Value Ref Range   TSH 2.790 0.450 - 4.500 uIU/mL   T4, Total 9.0 4.5 - 12.0 ug/dL   T3 Uptake Ratio 24 24 - 39 %   Free Thyroxine Index 2.2 1.2 - 4.9  Vit D  25 hydroxy (rtn  osteoporosis monitoring)  Result Value Ref Range   Vit D, 25-Hydroxy 29.0 (L) 30.0 - 100.0 ng/mL  Lipid Panel w/o Chol/HDL Ratio out  Result Value Ref Range   Cholesterol, Total 233 (H) 100 - 199 mg/dL   Triglycerides 115 0 - 149 mg/dL   HDL 82 >39 mg/dL   VLDL Cholesterol Cal 20 5 - 40 mg/dL   LDL Chol Calc (NIH) 131 (H) 0 - 99 mg/dL  Comprehensive metabolic panel  Result Value Ref Range   Glucose 81 65 - 99 mg/dL   BUN 16 8 - 27 mg/dL   Creatinine, Ser 0.90 0.57 - 1.00 mg/dL   GFR calc non Af Amer 68 >59 mL/min/1.73   GFR calc Af Amer 79 >59 mL/min/1.73   BUN/Creatinine Ratio 18 12 - 28   Sodium 140 134 - 144  mmol/L   Potassium 4.5 3.5 - 5.2 mmol/L   Chloride 102 96 - 106 mmol/L   CO2 26 20 - 29 mmol/L   Calcium 9.4 8.7 - 10.3 mg/dL   Total Protein 7.2 6.0 - 8.5 g/dL   Albumin 4.4 3.8 - 4.8 g/dL   Globulin, Total 2.8 1.5 - 4.5 g/dL   Albumin/Globulin Ratio 1.6 1.2 - 2.2   Bilirubin Total 0.6 0.0 - 1.2 mg/dL   Alkaline Phosphatase 96 39 - 117 IU/L   AST 24 0 - 40 IU/L   ALT 23 0 - 32 IU/L      Assessment & Plan:   Problem List Items Addressed This Visit      Cardiovascular and Mediastinum   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  Continue current medication regimen and collaboration with neurology.  She has history of pancreatitis with medications used for migraines, will need to be cautious with treatment.  Return in 5 months for annual physical.        Endocrine   Acquired hypothyroidism - Primary    Chronic, stable with TSH within normal range.  Continue current regimen and adjust as needed.  Plan on checking thyroid levels annually or sooner if symptomatic.   Return in 5 months for annual physical.        Musculoskeletal and Integument   Osteoporosis    Chronic, last DEXA June 2019.  Continue Vitamin D, may need to utilize higher weekly doses in future as level 29.1  on recent labs.  Recheck this at next visit and consider weekly dosing.  Due for DEXA in June.   Return in 5 months for annual physical.        Other   Elevated LDL cholesterol level    Ongoing, with ASCVD 3.3%.  Continue focus on diet regimen, discussed at length with her.  She may trial red yeast rice.  Poor tolerance to fish oil due to GERD.   Return in 5 months for annual physical.         I discussed the assessment and treatment plan with the patient. The patient was provided an opportunity to ask questions and all were answered. The patient agreed with the plan and demonstrated an understanding of the instructions.   The patient was advised to call back or seek an in-person evaluation if the symptoms worsen or if the condition fails to improve as anticipated.   I provided 15 minutes of time during this encounter.  Follow up plan: Return in about 5 months (around 02/22/2020) for Annual physical.

## 2019-09-30 DIAGNOSIS — M216X1 Other acquired deformities of right foot: Secondary | ICD-10-CM | POA: Diagnosis not present

## 2019-09-30 DIAGNOSIS — M79671 Pain in right foot: Secondary | ICD-10-CM | POA: Diagnosis not present

## 2019-09-30 DIAGNOSIS — S93311A Subluxation of tarsal joint of right foot, initial encounter: Secondary | ICD-10-CM | POA: Diagnosis not present

## 2019-09-30 DIAGNOSIS — M79672 Pain in left foot: Secondary | ICD-10-CM | POA: Diagnosis not present

## 2019-10-06 ENCOUNTER — Emergency Department
Admission: EM | Admit: 2019-10-06 | Discharge: 2019-10-06 | Disposition: A | Discharge status: Home / Self Care | Payer: BC Managed Care – PPO | Attending: Emergency Medicine | Admitting: Emergency Medicine

## 2019-10-06 ENCOUNTER — Encounter: Payer: Self-pay | Admitting: Emergency Medicine

## 2019-10-06 ENCOUNTER — Other Ambulatory Visit: Payer: Self-pay

## 2019-10-06 ENCOUNTER — Emergency Department: Payer: BC Managed Care – PPO

## 2019-10-06 DIAGNOSIS — R519 Headache, unspecified: Secondary | ICD-10-CM

## 2019-10-06 DIAGNOSIS — G43909 Migraine, unspecified, not intractable, without status migrainosus: Secondary | ICD-10-CM | POA: Diagnosis not present

## 2019-10-06 DIAGNOSIS — E038 Other specified hypothyroidism: Secondary | ICD-10-CM | POA: Insufficient documentation

## 2019-10-06 LAB — CBC WITH DIFFERENTIAL/PLATELET
Abs Immature Granulocytes: 0.01 10*3/uL (ref 0.00–0.07)
Basophils Absolute: 0.1 10*3/uL (ref 0.0–0.1)
Basophils Relative: 1 %
Eosinophils Absolute: 0 10*3/uL (ref 0.0–0.5)
Eosinophils Relative: 0 %
HCT: 41.9 % (ref 36.0–46.0)
Hemoglobin: 13.7 g/dL (ref 12.0–15.0)
Immature Granulocytes: 0 %
Lymphocytes Relative: 33 %
Lymphs Abs: 2.7 10*3/uL (ref 0.7–4.0)
MCH: 28.5 pg (ref 26.0–34.0)
MCHC: 32.7 g/dL (ref 30.0–36.0)
MCV: 87.3 fL (ref 80.0–100.0)
Monocytes Absolute: 0.6 10*3/uL (ref 0.1–1.0)
Monocytes Relative: 8 %
Neutro Abs: 4.8 10*3/uL (ref 1.7–7.7)
Neutrophils Relative %: 58 %
Platelets: 265 10*3/uL (ref 150–400)
RBC: 4.8 MIL/uL (ref 3.87–5.11)
RDW: 12.2 % (ref 11.5–15.5)
WBC: 8.2 10*3/uL (ref 4.0–10.5)
nRBC: 0 % (ref 0.0–0.2)

## 2019-10-06 LAB — BASIC METABOLIC PANEL
Anion gap: 10 (ref 5–15)
BUN: 21 mg/dL (ref 8–23)
CO2: 27 mmol/L (ref 22–32)
Calcium: 9.4 mg/dL (ref 8.9–10.3)
Chloride: 101 mmol/L (ref 98–111)
Creatinine, Ser: 0.72 mg/dL (ref 0.44–1.00)
GFR calc Af Amer: >60 mL/min (ref >60)
GFR calc non Af Amer: >60 mL/min (ref >60)
Glucose, Bld: 95 mg/dL (ref 70–99)
Potassium: 3.6 mmol/L (ref 3.5–5.1)
Sodium: 138 mmol/L (ref 135–145)

## 2019-10-06 MED ORDER — METOCLOPRAMIDE HCL 5 MG/ML IJ SOLN
10.0000 mg | Freq: Once | INTRAMUSCULAR | Status: AC
  Administered 2019-10-06: 12:00:00 10 mg via INTRAVENOUS
  Filled 2019-10-06: qty 2

## 2019-10-06 MED ORDER — HYDROMORPHONE HCL 1 MG/ML IJ SOLN
1.0000 mg | Freq: Once | INTRAMUSCULAR | Status: AC
  Administered 2019-10-06: 1 mg via INTRAVENOUS
  Filled 2019-10-06: qty 1

## 2019-10-06 MED ORDER — GADOBUTROL 1 MMOL/ML IV SOLN
7.0000 mL | Freq: Once | INTRAVENOUS | Status: AC | PRN
  Administered 2019-10-06: 13:00:00 7 mL via INTRAVENOUS

## 2019-10-06 MED ORDER — KETOROLAC TROMETHAMINE 30 MG/ML IJ SOLN
30.0000 mg | Freq: Once | INTRAMUSCULAR | Status: AC
  Administered 2019-10-06: 30 mg via INTRAVENOUS
  Filled 2019-10-06: qty 1

## 2019-10-06 MED ORDER — LORAZEPAM 2 MG/ML IJ SOLN
0.5000 mg | Freq: Once | INTRAMUSCULAR | Status: AC
  Administered 2019-10-06: 0.5 mg via INTRAVENOUS
  Filled 2019-10-06: qty 1

## 2019-10-06 MED ORDER — HYDROMORPHONE HCL 1 MG/ML IJ SOLN
0.5000 mg | Freq: Once | INTRAMUSCULAR | Status: AC
  Administered 2019-10-06: 13:00:00 0.5 mg via INTRAVENOUS
  Filled 2019-10-06: qty 1

## 2019-10-06 MED ORDER — SODIUM CHLORIDE 0.9 % IV SOLN: Freq: Once | INTRAVENOUS | Status: AC

## 2019-10-06 NOTE — ED Triage Notes (Signed)
Pt in via POV, complaints of sudden onset posterior headache, states, "I have migraines but this is different."  Reports some associated nausea, sensitivity to sound.  Pt tearful in triage; reports worst HA of life.

## 2019-10-06 NOTE — ED Notes (Signed)
Full rainbow sent to lab.  

## 2019-10-06 NOTE — ED Notes (Signed)
Pt presentation discussed with EPD, Williams; see new orders.

## 2019-10-06 NOTE — ED Provider Notes (Signed)
[facility] Emergency Department Provider Note       Time seen: ----------------------------------------- 11:43 AM on 10/06/2019 -----------------------------------------   I have reviewed the triage vital signs and the nursing notes.  HISTORY   Chief Complaint Headache    HPI Cindy Jefferson is a 64 y.o. female with a history of GERD, migraines, pancreatitis, thyroid disease who presents to the ED for sudden onset posterior headache.  Patient states she has chronic headaches but this feels differently.  She reports some nausea, sensitivity to sound and light.  Reports this is the worst headache she has ever had.  Past Medical History:  Diagnosis Date  . GERD (gastroesophageal reflux disease)   . Migraine   . Migraines   . Pancreatitis   . Thyroid disease     Patient Active Problem List   Diagnosis Date Noted  . Elevated LDL cholesterol level 09/17/2019  . Intractable migraine with aura without status migrainosus 06/03/2019  . Vision changes 04/02/2019  . Neck pain 10/22/2018  . Overweight (BMI 25.0-29.9) 09/15/2018  . At high risk for caregiver role strain 09/15/2018  . Acquired hypothyroidism 01/10/2018  . GERD without esophagitis 01/10/2018  . Osteoporosis 01/10/2018    Past Surgical History:  Procedure Laterality Date  . ABDOMINAL HYSTERECTOMY     total, does not get pap smears   . CHOLECYSTECTOMY    . FOOT SURGERY    . neck fusion      Allergies Valproic acid, Erythromycin, and Tape  Social History Social History   Tobacco Use  . Smoking status: Never Smoker  . Smokeless tobacco: Never Used  Substance Use Topics  . Alcohol use: Not Currently  . Drug use: Never    Review of Systems Constitutional: Negative for fever. Cardiovascular: Negative for chest pain. Respiratory: Negative for shortness of breath. Gastrointestinal: Negative for abdominal pain, positive for nausea Musculoskeletal: Negative for back pain. Skin:  Negative for rash. Neurological: Positive for headache  All systems negative/normal/unremarkable except as stated in the HPI  ____________________________________________   PHYSICAL EXAM:  VITAL SIGNS: ED Triage Vitals  Enc Vitals Group     BP 10/06/19 1020 (!) 163/93     Pulse Rate 10/06/19 1020 69     Resp 10/06/19 1020 18     Temp 10/06/19 1020 (!) 97.4 F (36.3 C)     Temp Source 10/06/19 1020 Oral     SpO2 10/06/19 1020 100 %     Weight 10/06/19 1021 164 lb (74.4 kg)     Height 10/06/19 1021 5\' 7"  (1.702 m)     Head Circumference --      Peak Flow --      Pain Score 10/06/19 1020 10     Pain Loc --      Pain Edu? --      Excl. in [place]? --     Constitutional: Alert and oriented.  Mild distress from pain Eyes: Conjunctivae are normal. Normal extraocular movements. ENT      Head: Normocephalic and atraumatic.      Nose: No congestion/rhinnorhea.      Mouth/Throat: Mucous membranes are moist.      Neck: No stridor. Cardiovascular: Normal rate, regular rhythm. No murmurs, rubs, or gallops. Respiratory: Normal respiratory effort without tachypnea nor retractions. Breath sounds are clear and equal bilaterally. No wheezes/rales/rhonchi. Gastrointestinal: Soft and nontender. Normal bowel sounds Musculoskeletal: Nontender with normal range of motion in extremities. No lower extremity tenderness nor edema. Neurologic:  Normal speech and language.  No gross focal neurologic deficits are appreciated.  Skin:  Skin is warm, dry and intact. No rash noted. Psychiatric: Mood and affect are normal. Speech and behavior are normal.  ____________________________________________  EKG: Interpreted by me.  Sinus rhythm with a rate of 63 bpm, normal PR interval, normal QRS, normal QT  ____________________________________________  ED COURSE:  As part of my medical decision making, I reviewed the following data within the [place] History obtained from family if  available, nursing notes, old chart and ekg, as well as notes from prior ED visits. Patient presented for severe headache, we will assess with labs and imaging as indicated at this time.   Procedures  Cindy Jefferson was evaluated in Emergency Department on 10/06/2019 for the symptoms described in the history of present illness. She was evaluated in the context of the global COVID-19 pandemic, which necessitated consideration that the patient might be at risk for infection with the SARS-CoV-2 virus that causes COVID-19. Institutional protocols and algorithms that pertain to the evaluation of patients at risk for COVID-19 are in a state of rapid change based on information released by regulatory bodies including the CDC and federal and state organizations. These policies and algorithms were followed during the patient's care in the ED.  ____________________________________________   LABS (pertinent positives/negatives)  Labs Reviewed  CBC WITH DIFFERENTIAL/PLATELET  BASIC METABOLIC PANEL    RADIOLOGY Images were viewed by me  CT head/MRI/MRA/MRV  IMPRESSION: The right temporal lobe appears relatively low density with loss of the gray-white matter differentiation. However, there is prominent beam hardening artifact related to the osseous structures of the skull base within this area, which is favored to artifactually explain these findings. MRI of the brain can be performed to exclude the presence of an acute infarction within this region.  IMPRESSION:  No evidence of recent infarction, hemorrhage, or mass. Minor chronic  microvascular ischemic changes.   No hemodynamically significant stenosis or aneurysm.   No evidence of venous thrombosis.  ____________________________________________   DIFFERENTIAL DIAGNOSIS   Migraine, tension headache, anxiety, subarachnoid hemorrhage, infarction  FINAL ASSESSMENT AND PLAN  Headache   Plan: The patient had presented for severe  posterior headache. Patient's labs were reassuring. Patient's imaging was indeterminate with a right temporal lobe abnormality.  MRI and MRA were ordered which were unremarkable.  She was given IV medication for headache and this seemed to resolve her symptoms.  At the time my evaluation her symptoms have resolved, she is agreeable to going home and having outpatient follow-up.   Laurence Aly, MD    Note: This note was generated in part or whole with voice recognition software. Voice recognition is usually quite accurate but there are transcription errors that can and very often do occur. I apologize for any typographical errors that were not detected and corrected.     Earleen Newport, MD 10/06/19 [phone]

## 2019-10-07 ENCOUNTER — Telehealth: Payer: Self-pay

## 2019-10-07 NOTE — Telephone Encounter (Signed)
Copied from [place] [phone]. Topic: General - Other >> Oct 07, 2019 10:55 AM Cindy Jefferson wrote: Reason for CRM: pt called in to make provider aware that she had a ED visit due to migraine yesterday. Pt says that she woke up this morning with another migraine, pt says that she would like to be covid tested, assisted pt with scheduling. Pt would like to know how do  PCP advise?   CB:

## 2019-10-07 NOTE — Telephone Encounter (Signed)
Called patient, no answer, left a message asking patient to return my call.

## 2019-10-07 NOTE — Telephone Encounter (Signed)
Definitely recommend Covid testing and would recommend follow-up ASAP with either myself or neurology.  Reviewed her recent ER note, appears MRI normal.  If worsening symptoms today, such as loss of function, slurred speech, vision changes then immediately return to ER.

## 2019-10-08 ENCOUNTER — Ambulatory Visit: Payer: BC Managed Care – PPO | Attending: Internal Medicine

## 2019-10-08 DIAGNOSIS — Z20822 Contact with and (suspected) exposure to covid-19: Secondary | ICD-10-CM | POA: Diagnosis not present

## 2019-10-08 NOTE — Telephone Encounter (Signed)
Pt called. LVM asking pt to return call.

## 2019-10-08 NOTE — Telephone Encounter (Signed)
Called and left patient a VM asking for her to please return my call.  

## 2019-10-09 LAB — NOVEL CORONAVIRUS, NAA: SARS-CoV-2, NAA: NOT DETECTED

## 2019-10-09 NOTE — Telephone Encounter (Signed)
Called and spoke to patient. She states she tested negative for COVID yesterday and has an upcoming appointment with Neuro in the next few weeks.

## 2019-10-14 DIAGNOSIS — S93311A Subluxation of tarsal joint of right foot, initial encounter: Secondary | ICD-10-CM | POA: Diagnosis not present

## 2019-10-14 DIAGNOSIS — M7661 Achilles tendinitis, right leg: Secondary | ICD-10-CM | POA: Diagnosis not present

## 2019-10-19 ENCOUNTER — Ambulatory Visit: Payer: BLUE CROSS/BLUE SHIELD | Admitting: Urology

## 2019-10-20 DIAGNOSIS — G43119 Migraine with aura, intractable, without status migrainosus: Secondary | ICD-10-CM | POA: Diagnosis not present

## 2019-10-20 DIAGNOSIS — R292 Abnormal reflex: Secondary | ICD-10-CM | POA: Diagnosis not present

## 2019-10-20 DIAGNOSIS — M5481 Occipital neuralgia: Secondary | ICD-10-CM | POA: Diagnosis not present

## 2019-10-20 DIAGNOSIS — M5090 Cervical disc disorder, unspecified, unspecified cervical region: Secondary | ICD-10-CM | POA: Diagnosis not present

## 2019-10-28 DIAGNOSIS — Z03818 Encounter for observation for suspected exposure to other biological agents ruled out: Secondary | ICD-10-CM | POA: Diagnosis not present

## 2019-10-28 DIAGNOSIS — U071 COVID-19: Secondary | ICD-10-CM | POA: Diagnosis not present

## 2019-10-29 ENCOUNTER — Telehealth: Payer: Self-pay | Admitting: Nurse Practitioner

## 2019-10-29 NOTE — Telephone Encounter (Signed)
Called pt she states that she has spoke to [place] already. Her main concern is that she has a fever of 99 and wants to know if she should take tylenol or just let it break on its own. Please advise.   Copied from [place] [phone]. Topic: General - Inquiry >> Oct 29, 2019 11:30 AM Richardo Priest, NT wrote: Reason for CRM: Patient called in stating she tested positive for COVID-19, 2/24. Patient has cough, sore throat, chills, slight fever. Patient would like advice. Please advise.

## 2019-10-29 NOTE — Telephone Encounter (Signed)
Pt.notified

## 2019-10-29 NOTE — Telephone Encounter (Signed)
Please alert patient she can take Tylenol for fever and comfort.

## 2019-11-05 ENCOUNTER — Ambulatory Visit: Payer: Self-pay | Admitting: *Deleted

## 2019-11-05 ENCOUNTER — Other Ambulatory Visit: Payer: Self-pay

## 2019-11-05 ENCOUNTER — Encounter: Payer: Self-pay | Admitting: Family Medicine

## 2019-11-05 ENCOUNTER — Telehealth (INDEPENDENT_AMBULATORY_CARE_PROVIDER_SITE_OTHER): Payer: BC Managed Care – PPO | Admitting: Family Medicine

## 2019-11-05 VITALS — BP 133/86 | HR 84 | Temp 100.1°F | Ht 67.0 in | Wt 165.0 lb

## 2019-11-05 DIAGNOSIS — U071 COVID-19: Secondary | ICD-10-CM | POA: Diagnosis not present

## 2019-11-05 MED ORDER — AZITHROMYCIN 250 MG PO TABS: ORAL_TABLET | ORAL | 0 refills | Status: DC

## 2019-11-05 NOTE — Progress Notes (Signed)
BP 133/86   Pulse 84   Temp 100.1 F (37.8 C) (Tympanic)   Ht 5\' 7"  (1.702 m)   Wt 165 lb (74.8 kg)   SpO2 98%   BMI 25.84 kg/m    Subjective:    Patient ID: Cindy Jefferson, female    DOB: [birth year], 64 y.o.   MRN: [id number]  HPI: Cindy Jefferson is a 64 y.o. female  Chief Complaint  Patient presents with  . Fatigue    symptoms started 10/27/19. positive covid test, per patient  . Fever  . Generalized Body Aches  . Muscle Pain  . Cough    . This visit was completed via MyChart due to the restrictions of the COVID-19 pandemic. All issues as above were discussed and addressed. Physical exam was done as above through visual confirmation on MyChart. If it was felt that the patient should be evaluated in the office, they were directed there. The patient verbally consented to this visit. . Location of the patient: home . Location of the provider: work . Those involved with this call:  . Provider: Merrie Roof, PA-C . CMA: Lesle Chris, [place] . Front Desk/Registration: Jill Side  . Time spent on call: 15 minutes with patient face to face via video conference. More than 50% of this time was spent in counseling and coordination of care. 5 minutes total spent in review of patient's record and preparation of their chart. I verified patient identity using two factors (patient name and date of birth). Patient consents verbally to being seen via telemedicine visit today.   Tested positive for COVID about 9 days ago, still having intermittent fevers around 100-102, cough, fatigue, chills, sweats, congestion, DOE. Taking tylenol as needed for sxs. Tolerating PO well.   Relevant past medical, surgical, family and social history reviewed and updated as indicated. Interim medical history since our last visit reviewed. Allergies and medications reviewed and updated.  Review of Systems  Per HPI unless specifically indicated above     Objective:    BP 133/86   Pulse 84   Temp  100.1 F (37.8 C) (Tympanic)   Ht 5\' 7"  (1.702 m)   Wt 165 lb (74.8 kg)   SpO2 98%   BMI 25.84 kg/m   Wt Readings from Last 3 Encounters:  11/05/19 165 lb (74.8 kg)  10/06/19 164 lb (74.4 kg)  09/24/19 166 lb (75.3 kg)    Physical Exam Vitals and nursing note reviewed.  Constitutional:      General: She is not in acute distress.    Appearance: Normal appearance.  HENT:     Head: Atraumatic.     Right Ear: External ear normal.     Left Ear: External ear normal.     Nose: Congestion present.     Mouth/Throat:     Mouth: Mucous membranes are moist.     Pharynx: Oropharynx is clear. Posterior oropharyngeal erythema present.  Eyes:     Extraocular Movements: Extraocular movements intact.     Conjunctiva/sclera: Conjunctivae normal.  Cardiovascular:     Comments: Unable to assess via virtual visit Pulmonary:     Effort: Pulmonary effort is normal. No respiratory distress.     Comments: Speaking in full sentences without labored breathing Musculoskeletal:        General: Normal range of motion.     Cervical back: Normal range of motion.  Skin:    General: Skin is dry.     Findings: No erythema.  Neurological:  Mental Status: She is alert and oriented to person, place, and time.  Psychiatric:        Mood and Affect: Mood normal.        Thought Content: Thought content normal.        Judgment: Judgment normal.     Results for orders placed or performed in visit on 10/08/19  Novel Coronavirus, NAA ([facility])   Specimen: Nasopharyngeal(NP) swabs in vial transport medium   NASOPHARYNGE  TESTING  Result Value Ref Range   SARS-CoV-2, NAA Not Detected Not Detected      Assessment & Plan:   Problem List Items Addressed This Visit    None    Visit Diagnoses    COVID-19 virus infection    -  Primary   Persistent sxs, tx with zpak, mucinex, cough suppressants. F/u if not improving or worsening. Continue isolation until sxs resolved       Follow up plan: Return if  symptoms worsen or fail to improve.

## 2019-11-05 NOTE — Telephone Encounter (Signed)
O2 sat- 98 P 97 Patient has COVID and has been sick for 10 days now. Patient reports she has had fever, fatigue, back pain, SOB with exertion- all symptoms not improving.  Call to office for appointment.  Reason for Disposition . [1] Fever > 101 F (38.3 C) AND [2] age > 41  Answer Assessment - Initial Assessment Questions 1. COVID-19 DIAGNOSIS: "Who made your Coronavirus (COVID-19) diagnosis?" "Was it confirmed by a positive lab test?" If not diagnosed by a HCP, ask "Are there lots of cases (community spread) where you live?" (See public health department website, if unsure)     [facility]- last Wednesday-2/24 2. COVID-19 EXPOSURE: "Was there any known exposure to COVID before the symptoms began?" CDC Definition of close contact: within 6 feet (2 meters) for a total of 15 minutes or more over a 24-hour period.      Exposure from visit to family 3. ONSET: "When did the COVID-19 symptoms start?"      Tuesday- sore throat 4. WORST SYMPTOM: "What is your worst symptom?" (e.g., cough, fever, shortness of breath, muscle aches)     Fever, fatigue 5. COUGH: "Do you have a cough?" If so, ask: "How bad is the cough?"       Yes- drainage only 6. FEVER: "Do you have a fever?" If so, ask: "What is your temperature, how was it measured, and when did it start?"     102- last night after medication wore off, using Tylenol 7. RESPIRATORY STATUS: "Describe your breathing?" (e.g., shortness of breath, wheezing, unable to speak)      SOB when active- back pain, but no chest pain 8. BETTER-SAME-WORSE: "Are you getting better, staying the same or getting worse compared to yesterday?"  If getting worse, ask, "In what way?"     Same- loss of appetite  9. HIGH RISK DISEASE: "Do you have any chronic medical problems?" (e.g., asthma, heart or lung disease, weak immune system, obesity, etc.)     no 10. PREGNANCY: "Is there any chance you are pregnant?" "When was your last menstrual period?"       n/a 11. OTHER  SYMPTOMS: "Do you have any other symptoms?"  (e.g., chills, fatigue, headache, loss of smell or taste, muscle pain, sore throat; new loss of smell or taste especially support the diagnosis of COVID-19)       Chills, muscle pain, loss of appetite  Protocols used: CORONAVIRUS (COVID-19) DIAGNOSED OR SUSPECTED-A-AH

## 2019-11-08 DIAGNOSIS — R05 Cough: Secondary | ICD-10-CM | POA: Diagnosis not present

## 2019-11-08 DIAGNOSIS — R509 Fever, unspecified: Secondary | ICD-10-CM | POA: Diagnosis not present

## 2019-11-08 DIAGNOSIS — R0789 Other chest pain: Secondary | ICD-10-CM | POA: Diagnosis not present

## 2019-11-08 DIAGNOSIS — Z8616 Personal history of COVID-19: Secondary | ICD-10-CM | POA: Diagnosis not present

## 2019-11-09 ENCOUNTER — Telehealth: Payer: Self-pay

## 2019-11-09 NOTE — Telephone Encounter (Signed)
Called patient to schedule a positive covid 19 tes follow up, per Marnee Guarneri, DNP. Patient stated that she went to the [facility] clinic yesterday and was given prednisone and had a chest X-rays done. Patient stated that the X-rays was normal and she will wait to see if she gets better with the prednisone. She stated that if still not getting better in a few days,  she will call to make an appointment. FYI

## 2019-11-09 NOTE — Telephone Encounter (Signed)
Noted, thank you

## 2019-11-11 ENCOUNTER — Encounter: Payer: Self-pay | Admitting: Nurse Practitioner

## 2019-11-11 ENCOUNTER — Telehealth (INDEPENDENT_AMBULATORY_CARE_PROVIDER_SITE_OTHER): Payer: BC Managed Care – PPO | Admitting: Nurse Practitioner

## 2019-11-11 DIAGNOSIS — U071 COVID-19: Secondary | ICD-10-CM | POA: Diagnosis not present

## 2019-11-11 MED ORDER — ALBUTEROL SULFATE HFA 108 (90 BASE) MCG/ACT IN AERS: 2.0000 | INHALATION_SPRAY | Freq: Four times a day (QID) | RESPIRATORY_TRACT | 2 refills | Status: DC | PRN

## 2019-11-11 MED ORDER — BENZONATATE 200 MG PO CAPS: 200.0000 mg | ORAL_CAPSULE | Freq: Three times a day (TID) | ORAL | 1 refills | Status: DC | PRN

## 2019-11-11 NOTE — Progress Notes (Signed)
BP 128/73   Pulse 63   Temp (!) 96.8 F (36 C) (Oral)   SpO2 99%    Subjective:    Patient ID: Cindy Jefferson, female    DOB: December 31, 1955, 64 y.o.   MRN: [id number]  HPI: Cindy Jefferson is a 64 y.o. female  Chief Complaint  Patient presents with  . Follow-up    f/up from COVID diagnosis    . This visit was completed via MyChart due to the restrictions of the COVID-19 pandemic. All issues as above were discussed and addressed. Physical exam was done as above through visual confirmation on MyChart. If it was felt that the patient should be evaluated in the office, they were directed there. The patient verbally consented to this visit. . Location of the patient: home . Location of the provider: work . Those involved with this call:  . Provider: Marnee Guarneri, DNP . CMA: Yvonna Alanis, CMA . Front Desk/Registration: Don Perking  . Time spent on call: 15 minutes with patient face to face via video conference. More than 50% of this time was spent in counseling and coordination of care. 10 minutes total spent in review of patient's record and preparation of their chart.  . I verified patient identity using two factors (patient name and date of birth). Patient consents verbally to being seen via telemedicine visit today.    COVID FOLLOW-UP She is feeling a lot better than she was.  Had a fever for 12 days and has now stopped.  No fever since 1630 on Monday.  Went to urgent care on 11/08/2019 -- normal CXR obtained.  Was given Prednisone and to complete Azithromycin.  Is taking Tessalon as needed.  Is having off and on back pain with cough.  She reports health department has been staying in touch and they told her she could come off quarantine at this time. Fever: no Cough: yes Shortness of breath: no Wheezing: no Chest pain: no Chest tightness: yes Chest congestion: no Nasal congestion: yes Runny nose: yes Post nasal drip: yes Sneezing: no Sore throat: no Swollen  glands: no Sinus pressure: no Headache: no Face pain: no Toothache: no Ear pain: none Ear pressure: none Eyes red/itching:no Eye drainage/crusting: no  Vomiting: no Rash: no Fatigue: yes Sick contacts: yes Strep contacts: no  Context: stable Recurrent sinusitis: no Relief with OTC cold/cough medications: yes  Treatments attempted: cold/sinus, cough syrup and antibiotics   Relevant past medical, surgical, family and social history reviewed and updated as indicated. Interim medical history since our last visit reviewed. Allergies and medications reviewed and updated.  Review of Systems  Constitutional: Negative for activity change, appetite change, diaphoresis, fatigue and fever.  HENT: Positive for congestion, postnasal drip and rhinorrhea. Negative for ear discharge, ear pain, sinus pressure, sinus pain, sneezing and sore throat.   Respiratory: Positive for cough. Negative for chest tightness, shortness of breath and wheezing.   Cardiovascular: Negative for chest pain, palpitations and leg swelling.  Gastrointestinal: Negative.   Neurological: Negative.   Psychiatric/Behavioral: Negative.     Per HPI unless specifically indicated above     Objective:    BP 128/73   Pulse 63   Temp (!) 96.8 F (36 C) (Oral)   SpO2 99%   Wt Readings from Last 3 Encounters:  11/05/19 165 lb (74.8 kg)  10/06/19 164 lb (74.4 kg)  09/24/19 166 lb (75.3 kg)    Physical Exam Vitals and nursing note reviewed.  Constitutional:  General: She is awake. She is not in acute distress.    Appearance: She is well-developed. She is not ill-appearing.  HENT:     Head: Normocephalic.     Right Ear: Hearing normal.     Left Ear: Hearing normal.  Eyes:     General: Lids are normal.        Right eye: No discharge.        Left eye: No discharge.     Conjunctiva/sclera: Conjunctivae normal.  Pulmonary:     Effort: Pulmonary effort is normal. No accessory muscle usage or respiratory distress.   Musculoskeletal:     Cervical back: Normal range of motion.  Neurological:     Mental Status: She is alert and oriented to person, place, and time.  Psychiatric:        Attention and Perception: Attention normal.        Mood and Affect: Mood normal.        Behavior: Behavior normal. Behavior is cooperative.        Thought Content: Thought content normal.        Judgment: Judgment normal.     Results for orders placed or performed in visit on 10/08/19  Novel Coronavirus, NAA ([facility])   Specimen: Nasopharyngeal(NP) swabs in vial transport medium   NASOPHARYNGE  TESTING  Result Value Ref Range   SARS-CoV-2, NAA Not Detected Not Detected      Assessment & Plan:   Problem List Items Addressed This Visit      Other   Lab test positive for detection of COVID-19 virus    Acute and improving.  She is off quarantine now, but continues to have a lingering cough without SOB.  Will send in script for Albuterol and refill Tessalon.  Discussed Covid affects with her and recommended continuing Prednisone until complete + ensure plenty of rest and fluids.  Return to office for worsening or ongoing symptoms.          Follow up plan: Return if symptoms worsen or fail to improve.

## 2019-11-11 NOTE — Assessment & Plan Note (Signed)
Acute and improving.  She is off quarantine now, but continues to have a lingering cough without SOB.  Will send in script for Albuterol and refill Tessalon.  Discussed Covid affects with her and recommended continuing Prednisone until complete + ensure plenty of rest and fluids.  Return to office for worsening or ongoing symptoms.

## 2019-11-11 NOTE — Patient Instructions (Signed)
COVID-19 COVID-19 is a respiratory infection that is caused by a virus called severe acute respiratory syndrome coronavirus 2 (SARS-CoV-2). The disease is also known as coronavirus disease or novel coronavirus. In some people, the virus may not cause any symptoms. In others, it may cause a serious infection. The infection can get worse quickly and can lead to complications, such as:  Pneumonia, or infection of the lungs.  Acute respiratory distress syndrome or ARDS. This is a condition in which fluid build-up in the lungs prevents the lungs from filling with air and passing oxygen into the blood.  Acute respiratory failure. This is a condition in which there is not enough oxygen passing from the lungs to the body or when carbon dioxide is not passing from the lungs out of the body.  Sepsis or septic shock. This is a serious bodily reaction to an infection.  Blood clotting problems.  Secondary infections due to bacteria or fungus.  Organ failure. This is when your body's organs stop working. The virus that causes COVID-19 is contagious. This means that it can spread from person to person through droplets from coughs and sneezes (respiratory secretions). What are the causes? This illness is caused by a virus. You may catch the virus by:  Breathing in droplets from an infected person. Droplets can be spread by a person breathing, speaking, singing, coughing, or sneezing.  Touching something, like a table or a doorknob, that was exposed to the virus (contaminated) and then touching your mouth, nose, or eyes. What increases the risk? Risk for infection You are more likely to be infected with this virus if you:  Are within 6 feet (2 meters) of a person with COVID-19.  Provide care for or live with a person who is infected with COVID-19.  Spend time in crowded indoor spaces or live in shared housing. Risk for serious illness You are more likely to become seriously ill from the virus if you:   Are 50 years of age or older. The higher your age, the more you are at risk for serious illness.  Live in a nursing home or long-term care facility.  Have cancer.  Have a long-term (chronic) disease such as: ? Chronic lung disease, including chronic obstructive pulmonary disease or asthma. ? A long-term disease that lowers your body's ability to fight infection (immunocompromised). ? Heart disease, including heart failure, a condition in which the arteries that lead to the heart become narrow or blocked (coronary artery disease), a disease which makes the heart muscle thick, weak, or stiff (cardiomyopathy). ? Diabetes. ? Chronic kidney disease. ? Sickle cell disease, a condition in which red blood cells have an abnormal "sickle" shape. ? Liver disease.  Are obese. What are the signs or symptoms? Symptoms of this condition can range from mild to severe. Symptoms may appear any time from 2 to 14 days after being exposed to the virus. They include:  A fever or chills.  A cough.  Difficulty breathing.  Headaches, body aches, or muscle aches.  Runny or stuffy (congested) nose.  A sore throat.  New loss of taste or smell. Some people may also have stomach problems, such as nausea, vomiting, or diarrhea. Other people may not have any symptoms of COVID-19. How is this diagnosed? This condition may be diagnosed based on:  Your signs and symptoms, especially if: ? You live in an area with a COVID-19 outbreak. ? You recently traveled to or from an area where the virus is common. ? You   provide care for or live with a person who was diagnosed with COVID-19. ? You were exposed to a person who was diagnosed with COVID-19.  A physical exam.  Lab tests, which may include: ? Taking a sample of fluid from the back of your nose and throat (nasopharyngeal fluid), your nose, or your throat using a swab. ? A sample of mucus from your lungs (sputum). ? Blood tests.  Imaging tests, which  may include, X-rays, CT scan, or ultrasound. How is this treated? At present, there is no medicine to treat COVID-19. Medicines that treat other diseases are being used on a trial basis to see if they are effective against COVID-19. Your health care provider will talk with you about ways to treat your symptoms. For most people, the infection is mild and can be managed at home with rest, fluids, and over-the-counter medicines. Treatment for a serious infection usually takes places in a hospital intensive care unit (ICU). It may include one or more of the following treatments. These treatments are given until your symptoms improve.  Receiving fluids and medicines through an IV.  Supplemental oxygen. Extra oxygen is given through a tube in the nose, a face mask, or a hood.  Positioning you to lie on your stomach (prone position). This makes it easier for oxygen to get into the lungs.  Continuous positive airway pressure (CPAP) or bi-level positive airway pressure (BPAP) machine. This treatment uses mild air pressure to keep the airways open. A tube that is connected to a motor delivers oxygen to the body.  Ventilator. This treatment moves air into and out of the lungs by using a tube that is placed in your windpipe.  Tracheostomy. This is a procedure to create a hole in the neck so that a breathing tube can be inserted.  Extracorporeal membrane oxygenation (ECMO). This procedure gives the lungs a chance to recover by taking over the functions of the heart and lungs. It supplies oxygen to the body and removes carbon dioxide. Follow these instructions at home: Lifestyle  If you are sick, stay home except to get medical care. Your health care provider will tell you how long to stay home. Call your health care provider before you go for medical care.  Rest at home as told by your health care provider.  Do not use any products that contain nicotine or tobacco, such as cigarettes, e-cigarettes, and  chewing tobacco. If you need help quitting, ask your health care provider.  Return to your normal activities as told by your health care provider. Ask your health care provider what activities are safe for you. General instructions  Take over-the-counter and prescription medicines only as told by your health care provider.  Drink enough fluid to keep your urine pale yellow.  Keep all follow-up visits as told by your health care provider. This is important. How is this prevented?  There is no vaccine to help prevent COVID-19 infection. However, there are steps you can take to protect yourself and others from this virus. To protect yourself:   Do not travel to areas where COVID-19 is a risk. The areas where COVID-19 is reported change often. To identify high-risk areas and travel restrictions, check the CDC travel website: wwwnc.cdc.gov/travel/notices  If you live in, or must travel to, an area where COVID-19 is a risk, take precautions to avoid infection. ? Stay away from people who are sick. ? Wash your hands often with soap and water for 20 seconds. If soap and water   are not available, use an alcohol-based hand sanitizer. ? Avoid touching your mouth, face, eyes, or nose. ? Avoid going out in public, follow guidance from your state and local health authorities. ? If you must go out in public, wear a cloth face covering or face mask. Make sure your mask covers your nose and mouth. ? Avoid crowded indoor spaces. Stay at least 6 feet (2 meters) away from others. ? Disinfect objects and surfaces that are frequently touched every day. This may include:  Counters and tables.  Doorknobs and light switches.  Sinks and faucets.  Electronics, such as phones, remote controls, keyboards, computers, and tablets. To protect others: If you have symptoms of COVID-19, take steps to prevent the virus from spreading to others.  If you think you have a COVID-19 infection, contact your health care  provider right away. Tell your health care team that you think you may have a COVID-19 infection.  Stay home. Leave your house only to seek medical care. Do not use public transport.  Do not travel while you are sick.  Wash your hands often with soap and water for 20 seconds. If soap and water are not available, use alcohol-based hand sanitizer.  Stay away from other members of your household. Let healthy household members care for children and pets, if possible. If you have to care for children or pets, wash your hands often and wear a mask. If possible, stay in your own room, separate from others. Use a different bathroom.  Make sure that all people in your household wash their hands well and often.  Cough or sneeze into a tissue or your sleeve or elbow. Do not cough or sneeze into your hand or into the air.  Wear a cloth face covering or face mask. Make sure your mask covers your nose and mouth. Where to find more information  Centers for Disease Control and Prevention: www.cdc.gov/coronavirus/2019-ncov/index.html  World Health Organization: www.who.int/health-topics/coronavirus Contact a health care provider if:  You live in or have traveled to an area where COVID-19 is a risk and you have symptoms of the infection.  You have had contact with someone who has COVID-19 and you have symptoms of the infection. Get help right away if:  You have trouble breathing.  You have pain or pressure in your chest.  You have confusion.  You have bluish lips and fingernails.  You have difficulty waking from sleep.  You have symptoms that get worse. These symptoms may represent a serious problem that is an emergency. Do not wait to see if the symptoms will go away. Get medical help right away. Call your local emergency services (911 in the [place] Do not drive yourself to the hospital. Let the emergency medical personnel know if you think you have COVID-19. Summary  COVID-19 is a  respiratory infection that is caused by a virus. It is also known as coronavirus disease or novel coronavirus. It can cause serious infections, such as pneumonia, acute respiratory distress syndrome, acute respiratory failure, or sepsis.  The virus that causes COVID-19 is contagious. This means that it can spread from person to person through droplets from breathing, speaking, singing, coughing, or sneezing.  You are more likely to develop a serious illness if you are 50 years of age or older, have a weak immune system, live in a nursing home, or have chronic disease.  There is no medicine to treat COVID-19. Your health care provider will talk with you about ways to treat your symptoms.    Take steps to protect yourself and others from infection. Wash your hands often and disinfect objects and surfaces that are frequently touched every day. Stay away from people who are sick and wear a mask if you are sick. This information is not intended to replace advice given to you by your health care provider. Make sure you discuss any questions you have with your health care provider. Document Revised: 06/19/2019 Document Reviewed: 09/25/2018 [employer]vier Patient Education  2020 [employer]  

## 2019-11-26 ENCOUNTER — Encounter: Payer: Self-pay | Admitting: Nurse Practitioner

## 2019-11-27 ENCOUNTER — Other Ambulatory Visit: Payer: Self-pay | Admitting: Nurse Practitioner

## 2019-11-27 MED ORDER — LEVOTHYROXINE SODIUM 75 MCG PO TABS: 75.0000 ug | ORAL_TABLET | Freq: Every day | ORAL | 4 refills | Status: DC

## 2019-12-23 ENCOUNTER — Other Ambulatory Visit: Payer: Self-pay | Admitting: Nurse Practitioner

## 2019-12-23 NOTE — Telephone Encounter (Signed)
Requested Prescriptions  Pending Prescriptions Disp Refills  . famotidine (PEPCID) 20 MG tablet [Pharmacy Med Name: FAMOTIDINE 20 MG TABLET] 90 tablet 3    Sig: TAKE 1 TABLET (20 MG TOTAL) BY MOUTH ONCE DAILY DISCONTINUE ZANTAC     Gastroenterology:  H2 Antagonists Passed - 12/23/2019  3:22 AM      Passed - Valid encounter within last 12 months    Recent Outpatient Visits          1 month ago Lab test positive for detection of COVID-19 virus   [facility], Henrine Screws T, NP   1 month ago COVID-19 virus infection   [facility], [place], [place]   3 months ago Acquired hypothyroidism   [place], Holloway T, NP   5 months ago Swelling in right armpit   [place], Megan P, DO   8 months ago Chronic migraine without aura without status migrainosus, not intractable   [place], Barbaraann Faster, NP      Future Appointments            In 1 month MacDiarm[place], Nicki Reaper, MD [place]   In 2 months [place], Barbaraann Faster, NP [employer], [place]

## 2019-12-28 ENCOUNTER — Encounter: Payer: Self-pay | Admitting: Nurse Practitioner

## 2019-12-29 ENCOUNTER — Telehealth: Payer: Self-pay | Admitting: Nurse Practitioner

## 2019-12-29 NOTE — Telephone Encounter (Signed)
Noted, thank you

## 2019-12-29 NOTE — Telephone Encounter (Signed)
Copied from [place] [phone]. Topic: General - Inquiry >> Dec 29, 2019  8:20 AM Richardo Priest, NT wrote: Reason for CRM: Patient called in stating she has had one lingering side effect from having covid, that being the back pain more than body aches. Patient is wondering if it is still okay for her to receive vaccination with that lingering side effect. Please advise.

## 2019-12-29 NOTE — Telephone Encounter (Signed)
Called pt and relayed Henrine Screws' s message. Pt verbalized understanding.

## 2019-12-29 NOTE — Telephone Encounter (Signed)
Yes, she should be okay to receive vaccine if no ongoing respiratory symptoms.

## 2019-12-31 ENCOUNTER — Encounter: Payer: Self-pay | Admitting: Nurse Practitioner

## 2020-01-01 ENCOUNTER — Ambulatory Visit: Payer: BC Managed Care – PPO

## 2020-01-01 ENCOUNTER — Other Ambulatory Visit: Payer: Self-pay | Admitting: Nurse Practitioner

## 2020-01-01 DIAGNOSIS — Z1152 Encounter for screening for COVID-19: Secondary | ICD-10-CM | POA: Diagnosis not present

## 2020-01-01 NOTE — Progress Notes (Signed)
Covid antibody testing

## 2020-01-02 LAB — SAR COV2 SEROLOGY (COVID19)AB(IGG),IA: DiaSorin SARS-CoV-2 Ab, IgG: POSITIVE

## 2020-01-07 NOTE — Progress Notes (Signed)
Contacted via [place] morning Cindy Jefferson, Covid antibody testing did return positive.  Of course this means you did have the virus, which we know of, and do have antibodies at this time.  If you wish to hold off on vaccine another month or so, that should be fine, but I do recommend obtaining the vaccine in the upcoming months as at this time Covid antibodies from infection itself are not showing to be long term.  Have a great day!!

## 2020-01-13 DIAGNOSIS — Z8616 Personal history of COVID-19: Secondary | ICD-10-CM | POA: Diagnosis not present

## 2020-01-13 DIAGNOSIS — M542 Cervicalgia: Secondary | ICD-10-CM | POA: Diagnosis not present

## 2020-01-13 DIAGNOSIS — G43119 Migraine with aura, intractable, without status migrainosus: Secondary | ICD-10-CM | POA: Diagnosis not present

## 2020-01-19 ENCOUNTER — Ambulatory Visit: Payer: BC Managed Care – PPO | Attending: Internal Medicine

## 2020-01-19 DIAGNOSIS — Z23 Encounter for immunization: Secondary | ICD-10-CM

## 2020-01-19 NOTE — Progress Notes (Signed)
   Covid-19 Vaccination Clinic  Name:  Cindy Jefferson    MRN: [id number] DOB: 26-Apr-1956  01/19/2020  Ms. Cindy Jefferson was observed post Covid-19 immunization for 15 minutes without incident. She was provided with Vaccine Information Sheet and instruction to access the V-Safe system.   Ms. Cindy Jefferson was instructed to call 911 with any severe reactions post vaccine: Marland Kitchen Difficulty breathing  . Swelling of face and throat  . A fast heartbeat  . A bad rash all over body  . Dizziness and weakness   Immunizations Administered    Name Date Dose VIS Date Route   Pfizer COVID-19 Vaccine 01/19/2020 12:01 PM 0.3 mL 10/28/2018 Intramuscular   Manufacturer: [place]   Lot: [id number]   [place]: [id number]

## 2020-02-08 ENCOUNTER — Ambulatory Visit: Payer: BC Managed Care – PPO | Admitting: Urology

## 2020-02-16 ENCOUNTER — Ambulatory Visit: Payer: BC Managed Care – PPO | Attending: Internal Medicine

## 2020-02-16 DIAGNOSIS — Z23 Encounter for immunization: Secondary | ICD-10-CM

## 2020-02-16 NOTE — Progress Notes (Signed)
   Covid-19 Vaccination Clinic  Name:  Delena Casebeer    MRN: [id number] DOB: [birth year]  02/16/2020  Ms. Headrick was observed post Covid-19 immunization for 15 minutes without incident. She was provided with Vaccine Information Sheet and instruction to access the V-Safe system.   Ms. Spinney was instructed to call 911 with any severe reactions post vaccine: Marland Kitchen Difficulty breathing  . Swelling of face and throat  . A fast heartbeat  . A bad rash all over body  . Dizziness and weakness   Immunizations Administered    Name Date Dose VIS Date Route   Pfizer COVID-19 Vaccine 02/16/2020  3:24 PM 0.3 mL 10/28/2018 Intramuscular   Manufacturer: [place]   Lot: [id number]   [place]: [id number]

## 2020-02-21 IMAGING — MR MR MRV HEAD WO/W CM
2 series · 48 of 48 positions shown · IV contrast (7ml Gadavist)
Comparison: None.

CLINICAL DATA: Sudden onset posterior headache

EXAM:
MRI HEAD WITHOUT CONTRAST
MRA HEAD WITHOUT CONTRAST
MRV HEAD WITHOUT AND WITH CONTRAST
TECHNIQUE: Multiplanar, multiecho pulse sequences of the brain and surrounding
structures were obtained without intravenous contrast. Angiographic
images of the head were obtained using MRA technique without
contrast.
Angiographic images of the intracranial venous structures were
obtained using MRV technique without and with intravenous contrast.

[Series 27: tof_2d_paracor · coronal · 2.5mm · 0.98mm/px · 19 of 127 slices shown]
[im 1/127]
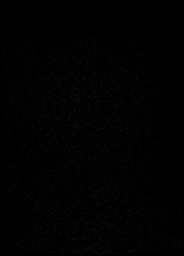
[im 8/127]
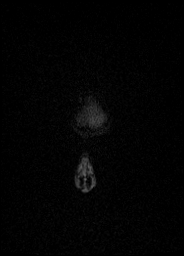
[im 15/127]
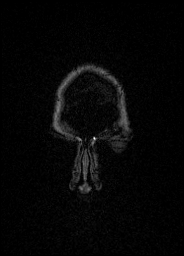
[im 22/127]
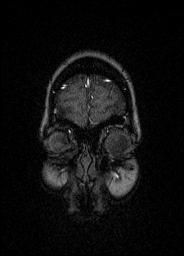
[im 29/127]
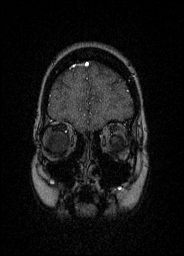
[im 36/127]
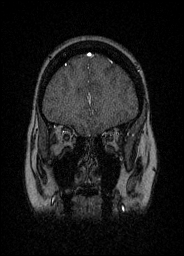
[im 43/127]
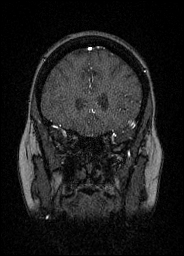
[im 50/127]
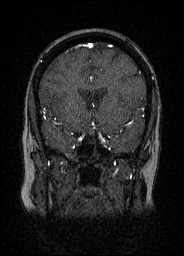
[im 57/127]
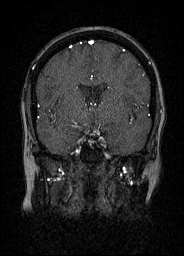
[im 64/127]
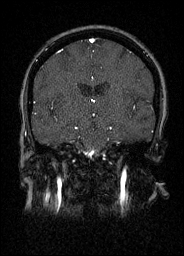
[im 71/127]
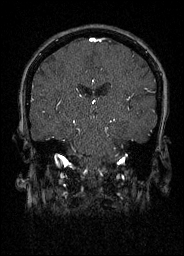
[im 78/127]
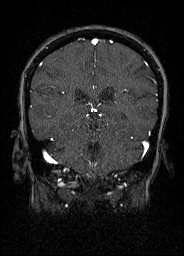
[im 85/127]
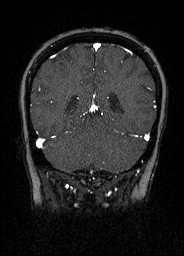
[im 92/127]
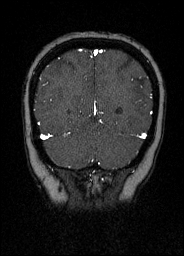
[im 99/127]
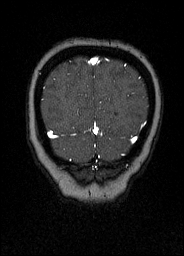
[im 106/127]
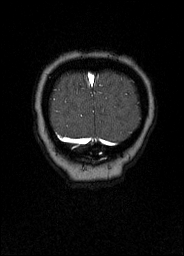
[im 113/127]
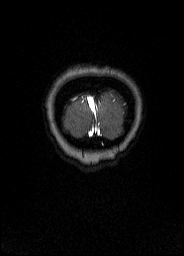
[im 120/127]
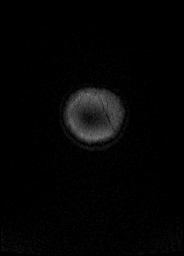
[im 127/127]
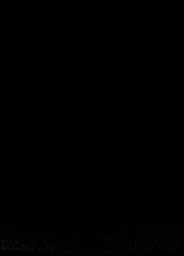

[Series 31: [id number]_iso · sagittal · 1.0mm · 0.98mm/px · 29 of 190 slices shown]
[im 1/190]
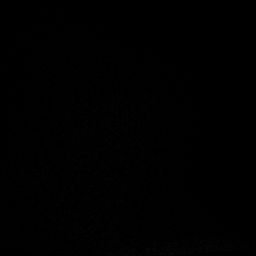
[im 7/190]
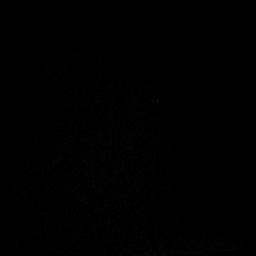
[im 14/190]
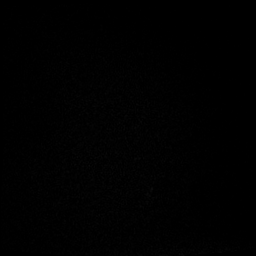
[im 21/190]
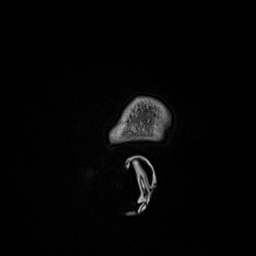
[im 28/190]
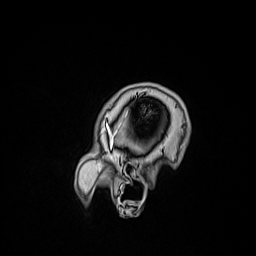
[im 34/190]
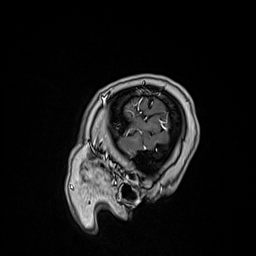
[im 41/190]
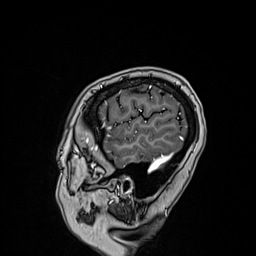
[im 48/190]
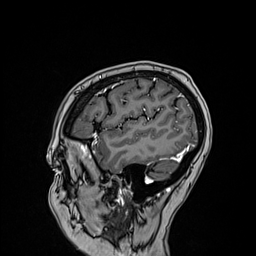
[im 55/190]
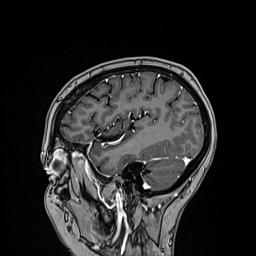
[im 61/190]
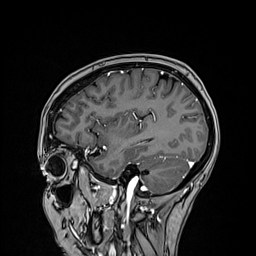
[im 68/190]
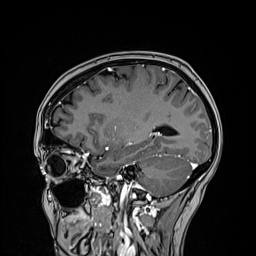
[im 75/190]
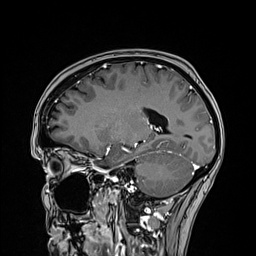
[im 82/190]
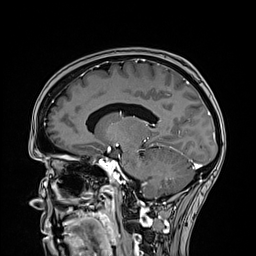
[im 88/190]
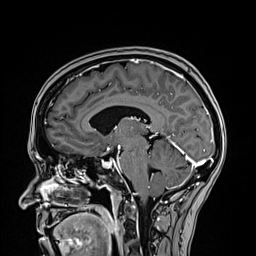
[im 95/190]
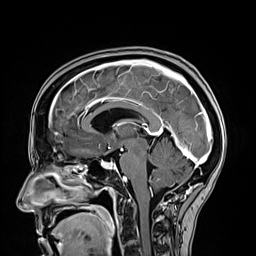
[im 102/190]
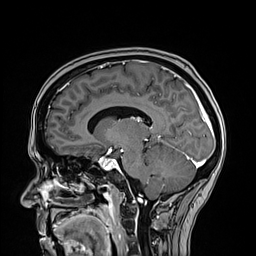
[im 109/190]
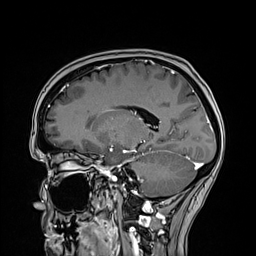
[im 115/190]
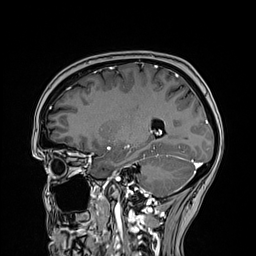
[im 122/190]
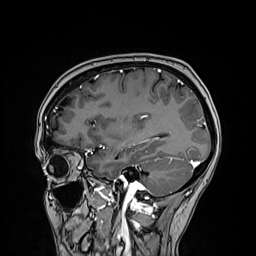
[im 129/190]
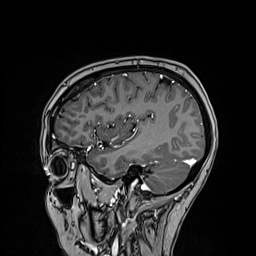
[im 136/190]
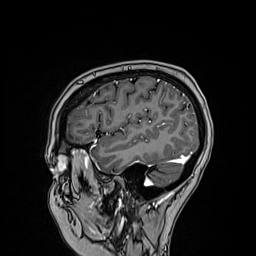
[im 142/190]
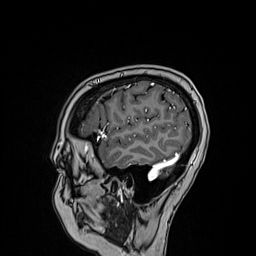
[im 149/190]
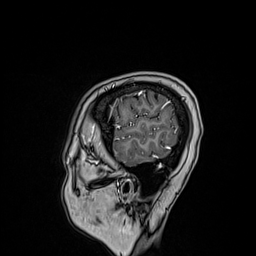
[im 156/190]
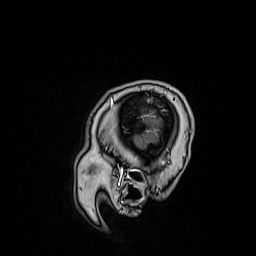
[im 163/190]
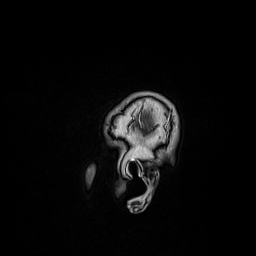
[im 169/190]
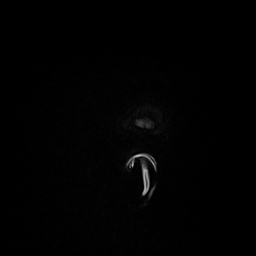
[im 176/190]
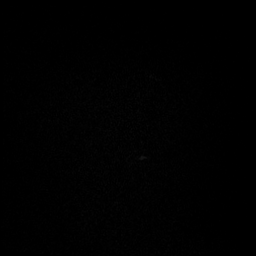
[im 183/190]
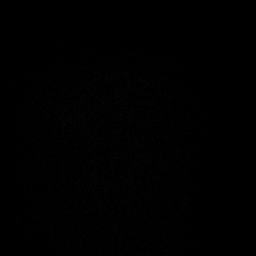
[im 190/190]
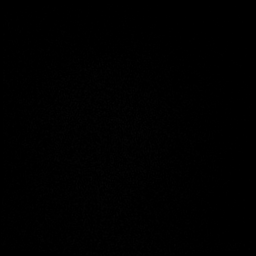

[48 of 48 positions shown; findings below may reference images not displayed]

FINDINGS: MRI HEAD

Brain: There is no acute infarction or intracranial hemorrhage.
There is no intracranial mass, mass effect, or edema. There is no
hydrocephalus or extra-axial fluid collection. Few scattered small
foci of T2 hyperintensity in the supratentorial white matter are
nonspecific but may reflect minor chronic microvascular ischemic
changes. No abnormal enhancement.

Vascular: Major vessel flow voids at the skull base are preserved.

Skull and upper cervical spine: Normal marrow signal is preserved.

Sinuses/Orbits: Paranasal sinuses are aerated. Orbits are
unremarkable.

Other: Sella is unremarkable.  Mastoid air cells are clear.

MRA HEAD

Intracranial internal carotid arteries are patent. Middle and
anterior cerebral arteries are patent. Intracranial vertebral
arteries, basilar artery, posterior cerebral arteries are patent. A
posterior communicating artery is identified on the left. There is
no significant stenosis or aneurysm.

MRV HEAD

The superior sagittal sinus, straight sinus, vein of [place], internal
cerebral veins, and both transverse is sigmoid sinuses are patent.
There is no evidence of venous thrombosis.
IMPRESSION: No evidence of recent infarction, hemorrhage, or mass. Minor chronic
microvascular ischemic changes.

No hemodynamically significant stenosis or aneurysm.

No evidence of venous thrombosis.

## 2020-02-21 IMAGING — CT CT HEAD W/O CM
3 series · 15 of 45 positions shown, 18 images · non-contrast
Comparison: None.

CLINICAL DATA: Acute posterior headache

EXAM:
CT HEAD WITHOUT CONTRAST
TECHNIQUE: Contiguous axial images were obtained from the base of the skull
through the vertex without intravenous contrast.

[Series 2: head wo · axial · 0.40mm/px · z∈[-60,+55]mm · 9 of 28 slices shown, 12 images]
[im 3/28  brain]
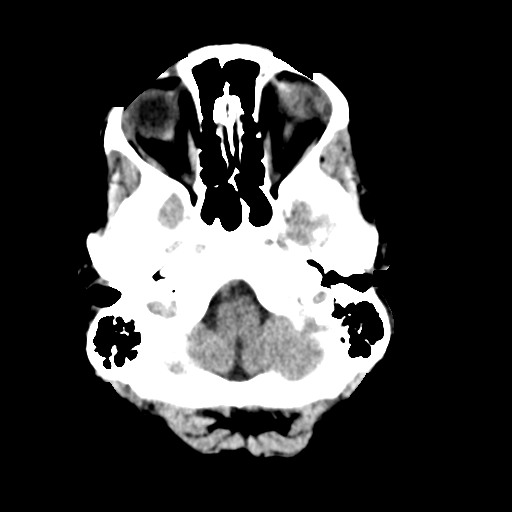
[im 3/28  bone]
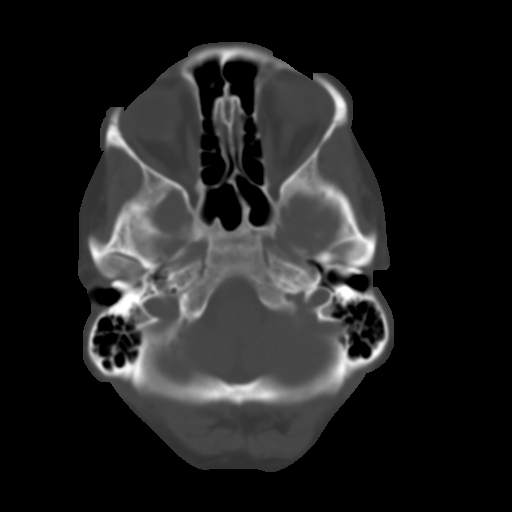
[im 6/28  brain]
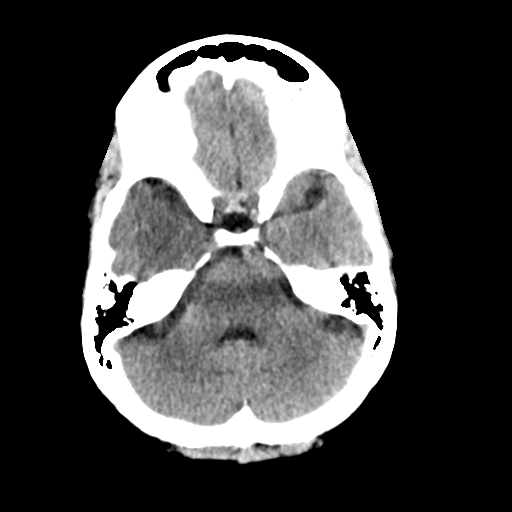
[im 9/28  brain]
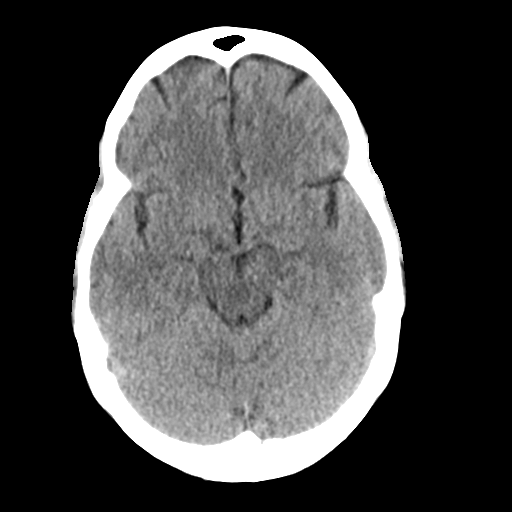
[im 12/28  brain]
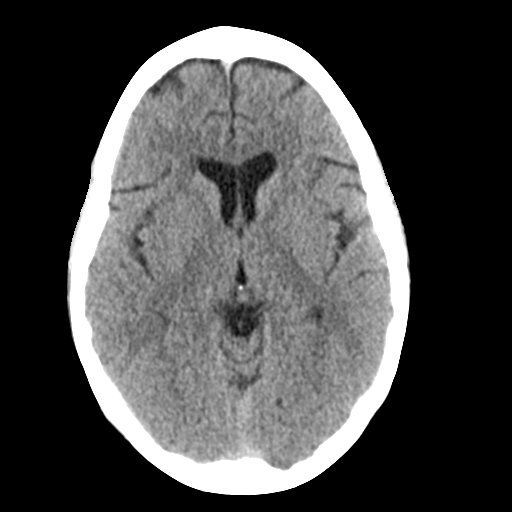
[im 15/28  brain]
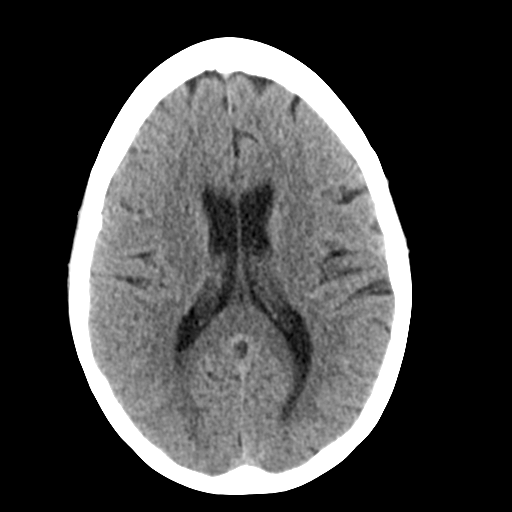
[im 15/28  bone]
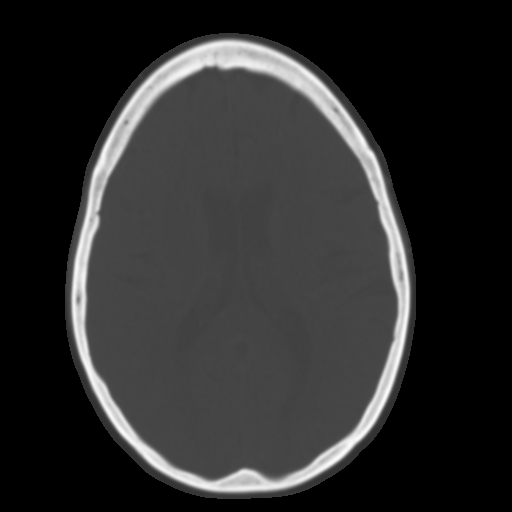
[im 17/28  brain]
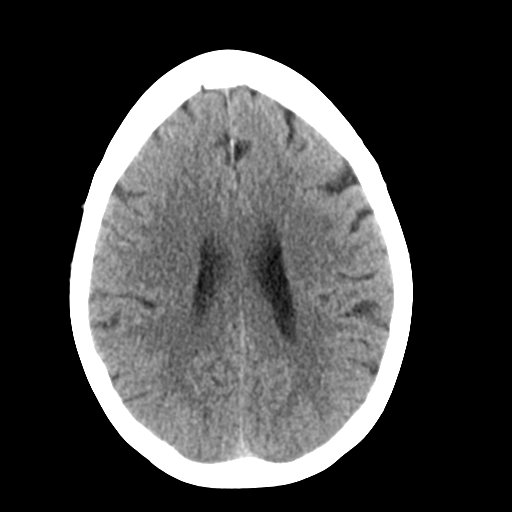
[im 20/28  brain]
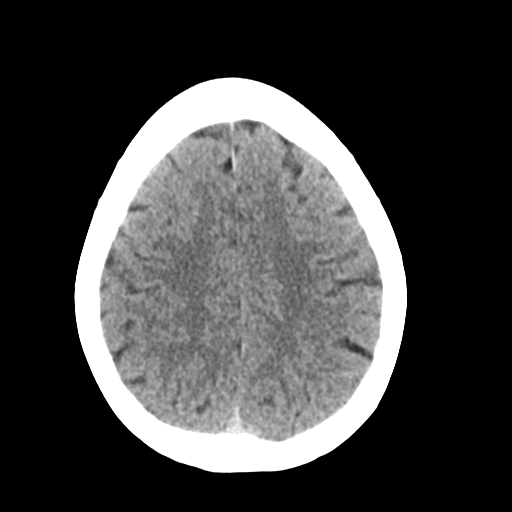
[im 23/28  brain]
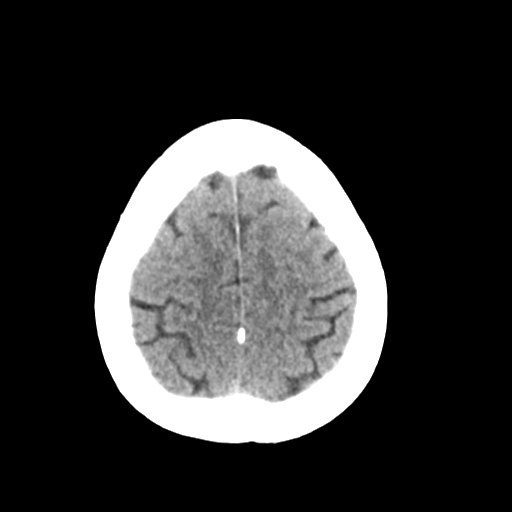
[im 26/28  brain]
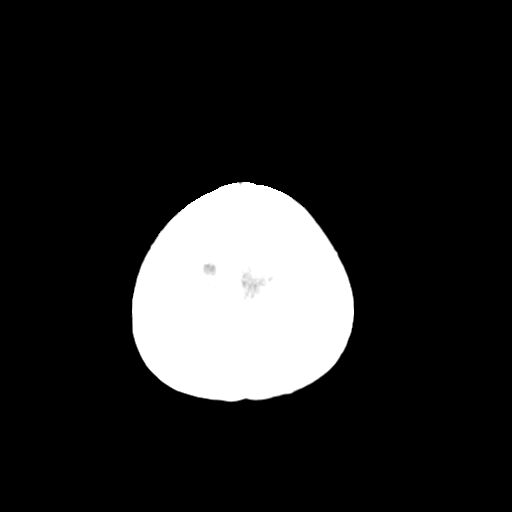
[im 26/28  bone]
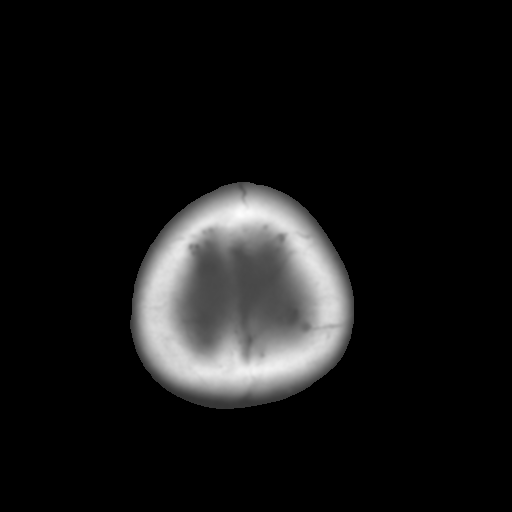

[Series 4: coronal soft tissue · coronal · 0.28mm/px · 3 of 67 slices shown]
[im 23/67  brain]
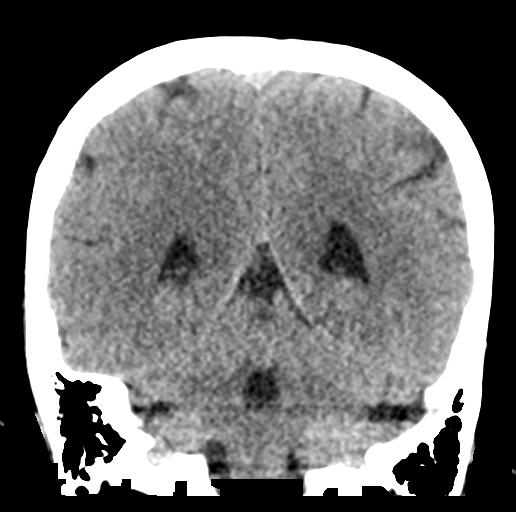
[im 30/67  brain]
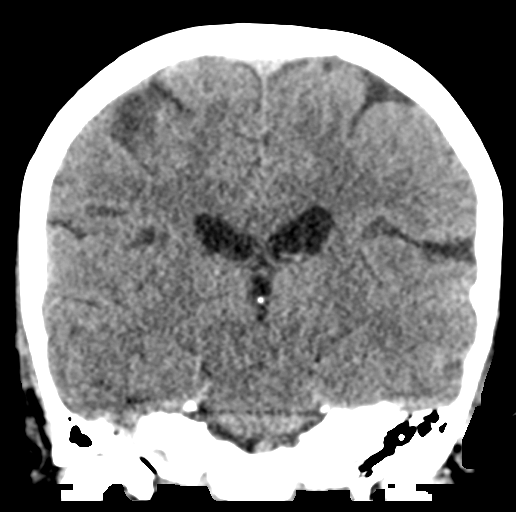
[im 37/67  brain]
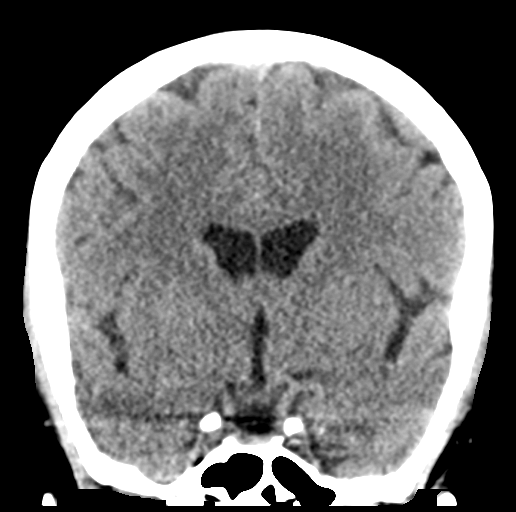

[Series 5: sagittal soft tissue · sagittal · 0.30mm/px · 3 of 67 slices shown]
[im 23/67  brain]
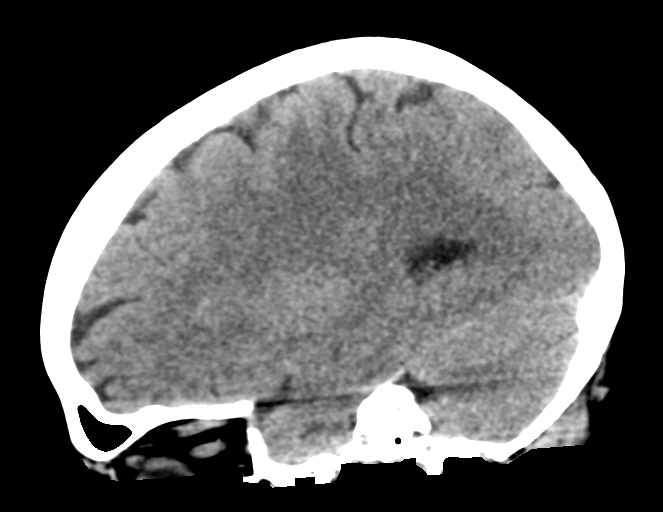
[im 34/67  brain]
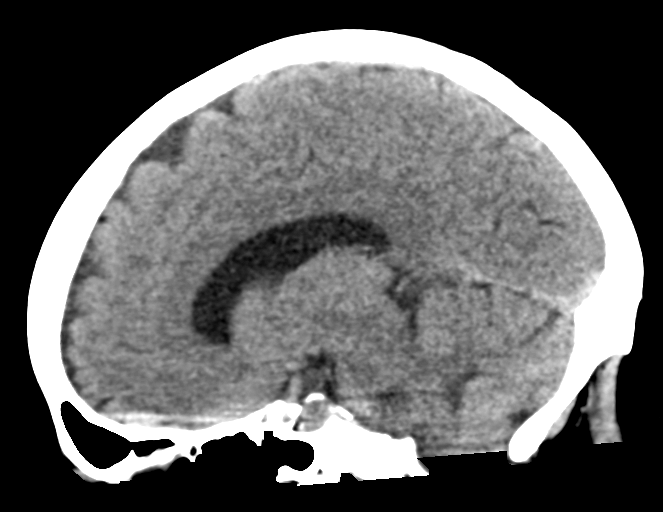
[im 45/67  brain]
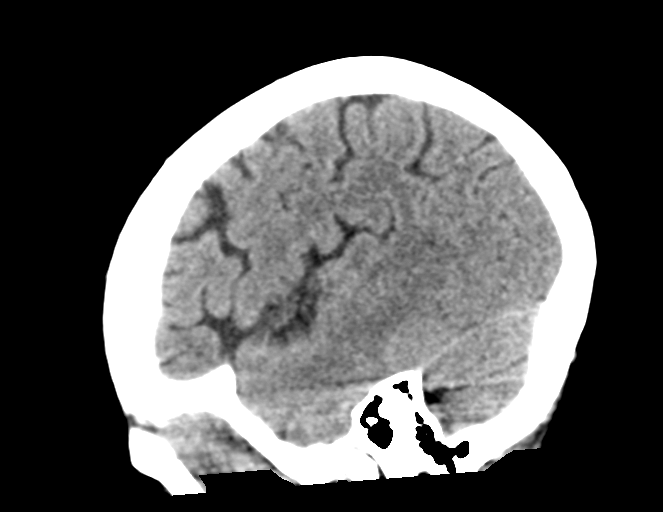

[15 of 45 positions shown; findings below may reference images not displayed]

FINDINGS: Brain: The right temporal lobe appears relatively low density with
loss of the gray-white matter differentiation (series 2, images
22-23). However, there is beam hardening artifact related to the
osseous structures of the skull base within this area (series 5,
image 19), which is favored to artifactually explain these findings.

Elsewhere within the brain, no additional acute findings. No
intraparenchymal hemorrhage. No extra-axial fluid collection. No
hydrocephalus.

Vascular: Mild atherosclerotic calcifications involving the large
vessels of the skull base. No unexpected hyperdense vessel.

Skull: Normal. Negative for fracture or focal lesion.

Sinuses/Orbits: No acute finding.

Other: None.
IMPRESSION: The right temporal lobe appears relatively low density with loss of
the gray-white matter differentiation. However, there is prominent
beam hardening artifact related to the osseous structures of the
skull base within this area, which is favored to artifactually
explain these findings. MRI of the brain can be performed to exclude
the presence of an acute infarction within this region.

## 2020-02-29 ENCOUNTER — Other Ambulatory Visit: Payer: Self-pay

## 2020-02-29 ENCOUNTER — Encounter: Payer: Self-pay | Admitting: Nurse Practitioner

## 2020-02-29 ENCOUNTER — Ambulatory Visit (INDEPENDENT_AMBULATORY_CARE_PROVIDER_SITE_OTHER): Payer: BC Managed Care – PPO | Admitting: Nurse Practitioner

## 2020-02-29 VITALS — BP 118/72 | HR 66 | Temp 97.4°F | Ht 66.6 in | Wt 167.6 lb

## 2020-02-29 DIAGNOSIS — Z0000 Encounter for general adult medical examination without abnormal findings: Secondary | ICD-10-CM | POA: Diagnosis not present

## 2020-02-29 DIAGNOSIS — M810 Age-related osteoporosis without current pathological fracture: Secondary | ICD-10-CM

## 2020-02-29 DIAGNOSIS — E7800 Pure hypercholesterolemia, unspecified: Secondary | ICD-10-CM

## 2020-02-29 DIAGNOSIS — G43119 Migraine with aura, intractable, without status migrainosus: Secondary | ICD-10-CM | POA: Diagnosis not present

## 2020-02-29 DIAGNOSIS — R238 Other skin changes: Secondary | ICD-10-CM | POA: Diagnosis not present

## 2020-02-29 DIAGNOSIS — Z1231 Encounter for screening mammogram for malignant neoplasm of breast: Secondary | ICD-10-CM | POA: Diagnosis not present

## 2020-02-29 DIAGNOSIS — K219 Gastro-esophageal reflux disease without esophagitis: Secondary | ICD-10-CM

## 2020-02-29 DIAGNOSIS — E663 Overweight: Secondary | ICD-10-CM

## 2020-02-29 DIAGNOSIS — E039 Hypothyroidism, unspecified: Secondary | ICD-10-CM | POA: Diagnosis not present

## 2020-02-29 DIAGNOSIS — R233 Spontaneous ecchymoses: Secondary | ICD-10-CM

## 2020-02-29 NOTE — Assessment & Plan Note (Signed)
Chronic, ongoing.  Continue current medication regimen and adjust dose as needed. Mag level today. 

## 2020-02-29 NOTE — Patient Instructions (Addendum)
[facility]  Address: [address], [place], [place] [address]  Phone: [phone]   Preventing High Cholesterol Cholesterol is a white, waxy substance similar to fat that the human body needs to help build cells. The liver makes all the cholesterol that a person's body needs. Having high cholesterol (hypercholesterolemia) increases a person's risk for heart disease and stroke. Extra (excess) cholesterol comes from the food the person eats. High cholesterol can often be prevented with diet and lifestyle changes. If you already have high cholesterol, you can control it with diet and lifestyle changes and with medicine. How can high cholesterol affect me? If you have high cholesterol, deposits (plaques) may build up on the walls of your arteries. The arteries are the blood vessels that carry blood away from your heart. Plaques make the arteries narrower and stiffer. This can limit or block blood flow and cause blood clots to form. Blood clots:  Are tiny balls of cells that form in your blood.  Can move to the heart or brain, causing a heart attack or stroke. Plaques in arteries greatly increase your risk for heart attack and stroke.Making diet and lifestyle changes can reduce your risk for these conditions that may threaten your life. What can increase my risk? This condition is more likely to develop in people who:  Eat foods that are high in saturated fat or cholesterol. Saturated fat is mostly found in: ? Foods that contain animal fat, such as red meat and some dairy products. ? Certain fatty foods made from plants, such as tropical oils.  Are overweight.  Are not getting enough exercise.  Have a family history of high cholesterol. What actions can I take to prevent this? Nutrition   Eat less saturated fat.  Avoid trans fats (partially hydrogenated oils). These are often found in margarine and in some baked goods, fried foods, and snacks  bought in packages.  Avoid precooked or cured meat, such as sausages or meat loaves.  Avoid foods and drinks that have added sugars.  Eat more fruits, vegetables, and whole grains.  Choose healthy sources of protein, such as fish, poultry, lean cuts of red meat, beans, peas, lentils, and nuts.  Choose healthy sources of fat, such as: ? Nuts. ? Vegetable oils, especially olive oil. ? Fish that have healthy fats (omega-3 fatty acids), such as mackerel or salmon. The items listed above may not be a complete list of recommended foods and beverages. Contact a dietitian for more information. Lifestyle  Lose weight if you are overweight. Losing 5-10 lb (2.3-4.5 kg) can help prevent or control high cholesterol. It can also lower your risk for diabetes and high blood pressure. Ask your health care provider to help you with a diet and exercise plan to lose weight safely.  Do not use any products that contain nicotine or tobacco, such as cigarettes, e-cigarettes, and chewing tobacco. If you need help quitting, ask your health care provider.  Limit your alcohol intake. ? Do not drink alcohol if:  Your health care provider tells you not to drink.  You are pregnant, may be pregnant, or are planning to become pregnant. ? If you drink alcohol:  Limit how much you use to:  0-1 drink a day for women.  0-2 drinks a day for men.  Be aware of how much alcohol is in your drink. In the [place] one drink equals one 12 oz bottle of beer (355 mL), one 5 oz glass of wine (  148 mL), or one 1 oz glass of hard liquor (44 mL). Activity   Get enough exercise. Each week, do at least 150 minutes of exercise that takes a medium level of effort (moderate-intensity exercise). ? This is exercise that:  Makes your heart beat faster and makes you breathe harder than usual.  Allows you to still be able to talk. ? You could exercise in short sessions several times a day or longer sessions a few times a week. For  example, on 5 days each week, you could walk fast or ride your bike 3 times a day for 10 minutes each time.  Do exercises as told by your health care provider. Medicines  In addition to diet and lifestyle changes, your health care provider may recommend medicines to help lower cholesterol. This may be a medicine to lower the amount of cholesterol your liver makes. You may need medicine if: ? Diet and lifestyle changes do not lower your cholesterol enough. ? You have high cholesterol and other risk factors for heart disease or stroke.  Take over-the-counter and prescription medicines only as told by your health care provider. General information  Manage your risk factors for high cholesterol. Talk with your health care provider about all your risk factors and how to lower your risk.  Manage other conditions that you have, such as diabetes or high blood pressure (hypertension).  Have blood tests to check your cholesterol levels at regular points in time as told by your health care provider.  Keep all follow-up visits as told by your health care provider. This is important. Where to find more information  American Heart Association: www.heart.org  National Heart, Lung, and Blood Institute: https://wilson-eaton.com/ Summary  High cholesterol increases your risk for heart disease and stroke. By keeping your cholesterol level low, you can reduce your risk for these conditions.  High cholesterol can often be prevented with diet and lifestyle changes.  Work with your health care provider to manage your risk factors, and have your blood tested regularly. This information is not intended to replace advice given to you by your health care provider. Make sure you discuss any questions you have with your health care provider. Document Revised: 12/12/2018 Document Reviewed: 04/28/2016 Elsevier Patient Education  2020 [employer].

## 2020-02-29 NOTE — Assessment & Plan Note (Signed)
Recommended eating smaller high protein, low fat meals more frequently and exercising 30 mins a day 5 times a week with a goal of 10-15lb weight loss in the next 3 months. Patient voiced their understanding and motivation to adhere to these recommendations.  

## 2020-02-29 NOTE — Assessment & Plan Note (Signed)
Ongoing, with ASCVD 3.4%.  Continue focus on diet regimen, discussed at length with her.  She may trial red yeast rice.  Poor tolerance to fish oil due to GERD.   Return in 6 months.  Check lipid panel today.

## 2020-02-29 NOTE — Assessment & Plan Note (Signed)
Chronic, stable with TSH within normal range.  Continue current regimen and adjust as needed.  Plan on checking thyroid levels annually or sooner if symptomatic.   Return in 6 months.  Check CBC today.

## 2020-02-29 NOTE — Assessment & Plan Note (Signed)
Chronic, last DEXA June 2019.  Continue Vitamin D, may need to utilize higher weekly doses in future as level 29.1 on recent labs.  Recheck level today and DEXA order placed.  Consider bisphosphonate if ongoing osteoporosis -- if poorly tolerated consider injectable.

## 2020-02-29 NOTE — Progress Notes (Signed)
BP 118/72   Pulse 66   Temp (!) 97.4 F (36.3 C) (Oral)   Ht 5' 6.6" (1.692 m)   Wt 167 lb 9.6 oz (76 kg)   SpO2 99%   BMI 26.57 kg/m    Subjective:    Patient ID: Cindy Jefferson, female    DOB: 16-Aug-1956, 64 y.o.   MRN: [id number]  HPI: Cindy Jefferson is a 64 y.o. female presenting on 02/29/2020 for comprehensive medical examination. Current medical complaints include:none  She currently lives with: husband and mother-in-law Menopausal Symptoms: no   HYPOTHYROIDISM Recent TSH 2.790 in January 2021 and continues on Levothyroxine 75 MCG.  She does endorse concerns about some bruises, reports her previous provider sent her to hematology and she was told she has Hashimoto's, but gets concerns when she bruises easily as her father passed from leukemia years ago. Thyroid control status:stable Satisfied with current treatment? yes Medication side effects: no Medication compliance: good compliance Etiology of hypothyroidism:  Recent dose adjustment:no Fatigue: no Cold intolerance: no Heat intolerance: no Weight gain: no Weight loss: no Constipation: no Diarrhea/loose stools: no Palpitations: no Lower extremity edema: no Anxiety/depressed mood: no   OSTEOPOROSIS Last DEXA in 2019 with hip T score -2.5 and femur -2.3.  Will repeat DEXA in June 2021.  No current bisphosphonate -- she had been on something in past.  Vitamin D level in January 2021 -- 29. Satisfied with current treatment?: yes Medication side effects: no Medication compliance: good compliance Past osteoporosis medications/treatments: calcium and Vit D Adequate calcium & vitamin D: yes Weight bearing exercises: yes   HYPERLIPIDEMIA No current medications.  Diet focused.  Last labs in January 2021 -- TCHOL 233 and LDL 131. Hyperlipidemia status: good compliance Supplements: none Aspirin:  no The 10-year ASCVD risk score Mikey Bussing DC Jr., et al., 2013) is: 3.4%   Values used to calculate the score:      Age: 8 years     Sex: Female     Is Non-Hispanic African American: No     Diabetic: No     Tobacco smoker: No     Systolic Blood Pressure: 782 mmHg     Is BP treated: No     HDL Cholesterol: 82 mg/dL     Total Cholesterol: 233 mg/dL Chest pain:  no Coronary artery disease:  no Family history CAD:  yes Family history early CAD:  no   GERD Continues on Pepcid as needed, has hardly had to take it. GERD control status: stable  Satisfied with current treatment? yes Heartburn frequency: minimal Medication side effects: no  Medication compliance: stable Previous GERD medications: Antacid use frequency:  none Dysphagia: no Odynophagia:  no Hematemesis: no Blood in stool: no EGD: yes   MIGRAINES Has had migraines for 30 years or more. Currently followed by neurology, Dr. Manuella Ghazi, with last visit 01/13/20. Is on Ajovy.  Takes occasional Nurtec. Previously had been on Depakote for maintenance, but got pancreatitis with this and stopped. Has never had an aura. At baseline her migraines present to side of head around, often right side. Has underlying cervical disc disease. Duration:chronic Onset:gradual Location:often right side of head, but sometimes left Headache duration:72 hours at baseline Radiation:yes around right eye and side of head when present Time of day headache occurs:varies Alleviating factors:Relpax and occasionally Ibuprofen Aggravating factors:unknown Headache status at time of visit:none Treatments attempted:Rimegepant, Relpax, Ibuprofen, and Depakote in past which caused pancreatitis Nausea:no Vomiting:no Photophobia:no Phonophobia:no Effect on social functioning:yes RED FLAGS: Confusion:no  Gait disturbance/ataxia:no Behavioral changes:no Fevers:no  Depression Screen done today and results listed below:  Depression screen [facility] 2/9 02/29/2020 01/28/2019 09/15/2018  Decreased Interest 0 0 0  Down, Depressed, Hopeless 0 0 0  PHQ -  2 Score 0 0 0  Altered sleeping - 0 0  Tired, decreased energy - 0 0  Change in appetite - 0 0  Feeling bad or failure about yourself  - 0 0  Trouble concentrating - 0 0  Moving slowly or fidgety/restless - 0 0  Suicidal thoughts - 0 0  PHQ-9 Score - 0 0  Difficult doing work/chores - Not difficult at all Not difficult at all    The patient does not have a history of falls. I did not complete a risk assessment for falls. A plan of care for falls was not documented.   Past Medical History:  Past Medical History:  Diagnosis Date  . GERD (gastroesophageal reflux disease)   . Migraine   . Migraines   . Pancreatitis   . Thyroid disease     Surgical History:  Past Surgical History:  Procedure Laterality Date  . ABDOMINAL HYSTERECTOMY     total, does not get pap smears   . CHOLECYSTECTOMY    . FOOT SURGERY    . neck fusion      Medications:  Current Outpatient Medications on File Prior to Visit  Medication Sig  . AJOVY 225 MG/1.5ML SOAJ Inject 1.5 mLs into the skin every 28 (twenty-eight) days.  . Calcium-Cholecalciferol (CALCET PETITES) 200-250 MG-UNIT TABS Take by mouth.  . Cholecalciferol (VITAMIN D3) 2000 units capsule Take by mouth.  . diclofenac sodium (VOLTAREN) 1 % GEL Apply 2 g topically as needed (four times a day as needed for neck pain).  . famotidine (PEPCID) 20 MG tablet TAKE 1 TABLET (20 MG TOTAL) BY MOUTH ONCE DAILY DISCONTINUE ZANTAC  . levothyroxine (SYNTHROID) 75 MCG tablet Take 1 tablet (75 mcg total) by mouth daily.  . magnesium oxide (MAG-OX) 400 MG tablet Take 400 mg by mouth daily.   . mometasone (NASONEX) 50 MCG/ACT nasal spray Place into the nose.  . NURTEC 75 MG TBDP TAKE 75 MG BY MOUTH ONCE DAILY AS NEEDED  . albuterol (VENTOLIN HFA) 108 (90 Base) MCG/ACT inhaler Inhale 2 puffs into the lungs every 6 (six) hours as needed for wheezing or shortness of breath. (Patient not taking: Reported on 02/29/2020)   No current facility-administered medications  on file prior to visit.    Allergies:  Allergies  Allergen Reactions  . Valproic Acid Other (See Comments)    Pancreatitis   . Erythromycin Nausea Only  . Tape Other (See Comments)    Skin Irritation      Social History:  Social History   Socioeconomic History  . Marital status: Married    Spouse name: Not on file  . Number of children: 2  . Years of education: Not on file  . Highest education level: Not on file  Occupational History  . Occupation: retired  Tobacco Use  . Smoking status: Never Smoker  . Smokeless tobacco: Never Used  Vaping Use  . Vaping Use: Never used  Substance and Sexual Activity  . Alcohol use: Not Currently  . Drug use: Never  . Sexual activity: Yes  Other Topics Concern  . Not on file  Social History Narrative  . Not on file   Social Determinants of Health   Financial Resource Strain:   . Difficulty of Paying Living Expenses:  Food Insecurity:   . Worried About Charity fundraiser in the Last Year:   . Arboriculturist in the Last Year:   Transportation Needs:   . Film/video editor (Medical):   Marland Kitchen Lack of Transportation (Non-Medical):   Physical Activity:   . Days of Exercise per Week:   . Minutes of Exercise per Session:   Stress:   . Feeling of Stress :   Social Connections:   . Frequency of Communication with Friends and Family:   . Frequency of Social Gatherings with Friends and Family:   . Attends Religious Services:   . Active Member of Clubs or Organizations:   . Attends Archivist Meetings:   Marland Kitchen Marital Status:   Intimate Partner Violence:   . Fear of Current or Ex-Partner:   . Emotionally Abused:   Marland Kitchen Physically Abused:   . Sexually Abused:    Social History   Tobacco Use  Smoking Status Never Smoker  Smokeless Tobacco Never Used   Social History   Substance and Sexual Activity  Alcohol Use Not Currently    Family History:  Family History  Problem Relation Age of Onset  . Hyperlipidemia  Mother   . Heart disease Mother   . Lung cancer Mother   . Leukemia Father   . Arthritis Sister   . Thyroid disease Sister   . Colon cancer Maternal Grandfather   . Lupus Daughter   . Kidney cancer Neg Hx   . Breast cancer Neg Hx     Past medical history, surgical history, medications, allergies, family history and social history reviewed with patient today and changes made to appropriate areas of the chart.   Review of Systems - negative All other ROS negative except what is listed above and in the HPI.      Objective:    BP 118/72   Pulse 66   Temp (!) 97.4 F (36.3 C) (Oral)   Ht 5' 6.6" (1.692 m)   Wt 167 lb 9.6 oz (76 kg)   SpO2 99%   BMI 26.57 kg/m   Wt Readings from Last 3 Encounters:  02/29/20 167 lb 9.6 oz (76 kg)  11/05/19 165 lb (74.8 kg)  10/06/19 164 lb (74.4 kg)    Physical Exam Constitutional:      General: She is awake. She is not in acute distress.    Appearance: She is well-developed. She is not ill-appearing.  HENT:     Head: Normocephalic and atraumatic.     Right Ear: Hearing, tympanic membrane, ear canal and external ear normal. No drainage.     Left Ear: Hearing, tympanic membrane, ear canal and external ear normal. No drainage.     Nose: Nose normal.     Right Sinus: No maxillary sinus tenderness or frontal sinus tenderness.     Left Sinus: No maxillary sinus tenderness or frontal sinus tenderness.     Mouth/Throat:     Mouth: Mucous membranes are moist.     Pharynx: Oropharynx is clear. Uvula midline. No pharyngeal swelling, oropharyngeal exudate or posterior oropharyngeal erythema.  Eyes:     General: Lids are normal.        Right eye: No discharge.        Left eye: No discharge.     Extraocular Movements: Extraocular movements intact.     Conjunctiva/sclera: Conjunctivae normal.     Pupils: Pupils are equal, round, and reactive to light.     Visual Fields: Right  eye visual fields normal and left eye visual fields normal.  Neck:      Thyroid: No thyromegaly.     Vascular: No carotid bruit.     Trachea: Trachea normal.  Cardiovascular:     Rate and Rhythm: Normal rate and regular rhythm.     Heart sounds: Normal heart sounds. No murmur heard.  No gallop.   Pulmonary:     Effort: Pulmonary effort is normal. No accessory muscle usage or respiratory distress.     Breath sounds: Normal breath sounds.  Chest:     Breasts:        Right: Normal.        Left: Normal.  Abdominal:     General: Bowel sounds are normal.     Palpations: Abdomen is soft. There is no hepatomegaly or splenomegaly.     Tenderness: There is no abdominal tenderness.  Musculoskeletal:        General: Normal range of motion.     Cervical back: Normal range of motion and neck supple.     Right lower leg: No edema.     Left lower leg: No edema.  Lymphadenopathy:     Head:     Right side of head: No submental, submandibular, tonsillar, preauricular or posterior auricular adenopathy.     Left side of head: No submental, submandibular, tonsillar, preauricular or posterior auricular adenopathy.     Cervical: No cervical adenopathy.     Upper Body:     Right upper body: No supraclavicular, axillary or pectoral adenopathy.     Left upper body: No supraclavicular, axillary or pectoral adenopathy.  Skin:    General: Skin is warm and dry.     Capillary Refill: Capillary refill takes less than 2 seconds.     Findings: No rash.  Neurological:     Mental Status: She is alert and oriented to person, place, and time.     Cranial Nerves: Cranial nerves are intact.     Gait: Gait is intact.     Deep Tendon Reflexes: Reflexes are normal and symmetric.     Reflex Scores:      Brachioradialis reflexes are 2+ on the right side and 2+ on the left side.      Patellar reflexes are 2+ on the right side and 2+ on the left side. Psychiatric:        Attention and Perception: Attention normal.        Mood and Affect: Mood normal.        Speech: Speech normal.         Behavior: Behavior normal. Behavior is cooperative.        Thought Content: Thought content normal.        Judgment: Judgment normal.     Results for orders placed or performed in visit on 01/01/20  SAR CoV2 Serology (COVID 19)AB(IGG)IA  Result Value Ref Range   DiaSorin SARS-CoV-2 Ab, IgG Positive Negative      Assessment & Plan:   Problem List Items Addressed This Visit      Cardiovascular and Mediastinum   Intractable migraine with aura without status migrainosus    Chronic, stable at this time with improvement with addition of Ajovy and Nurtec.  Continue current medication regimen as prescribed by neurology and collaboration with neurology.  She has history of pancreatitis with medications used for migraines, will need to be cautious with treatment.  Return in 6 months.       Relevant Medications  AJOVY 225 MG/1.5ML SOAJ   Other Relevant Orders   Comprehensive metabolic panel     Digestive   GERD without esophagitis    Chronic, ongoing.  Continue current medication regimen and adjust dose as needed. Mag level today.      Relevant Orders   Magnesium     Endocrine   Acquired hypothyroidism    Chronic, stable with TSH within normal range.  Continue current regimen and adjust as needed.  Plan on checking thyroid levels annually or sooner if symptomatic.   Return in 6 months.  Check CBC today.      Relevant Orders   TSH     Musculoskeletal and Integument   Osteoporosis    Chronic, last DEXA June 2019.  Continue Vitamin D, may need to utilize higher weekly doses in future as level 29.1 on recent labs.  Recheck level today and DEXA order placed.  Consider bisphosphonate if ongoing osteoporosis -- if poorly tolerated consider injectable.        Relevant Orders   VITAMIN D 25 Hydroxy (Vit-D Deficiency, Fractures)   DG Bone Density     Other   Overweight (BMI 25.0-29.9)    Recommended eating smaller high protein, low fat meals more frequently and exercising 30 mins a  day 5 times a week with a goal of 10-15lb weight loss in the next 3 months. Patient voiced their understanding and motivation to adhere to these recommendations.       Elevated LDL cholesterol level    Ongoing, with ASCVD 3.4%.  Continue focus on diet regimen, discussed at length with her.  She may trial red yeast rice.  Poor tolerance to fish oil due to GERD.   Return in 6 months.  Check lipid panel today.      Relevant Orders   Lipid Panel w/o Chol/HDL Ratio    Other Visit Diagnoses    Physical exam, annual    -  Primary   Annual labs to include CBC, CMP, TSH, lipid   Bruises easily       Check CBC today, has had full hematology work-up in past with Hashimoto's noted.   Relevant Orders   CBC with Differential/Platelet   Encounter for screening mammogram for malignant neoplasm of breast       Mammogram ordered   Relevant Orders   MM DIGITAL SCREENING BILATERAL       Follow up plan: Return in about 6 months (around 08/30/2020) for Thyroid, HLD, Migraines, Osteoporosis.   LABORATORY TESTING:  - Pap smear: not applicable  IMMUNIZATIONS:   - Tdap: Tetanus vaccination status reviewed: last tetanus booster within 10 years. - Influenza: Up to date - Pneumovax: Not applicable - Prevnar: Not applicable - HPV: Not applicable - Zostavax vaccine: Up to date --- 2018  SCREENING: -Mammogram: Up to date  - Colonoscopy: Up to date  - Bone Density: Ordered today -- last in 2019 June -- osteoporosis -Hearing Test: Not applicable  -Spirometry: Not applicable   PATIENT COUNSELING:   Advised to take 1 mg of folate supplement per day if capable of pregnancy.   Sexuality: Discussed sexually transmitted diseases, partner selection, use of condoms, avoidance of unintended pregnancy  and contraceptive alternatives.   Advised to avoid cigarette smoking.  I discussed with the patient that most people either abstain from alcohol or drink within safe limits (<=14/week and <=4 drinks/occasion  for males, <=7/weeks and <= 3 drinks/occasion for females) and that the risk for alcohol disorders and other health effects  rises proportionally with the number of drinks per week and how often a drinker exceeds daily limits.  Discussed cessation/primary prevention of drug use and availability of treatment for abuse.   Diet: Encouraged to adjust caloric intake to maintain  or achieve ideal body weight, to reduce intake of dietary saturated fat and total fat, to limit sodium intake by avoiding high sodium foods and not adding table salt, and to maintain adequate dietary potassium and calcium preferably from fresh fruits, vegetables, and low-fat dairy products.    stressed the importance of regular exercise  Injury prevention: Discussed safety belts, safety helmets, smoke detector, smoking near bedding or upholstery.   Dental health: Discussed importance of regular tooth brushing, flossing, and dental visits.    NEXT PREVENTATIVE PHYSICAL DUE IN 1 YEAR. Return in about 6 months (around 08/30/2020) for Thyroid, HLD, Migraines, Osteoporosis.

## 2020-02-29 NOTE — Assessment & Plan Note (Signed)
Chronic, stable at this time with improvement with addition of Ajovy and Nurtec.  Continue current medication regimen as prescribed by neurology and collaboration with neurology.  She has history of pancreatitis with medications used for migraines, will need to be cautious with treatment.  Return in 6 months.

## 2020-03-01 LAB — COMPREHENSIVE METABOLIC PANEL
ALT: 24 IU/L (ref 0–32)
AST: 31 IU/L (ref 0–40)
Albumin/Globulin Ratio: 1.7 (ref 1.2–2.2)
Albumin: 4.1 g/dL (ref 3.8–4.8)
Alkaline Phosphatase: 101 IU/L (ref 48–121)
BUN/Creatinine Ratio: 14 (ref 12–28)
BUN: 13 mg/dL (ref 8–27)
Bilirubin Total: 0.3 mg/dL (ref 0.0–1.2)
CO2: 25 mmol/L (ref 20–29)
Calcium: 9.2 mg/dL (ref 8.7–10.3)
Chloride: 101 mmol/L (ref 96–106)
Creatinine, Ser: 0.9 mg/dL (ref 0.57–1.00)
GFR calc Af Amer: 79 mL/min/{1.73_m2} (ref >59)
GFR calc non Af Amer: 68 mL/min/{1.73_m2} (ref >59)
Globulin, Total: 2.4 g/dL (ref 1.5–4.5)
Glucose: 79 mg/dL (ref 65–99)
Potassium: 4 mmol/L (ref 3.5–5.2)
Sodium: 138 mmol/L (ref 134–144)
Total Protein: 6.5 g/dL (ref 6.0–8.5)

## 2020-03-01 LAB — CBC WITH DIFFERENTIAL/PLATELET
Basophils Absolute: 0.1 10*3/uL (ref 0.0–0.2)
Basos: 1 %
EOS (ABSOLUTE): 0 10*3/uL (ref 0.0–0.4)
Eos: 0 %
Hematocrit: 40 % (ref 34.0–46.6)
Hemoglobin: 13 g/dL (ref 11.1–15.9)
Immature Grans (Abs): 0 10*3/uL (ref 0.0–0.1)
Immature Granulocytes: 0 %
Lymphocytes Absolute: 1.6 10*3/uL (ref 0.7–3.1)
Lymphs: 33 %
MCH: 29.2 pg (ref 26.6–33.0)
MCHC: 32.5 g/dL (ref 31.5–35.7)
MCV: 90 fL (ref 79–97)
Monocytes Absolute: 0.5 10*3/uL (ref 0.1–0.9)
Monocytes: 11 %
Neutrophils Absolute: 2.7 10*3/uL (ref 1.4–7.0)
Neutrophils: 55 %
Platelets: 257 10*3/uL (ref 150–450)
RBC: 4.45 x10E6/uL (ref 3.77–5.28)
RDW: 12.3 % (ref 11.7–15.4)
WBC: 4.9 10*3/uL (ref 3.4–10.8)

## 2020-03-01 LAB — VITAMIN D 25 HYDROXY (VIT D DEFICIENCY, FRACTURES): Vit D, 25-Hydroxy: 32.3 ng/mL (ref 30.0–100.0)

## 2020-03-01 LAB — LIPID PANEL W/O CHOL/HDL RATIO
Cholesterol, Total: 208 mg/dL — ABNORMAL HIGH (ref 100–199)
HDL: 79 mg/dL (ref >39)
LDL Chol Calc (NIH): 112 mg/dL — ABNORMAL HIGH (ref 0–99)
Triglycerides: 98 mg/dL (ref 0–149)
VLDL Cholesterol Cal: 17 mg/dL (ref 5–40)

## 2020-03-01 LAB — TSH: TSH: 1.48 u[IU]/mL (ref 0.450–4.500)

## 2020-03-01 LAB — MAGNESIUM: Magnesium: 2.1 mg/dL (ref 1.6–2.3)

## 2020-03-01 NOTE — Progress Notes (Signed)
Contacted via [place] morning Cindy Jefferson.  Labs have returned and overall they look great!!  You can continue current Levothyroxine dosing.  Kidney and liver function are normal and CBC shows no anemia or abnormalities.  Vitamin D level has improved.  Cholesterol levels are still elevated, but some improvement from previous.  You can continue diet focus based on your risk score I will place below.  Any questions?  Have a great day!! The 10-year ASCVD risk score Cindy Bussing DC Brooke Bonito., et al., 2013) is: 3.3%   Values used to calculate the score:     Age: 64 years     Sex: Female     Is Non-Hispanic African American: No     Diabetic: No     Tobacco smoker: No     Systolic Blood Pressure: 438 mmHg     Is BP treated: No     HDL Cholesterol: 79 mg/dL     Total Cholesterol: 208 mg/dL

## 2020-03-02 DIAGNOSIS — M5090 Cervical disc disorder, unspecified, unspecified cervical region: Secondary | ICD-10-CM | POA: Diagnosis not present

## 2020-03-02 DIAGNOSIS — M5481 Occipital neuralgia: Secondary | ICD-10-CM | POA: Diagnosis not present

## 2020-03-02 DIAGNOSIS — R292 Abnormal reflex: Secondary | ICD-10-CM | POA: Diagnosis not present

## 2020-03-02 DIAGNOSIS — G43119 Migraine with aura, intractable, without status migrainosus: Secondary | ICD-10-CM | POA: Diagnosis not present

## 2020-03-21 ENCOUNTER — Ambulatory Visit: Payer: BC Managed Care – PPO | Admitting: Urology

## 2020-03-29 ENCOUNTER — Other Ambulatory Visit: Payer: Self-pay | Admitting: Nurse Practitioner

## 2020-03-29 DIAGNOSIS — Z1231 Encounter for screening mammogram for malignant neoplasm of breast: Secondary | ICD-10-CM

## 2020-03-30 ENCOUNTER — Telehealth: Payer: Self-pay | Admitting: Nurse Practitioner

## 2020-03-30 NOTE — Telephone Encounter (Signed)
Can an order be entered and then be faxed?

## 2020-03-30 NOTE — Telephone Encounter (Signed)
[facility] calling to request order for patient to have bone density scan per patient request.    Fax#  [phone]

## 2020-03-31 NOTE — Telephone Encounter (Signed)
Dr. Wynetta Emery signed the order. Faxed

## 2020-03-31 NOTE — Telephone Encounter (Signed)
There is already an order in. OK to fax

## 2020-03-31 NOTE — Telephone Encounter (Signed)
Order printed and placed in folder for signature.

## 2020-04-12 DIAGNOSIS — B9689 Other specified bacterial agents as the cause of diseases classified elsewhere: Secondary | ICD-10-CM | POA: Diagnosis not present

## 2020-04-12 DIAGNOSIS — J209 Acute bronchitis, unspecified: Secondary | ICD-10-CM | POA: Diagnosis not present

## 2020-04-12 DIAGNOSIS — J019 Acute sinusitis, unspecified: Secondary | ICD-10-CM | POA: Diagnosis not present

## 2020-04-12 DIAGNOSIS — Z03818 Encounter for observation for suspected exposure to other biological agents ruled out: Secondary | ICD-10-CM | POA: Diagnosis not present

## 2020-04-14 DIAGNOSIS — M8588 Other specified disorders of bone density and structure, other site: Secondary | ICD-10-CM | POA: Diagnosis not present

## 2020-04-14 LAB — HM DEXA SCAN

## 2020-05-06 ENCOUNTER — Other Ambulatory Visit: Payer: Self-pay

## 2020-05-06 ENCOUNTER — Ambulatory Visit
Admission: RE | Admit: 2020-05-06 | Discharge: 2020-05-06 | Disposition: A | Discharge status: Home / Self Care | Payer: BC Managed Care – PPO | Source: Ambulatory Visit | Attending: Nurse Practitioner | Admitting: Nurse Practitioner

## 2020-05-06 DIAGNOSIS — Z1231 Encounter for screening mammogram for malignant neoplasm of breast: Secondary | ICD-10-CM | POA: Diagnosis not present

## 2020-05-31 DIAGNOSIS — M546 Pain in thoracic spine: Secondary | ICD-10-CM | POA: Diagnosis not present

## 2020-06-06 ENCOUNTER — Ambulatory Visit: Payer: BC Managed Care – PPO | Admitting: Urology

## 2020-06-28 DIAGNOSIS — S8001XA Contusion of right knee, initial encounter: Secondary | ICD-10-CM | POA: Diagnosis not present

## 2020-09-03 ENCOUNTER — Encounter: Payer: Self-pay | Admitting: Nurse Practitioner

## 2020-09-05 ENCOUNTER — Other Ambulatory Visit: Payer: Self-pay | Admitting: Nurse Practitioner

## 2020-09-05 ENCOUNTER — Ambulatory Visit: Payer: BC Managed Care – PPO | Admitting: Nurse Practitioner

## 2020-09-05 ENCOUNTER — Other Ambulatory Visit: Payer: BC Managed Care – PPO

## 2020-09-05 DIAGNOSIS — J029 Acute pharyngitis, unspecified: Secondary | ICD-10-CM

## 2020-09-06 ENCOUNTER — Telehealth: Payer: BC Managed Care – PPO | Admitting: Family Medicine

## 2020-09-07 ENCOUNTER — Telehealth (INDEPENDENT_AMBULATORY_CARE_PROVIDER_SITE_OTHER): Payer: BC Managed Care – PPO | Admitting: Family Medicine

## 2020-09-07 ENCOUNTER — Encounter: Payer: Self-pay | Admitting: Family Medicine

## 2020-09-07 DIAGNOSIS — J398 Other specified diseases of upper respiratory tract: Secondary | ICD-10-CM | POA: Diagnosis not present

## 2020-09-07 LAB — SARS-COV-2, NAA 2 DAY TAT

## 2020-09-07 LAB — NOVEL CORONAVIRUS, NAA: SARS-CoV-2, NAA: NOT DETECTED

## 2020-09-07 NOTE — Progress Notes (Signed)
Virtual Visit via Video Note  I connected with Cindy Jefferson on 09/07/20 at  1:00 PM EST by a video enabled telemedicine application and verified that I am speaking with the correct person using two identifiers.  Location: Patient: home Provider: [facility]   I discussed the limitations of evaluation and management by telemedicine and the availability of in person appointments. The patient expressed understanding and agreed to proceed.  History of Present Illness:  UPPER RESPIRATORY TRACT INFECTION - COVID negative 09/05/20 - sx started 12/26. Worst symptom: sore throat, weak and tired Fever: no Cough: no Shortness of breath: no Wheezing: no Chest pain: no Chest tightness: no Chest congestion: no Nasal congestion: occasionally Sore throat: yes Sinus pressure: previously, now resolved Headache: no Ear pain: no  Ear pressure: previously, now resolved Vomiting: no Fatigue: yes Sick contacts: no Context: better Recurrent sinusitis: no Relief with OTC cold/cough medications: hasn't tried  Treatments attempted: nasonex  Hasn't had to use albuterol.   Observations/Objective:  Well appearing, in NAD. Speaks in full sentences in no resp distress.   Assessment and Plan:  Congestion of upper respiratory tract Viral vs allergic. COVID negative. Fairly mild sx.  Recommended OTC symptom relief with antihistamine, decongestant. Return and emergency precautions reviewed.     I discussed the assessment and treatment plan with the patient. The patient was provided an opportunity to ask questions and all were answered. The patient agreed with the plan and demonstrated an understanding of the instructions.   The patient was advised to call back or seek an in-person evaluation if the symptoms worsen or if the condition fails to improve as anticipated.  I provided 6 minutes of non-face-to-face time during this encounter.   Caro Laroche, DO

## 2020-09-07 NOTE — Assessment & Plan Note (Signed)
Viral vs allergic. COVID negative. Fairly mild sx.  Recommended OTC symptom relief with antihistamine, decongestant. Return and emergency precautions reviewed.

## 2020-09-07 NOTE — Progress Notes (Signed)
Contacted via MyChart   Good morning Cindy Jefferson, your Covid testing returned negative.  Very good news.  Continue to hydrate and rest, if worsening symptoms please schedule visit.

## 2020-09-07 NOTE — Patient Instructions (Signed)
It was great to see you! I'm glad you don't have COVID!  Our plans for today:  - Try an over the counter antihistamine like zyrtec, claritin, or allegra for your symptoms. A decongestant like sudafed can help with stuffy nose and congestion.  - If your symptoms don't get better in about a week or so or become particularly bothersome, let us know.  - Certainly if you develop difficulties breathing, you should go to the Emergency Department.   Take care and seek immediate care sooner if you develop any concerns.   Dr. Linwood Dibbles

## 2020-10-06 DIAGNOSIS — M79672 Pain in left foot: Secondary | ICD-10-CM | POA: Diagnosis not present

## 2020-10-18 ENCOUNTER — Ambulatory Visit: Payer: BC Managed Care – PPO | Admitting: Nurse Practitioner

## 2020-10-21 DIAGNOSIS — Z20822 Contact with and (suspected) exposure to covid-19: Secondary | ICD-10-CM | POA: Diagnosis not present

## 2020-10-21 DIAGNOSIS — Z03818 Encounter for observation for suspected exposure to other biological agents ruled out: Secondary | ICD-10-CM | POA: Diagnosis not present

## 2020-10-31 ENCOUNTER — Telehealth (INDEPENDENT_AMBULATORY_CARE_PROVIDER_SITE_OTHER): Payer: BC Managed Care – PPO | Admitting: Nurse Practitioner

## 2020-10-31 ENCOUNTER — Other Ambulatory Visit: Payer: Self-pay | Admitting: Nurse Practitioner

## 2020-10-31 ENCOUNTER — Encounter: Payer: Self-pay | Admitting: Nurse Practitioner

## 2020-10-31 VITALS — BP 117/76 | HR 70

## 2020-10-31 DIAGNOSIS — M85851 Other specified disorders of bone density and structure, right thigh: Secondary | ICD-10-CM

## 2020-10-31 DIAGNOSIS — E039 Hypothyroidism, unspecified: Secondary | ICD-10-CM

## 2020-10-31 DIAGNOSIS — E7800 Pure hypercholesterolemia, unspecified: Secondary | ICD-10-CM | POA: Diagnosis not present

## 2020-10-31 MED ORDER — NITROFURANTOIN MONOHYD MACRO 100 MG PO CAPS: 100.0000 mg | ORAL_CAPSULE | ORAL | 1 refills | Status: DC | PRN

## 2020-10-31 NOTE — Assessment & Plan Note (Signed)
Ongoing, with ASCVD 3.6%.  Continue focus on diet regimen, discussed at length with her.  She may trial red yeast rice.  Poor tolerance to fish oil due to GERD.   Return in 6 months.  Check lipid panel next visit.

## 2020-10-31 NOTE — Assessment & Plan Note (Signed)
Chronic, last DEXA August 2021.  Continue Vitamin D, may need to utilize higher weekly doses in future.  Recheck level at physical.  Consider bisphosphonate if osteoporosis -- if poorly tolerated consider injectable.

## 2020-10-31 NOTE — Progress Notes (Signed)
BP 117/76   Pulse 70   SpO2 99%    Subjective:    Patient ID: Cindy Jefferson, female    DOB: [birth year], 65 y.o.   MRN: [id number]  HPI: Cindy Jefferson is a 65 y.o. female  Chief Complaint  Patient presents with  . Thyroid   . Osteoporosis    Patient states she recently had a bone density scan and would like to discuss results and medication.   . This visit was completed via MyChart due to the restrictions of the COVID-19 pandemic. All issues as above were discussed and addressed. Physical exam was done as above through visual confirmation on MyChart. If it was felt that the patient should be evaluated in the office, they were directed there. The patient verbally consented to this visit. . Location of the patient: home . Location of the provider: work . Those involved with this call:  . Provider: Marnee Guarneri, DNP . CMA: Irena Reichmann, CMA . Front Desk/Registration: Roe Rutherford  . Time spent on call: 21 minutes with patient face to face via video conference. More than 50% of this time was spent in counseling and coordination of care. 15 minutes total spent in review of patient's record and preparation of their chart.  . I verified patient identity using two factors (patient name and date of birth). Patient consents verbally to being seen via telemedicine visit today.   HYPOTHYROIDISM Recent TSH 1.480 and continues on Levothyroxine 75 MCG. Thyroid control status:stable Satisfied with current treatment? yes Medication side effects: no Medication compliance: good compliance Etiology of hypothyroidism:  Recent dose adjustment:no Fatigue: no Cold intolerance: no Heat intolerance: occasionally at night Weight gain: no Weight loss: no Constipation: no Diarrhea/loose stools: no Palpitations: no Lower extremity edema: no Anxiety/depressed mood: no   OSTEOPENIA Repeat scan 04/14/20 -- noting osteopenia T score -1.6 with no significant change. Satisfied with current  treatment?: yes Medication side effects: no Medication compliance: good compliance Past osteoporosis medications/treatments: calcium and Vit D Adequate calcium & vitamin D: yes Weight bearing exercises: yes   HYPERLIPIDEMIA No current medications.  Diet focused. Hyperlipidemia status: good compliance Supplements: none Aspirin:  no The 10-year ASCVD risk score Mikey Bussing DC Jr., et al., 2013) is: 3.6%   Values used to calculate the score:     Age: 63 years     Sex: Female     Is Non-Hispanic African American: No     Diabetic: No     Tobacco smoker: No     Systolic Blood Pressure: 641 mmHg     Is BP treated: No     HDL Cholesterol: 79 mg/dL     Total Cholesterol: 208 mg/dL Chest pain:  no Coronary artery disease:  no Family history CAD:  yes Family history early CAD:  no   Relevant past medical, surgical, family and social history reviewed and updated as indicated. Interim medical history since our last visit reviewed. Allergies and medications reviewed and updated.  Review of Systems  Constitutional: Negative for activity change, appetite change, diaphoresis, fatigue and fever.  Respiratory: Negative for cough, chest tightness and shortness of breath.   Cardiovascular: Negative for chest pain, palpitations and leg swelling.  Gastrointestinal: Negative.   Endocrine: Negative for cold intolerance and heat intolerance.  Neurological: Negative.   Psychiatric/Behavioral: Negative.     Per HPI unless specifically indicated above     Objective:    BP 117/76   Pulse 70   SpO2 99%   Wt Readings  from Last 3 Encounters:  02/29/20 167 lb 9.6 oz (76 kg)  11/05/19 165 lb (74.8 kg)  10/06/19 164 lb (74.4 kg)    Physical Exam Vitals and nursing note reviewed.  Constitutional:      General: She is awake. She is not in acute distress.    Appearance: She is well-developed. She is not ill-appearing.  HENT:     Head: Normocephalic.     Right Ear: Hearing normal.     Left Ear:  Hearing normal.  Eyes:     General: Lids are normal.        Right eye: No discharge.        Left eye: No discharge.     Conjunctiva/sclera: Conjunctivae normal.  Pulmonary:     Effort: Pulmonary effort is normal. No accessory muscle usage or respiratory distress.  Musculoskeletal:     Cervical back: Normal range of motion.  Neurological:     Mental Status: She is alert and oriented to person, place, and time.  Psychiatric:        Attention and Perception: Attention normal.        Mood and Affect: Mood normal.        Behavior: Behavior normal. Behavior is cooperative.        Thought Content: Thought content normal.        Judgment: Judgment normal.     Results for orders placed or performed in visit on 09/05/20  Novel Coronavirus, NAA ([facility])   Specimen: Nasopharyngeal(NP) swabs in vial transport medium  Result Value Ref Range   SARS-CoV-2, NAA Not Detected Not Detected  SARS-COV-2, NAA 2 DAY TAT  Result Value Ref Range   SARS-CoV-2, NAA 2 DAY TAT Performed       Assessment & Plan:   Problem List Items Addressed This Visit      Endocrine   Acquired hypothyroidism    Chronic, stable with TSH within normal range.  Continue current regimen and adjust as needed.  Plan on checking thyroid levels annually or sooner if symptomatic.   Return in 6 months.         Musculoskeletal and Integument   Osteopenia - Primary    Chronic, last DEXA August 2021.  Continue Vitamin D, may need to utilize higher weekly doses in future.  Recheck level at physical.  Consider bisphosphonate if osteoporosis -- if poorly tolerated consider injectable.          Other   Elevated LDL cholesterol level    Ongoing, with ASCVD 3.6%.  Continue focus on diet regimen, discussed at length with her.  She may trial red yeast rice.  Poor tolerance to fish oil due to GERD.   Return in 6 months.  Check lipid panel next visit.         I discussed the assessment and treatment plan with the patient. The  patient was provided an opportunity to ask questions and all were answered. The patient agreed with the plan and demonstrated an understanding of the instructions.   The patient was advised to call back or seek an in-person evaluation if the symptoms worsen or if the condition fails to improve as anticipated.   I provided 21 minutes of time during this encounter.  Follow up plan: Return in about 4 months (around 03/01/2021) for Annual physical due after 02/28/21.

## 2020-10-31 NOTE — Assessment & Plan Note (Signed)
Chronic, stable with TSH within normal range.  Continue current regimen and adjust as needed.  Plan on checking thyroid levels annually or sooner if symptomatic.   Return in 6 months.

## 2020-10-31 NOTE — Patient Instructions (Signed)
Osteopenia  Osteopenia is a loss of thickness (density) inside the bones. Another name for osteopenia is low bone mass. Mild osteopenia is a normal part of aging. It is not a disease, and it does not cause symptoms. However, if you have osteopenia and continue to lose bone mass, you could develop a condition that causes the bones to become thin and break more easily (osteoporosis). Osteoporosis can cause you to lose some height, have back pain, and have a stooped posture. Although osteopenia is not a disease, making changes to your lifestyle and diet can help to prevent osteopenia from developing into osteoporosis. What are the causes? Osteopenia is caused by loss of calcium in the bones. Bones are constantly changing. Old bone cells are continually being replaced with new bone cells. This process builds new bone. The mineral calcium is needed to build new bone and maintain bone density. Bone density is usually highest around age 35. After that, most people's bodies cannot replace all the bone they have lost with new bone. What increases the risk? You are more likely to develop this condition if:  You are older than age 50.  You are a woman who went through menopause early.  You have a long illness that keeps you in bed.  You do not get enough exercise.  You lack certain nutrients (malnutrition).  You have an overactive thyroid gland (hyperthyroidism).  You use products that contain nicotine or tobacco, such as cigarettes, e-cigarettes and chewing tobacco, or you drink a lot of alcohol.  You are taking medicines that weaken the bones, such as steroids. What are the signs or symptoms? This condition does not cause any symptoms. You may have a slightly higher risk for bone breaks (fractures), so getting fractures more easily than normal may be an indication of osteopenia. How is this diagnosed? This condition may be diagnosed based on an X-ray exam that measures bone density (dual-energy  X-ray absorptiometry, or DEXA). This test can measure bone density in your hips, spine, and wrists. Osteopenia has no symptoms, so this condition is usually diagnosed after a routine bone density screening test is done for osteoporosis. This routine screening is usually done for:  Women who are age 65 or older.  Men who are age 70 or older. If you have risk factors for osteopenia, you may have the screening test at an earlier age. How is this treated? Making dietary and lifestyle changes can lower your risk for osteoporosis. If you have severe osteopenia that is close to becoming osteoporosis, this condition can be treated with medicines and dietary supplements such as calcium and vitamin D. These supplements help to rebuild bone density. Follow these instructions at home: Eating and drinking Eat a diet that is high in calcium and vitamin D.  Calcium is found in dairy products, beans, salmon, and leafy green vegetables like spinach and broccoli.  Look for foods that have vitamin D and calcium added to them (fortified foods), such as orange juice, cereal, and bread.   Lifestyle  Do 30 minutes or more of a weight-bearing exercise every day, such as walking, jogging, or playing a sport. These types of exercises strengthen the bones.  Do not use any products that contain nicotine or tobacco, such as cigarettes, e-cigarettes, and chewing tobacco. If you need help quitting, ask your health care provider.  Do not drink alcohol if: ? Your health care provider tells you not to drink. ? You are pregnant, may be pregnant, or are planning to become   pregnant.  If you drink alcohol: ? Limit how much you use to:  0-1 drink a day for women.  0-2 drinks a day for men. ? Be aware of how much alcohol is in your drink. In the [place] one drink equals one 12 oz bottle of beer (355 mL), one 5 oz glass of wine (148 mL), or one 1 oz glass of hard liquor (44 mL). General instructions  Take over-the-counter  and prescription medicines only as told by your health care provider. These include vitamins and supplements.  Take precautions at home to lower your risk of falling, such as: ? Keeping rooms well-lit and free of clutter, such as cords. ? Installing safety rails on stairs. ? Using rubber mats in the bathroom or other areas that are often wet or slippery.  Keep all follow-up visits. This is important. Contact a health care provider if:  You have not had a bone density screening for osteoporosis and you are: ? A woman who is age 65 or older. ? A man who is age 70 or older.  You are a postmenopausal woman who has not had a bone density screening for osteoporosis.  You are older than age 50 and you want to know if you should have bone density screening for osteoporosis. Summary  Osteopenia is a loss of thickness (density) inside the bones. Another name for osteopenia is low bone mass.  Osteopenia is not a disease, but it may increase your risk for a condition that causes the bones to become thin and break more easily (osteoporosis).  You may be at risk for osteopenia if you are older than age 50 or if you are a woman who went through early menopause.  Osteopenia does not cause any symptoms, but it can be diagnosed with a bone density screening test.  Dietary and lifestyle changes are the first treatment for osteopenia. These may lower your risk for osteoporosis. This information is not intended to replace advice given to you by your health care provider. Make sure you discuss any questions you have with your health care provider. Document Revised: 02/04/2020 Document Reviewed: 02/04/2020 Elsevier Patient Education  2021 Elsevier Inc.  

## 2020-11-04 DIAGNOSIS — Z20822 Contact with and (suspected) exposure to covid-19: Secondary | ICD-10-CM | POA: Diagnosis not present

## 2020-11-04 DIAGNOSIS — Z03818 Encounter for observation for suspected exposure to other biological agents ruled out: Secondary | ICD-10-CM | POA: Diagnosis not present

## 2020-11-15 DIAGNOSIS — M722 Plantar fascial fibromatosis: Secondary | ICD-10-CM | POA: Diagnosis not present

## 2020-11-15 DIAGNOSIS — M79671 Pain in right foot: Secondary | ICD-10-CM | POA: Diagnosis not present

## 2020-11-15 DIAGNOSIS — M79672 Pain in left foot: Secondary | ICD-10-CM | POA: Diagnosis not present

## 2020-11-30 ENCOUNTER — Other Ambulatory Visit: Payer: Self-pay | Admitting: Nurse Practitioner

## 2020-11-30 NOTE — Telephone Encounter (Signed)
Requested Prescriptions  Pending Prescriptions Disp Refills  . levothyroxine (SYNTHROID) 75 MCG tablet [Pharmacy Med Name: LEVOTHYROXINE 75 MCG TABLET] 90 tablet 3    Sig: TAKE 1 TABLET BY MOUTH EVERY DAY     Endocrinology:  Hypothyroid Agents Failed - 11/30/2020  2:26 AM      Failed - TSH needs to be rechecked within 3 months after an abnormal result. Refill until TSH is due.      Passed - TSH in normal range and within 360 days    TSH  Date Value Ref Range Status  02/29/2020 1.480 0.450 - 4.500 uIU/mL Final         Passed - Valid encounter within last 12 months    Recent Outpatient Visits          1 month ago Osteopenia of neck of right femur   [employer] [place], [place] T, NP   2 months ago Congestion of upper respiratory tract   [facility], DO   9 months ago Physical exam, annual   [place], [place] T, NP   1 year ago Lab test positive for detection of COVID-19 virus   [facility] [place], Henrine Screws T, NP   1 year ago COVID-19 virus infection   [place], [place], [place]      Future Appointments            In 3 months Cann[place], Barbaraann Faster, NP [employer] [employer]

## 2020-12-11 DIAGNOSIS — Z20822 Contact with and (suspected) exposure to covid-19: Secondary | ICD-10-CM | POA: Diagnosis not present

## 2020-12-11 DIAGNOSIS — J069 Acute upper respiratory infection, unspecified: Secondary | ICD-10-CM | POA: Diagnosis not present

## 2020-12-20 ENCOUNTER — Other Ambulatory Visit: Payer: Self-pay | Admitting: Nurse Practitioner

## 2020-12-27 ENCOUNTER — Other Ambulatory Visit: Payer: Self-pay

## 2020-12-27 ENCOUNTER — Ambulatory Visit: Payer: BC Managed Care – PPO | Admitting: Nurse Practitioner

## 2020-12-27 ENCOUNTER — Encounter: Payer: Self-pay | Admitting: Nurse Practitioner

## 2020-12-27 VITALS — BP 114/74 | HR 62 | Temp 97.8°F | Wt 171.4 lb

## 2020-12-27 DIAGNOSIS — R399 Unspecified symptoms and signs involving the genitourinary system: Secondary | ICD-10-CM | POA: Diagnosis not present

## 2020-12-27 DIAGNOSIS — D1801 Hemangioma of skin and subcutaneous tissue: Secondary | ICD-10-CM

## 2020-12-27 LAB — WET PREP FOR TRICH, YEAST, CLUE
Clue Cell Exam: NEGATIVE
Trichomonas Exam: NEGATIVE
Yeast Exam: NEGATIVE

## 2020-12-27 LAB — URINALYSIS, ROUTINE W REFLEX MICROSCOPIC
Bilirubin, UA: NEGATIVE
Glucose, UA: NEGATIVE
Ketones, UA: NEGATIVE
Leukocytes,UA: NEGATIVE
Nitrite, UA: NEGATIVE
Protein,UA: NEGATIVE
RBC, UA: NEGATIVE
Specific Gravity, UA: <1.005 — ABNORMAL LOW (ref 1.005–1.030)
Urobilinogen, Ur: 0.2 mg/dL (ref 0.2–1.0)
pH, UA: 6 (ref 5.0–7.5)

## 2020-12-27 NOTE — Progress Notes (Signed)
BP 114/74   Pulse 62   Temp 97.8 F (36.6 C) (Oral)   Wt 171 lb 6.4 oz (77.7 kg)   SpO2 99%   BMI 27.17 kg/m    Subjective:    Patient ID: Cindy Jefferson, female    DOB: 1955-09-19, 65 y.o.   MRN: [id number]  HPI: [name] is a 65 y.o. female  Chief Complaint  Patient presents with  . Red Bumps     Patient noticed some small cluster of red spots all over her body and she noticed they were not there last summer and her daughter is a Marine scientist and informed her to come have them checked out by her provider.  . Abdominal Pain    Patient states about 3 weeks ago, she notices when she has to urinates she notices ache and pain in her lower abdomen. Patient denies any burning with urination.   RED BUMPS Present to arms, stomach, and chest -- first noticed a few years ago with a few and then noticed more over time.  Is outside a lot, does wear sunscreen.  Has not gone for annual skin exam in a few years. Duration:  months  Location: generalized  Itching: no Burning: no Redness: no Oozing: no Scaling: no Blisters: no Painful: no Fevers: no Change in detergents/soaps/personal care products: no Recent illness: no Recent travel:no History of same: no Context: worse Alleviating factors: nothing Treatments attempted:nothing Shortness of breath: no  Throat/tongue swelling: no Myalgias/arthralgias: no   URINARY SYMPTOMS Uses Macrobid after intercourse at baseline, which helps avoid urine infection.  Used vaginal cream for awhile for this as well.  At this time does not have any further dryness issues to vagina, stopped using cream.  Then recently during period of not having intercourse noticed aching to lower abdomen and return dryness returned.  Did not restart using cream.   Dysuria: no Urinary frequency: no Urgency: no Small volume voids: no Symptom severity: no Urinary incontinence: no Foul odor: no Hematuria: no Abdominal pain: yes Back pain: no Suprapubic  pain/pressure: yes Flank pain: no Fever:  no Vomiting: no Status: stable Previous urinary tract infection: yes Recurrent urinary tract infection: no Sexual activity: monogomous History of sexually transmitted disease: no Treatments attempted: increasing fluids   Relevant past medical, surgical, family and social history reviewed and updated as indicated. Interim medical history since our last visit reviewed. Allergies and medications reviewed and updated.  Review of Systems  Constitutional: Negative for activity change, appetite change, diaphoresis, fatigue and fever.  Respiratory: Negative for cough, chest tightness and shortness of breath.   Cardiovascular: Negative for chest pain, palpitations and leg swelling.  Gastrointestinal: Positive for abdominal pain. Negative for abdominal distention, constipation, diarrhea, nausea and vomiting.  Endocrine: Negative for cold intolerance and heat intolerance.  Genitourinary: Negative.   Neurological: Negative.   Psychiatric/Behavioral: Negative.     Per HPI unless specifically indicated above     Objective:    BP 114/74   Pulse 62   Temp 97.8 F (36.6 C) (Oral)   Wt 171 lb 6.4 oz (77.7 kg)   SpO2 99%   BMI 27.17 kg/m   Wt Readings from Last 3 Encounters:  12/27/20 171 lb 6.4 oz (77.7 kg)  02/29/20 167 lb 9.6 oz (76 kg)  11/05/19 165 lb (74.8 kg)    Physical Exam Vitals and nursing note reviewed.  Constitutional:      General: She is awake. She is not in acute distress.  Appearance: She is well-developed and well-groomed. She is not ill-appearing.  HENT:     Head: Normocephalic.     Right Ear: Hearing normal.     Left Ear: Hearing normal.  Eyes:     General: Lids are normal.        Right eye: No discharge.        Left eye: No discharge.     Conjunctiva/sclera: Conjunctivae normal.     Pupils: Pupils are equal, round, and reactive to light.  Neck:     Thyroid: No thyromegaly.     Vascular: No carotid bruit.   Cardiovascular:     Rate and Rhythm: Normal rate and regular rhythm.     Heart sounds: Normal heart sounds. No murmur heard. No gallop.   Pulmonary:     Effort: Pulmonary effort is normal. No accessory muscle usage or respiratory distress.     Breath sounds: Normal breath sounds.  Abdominal:     General: Bowel sounds are normal. There is no distension.     Palpations: Abdomen is soft.     Tenderness: There is no abdominal tenderness.  Musculoskeletal:     Cervical back: Normal range of motion and neck supple.     Right lower leg: No edema.     Left lower leg: No edema.  Lymphadenopathy:     Cervical: No cervical adenopathy.  Skin:    General: Skin is warm and dry.     Comments: Scattered cherry angiomas to bilateral upper arms and abdominal area.  Tanned skin to face and upper extremities.    Neurological:     Mental Status: She is alert and oriented to person, place, and time.  Psychiatric:        Attention and Perception: Attention normal.        Mood and Affect: Mood normal.        Speech: Speech normal.        Behavior: Behavior normal. Behavior is cooperative.        Thought Content: Thought content normal.    Results for orders placed or performed in visit on 09/05/20  Novel Coronavirus, NAA ([facility])   Specimen: Nasopharyngeal(NP) swabs in vial transport medium  Result Value Ref Range   SARS-CoV-2, NAA Not Detected Not Detected  SARS-COV-2, NAA 2 DAY TAT  Result Value Ref Range   SARS-CoV-2, NAA 2 DAY TAT Performed       Assessment & Plan:   Problem List Items Addressed This Visit      Cardiovascular and Mediastinum   Cherry angioma - Primary    Noted with more scattered areas over past years, in sun often, using sun screen.  Will send to dermatology for continuation of yearly skin exams and assessment of angiomas.      Relevant Orders   Ambulatory referral to Dermatology    Other Visit Diagnoses    Urinary tract infection symptoms       UA negative and  wet prep negative today.  At this time continue prophylactic Macrobid post intercourse and restart vaginal cream as needed.  Return as needed.   Relevant Orders   Urinalysis, Routine w reflex microscopic   WET PREP FOR TRICH, YEAST, CLUE       Follow up plan: Return if symptoms worsen or fail to improve.

## 2020-12-27 NOTE — Assessment & Plan Note (Signed)
Noted with more scattered areas over past years, in sun often, using sun screen.  Will send to dermatology for continuation of yearly skin exams and assessment of angiomas.

## 2020-12-27 NOTE — Patient Instructions (Signed)

## 2021-01-02 ENCOUNTER — Ambulatory Visit: Payer: BC Managed Care – PPO | Admitting: Dermatology

## 2021-01-02 ENCOUNTER — Other Ambulatory Visit: Payer: Self-pay

## 2021-01-02 DIAGNOSIS — E063 Autoimmune thyroiditis: Secondary | ICD-10-CM | POA: Diagnosis not present

## 2021-01-02 DIAGNOSIS — D1801 Hemangioma of skin and subcutaneous tissue: Secondary | ICD-10-CM

## 2021-01-02 NOTE — Progress Notes (Signed)
   New Patient Visit  Subjective  Cindy Jefferson is a 65 y.o. female who presents for the following: Other (Red spots trunk and arms - have started popping up over the last couple of years.).  Referred by Marnee Guarneri, NP  The following portions of the chart were reviewed this encounter and updated as appropriate:   Tobacco  Allergies  Meds  Problems  Med Hx  Surg Hx  Fam Hx     Review of Systems:  No other skin or systemic complaints except as noted in HPI or Assessment and Plan.  Objective  Well appearing patient in no apparent distress; mood and affect are within normal limits.  All skin waist up examined.  Objective  Abdomen, chest, arms: Red papules  Assessment & Plan   She has Hashimoto's disease and takes levothyroxine.  Hemangiomas of skin Abdomen, chest, arms Benign-appearing.  Observation.  Call clinic for new or changing lesions.  Recommend daily use of broad spectrum spf 30+ sunscreen to sun-exposed areas.  Discussed condition and treatment options. Advised patient likely hereditary and are harmless. Discussed BBL/laser treatment and fee is $350/treatment.  Return for TBSE.  I, Ashok Cordia, CMA, am acting as scribe for Sarina Ser, MD .  Documentation: I have reviewed the above documentation for accuracy and completeness, and I agree with the above.  Sarina Ser, MD

## 2021-01-02 NOTE — Patient Instructions (Addendum)
If you have any questions or concerns for your doctor, please call our main line at [phone] and press option 4 to reach your doctor's medical assistant. If no one answers, please leave a voicemail as directed and we will return your call as soon as possible. Messages left after 4 pm will be answered the following business day.   You may also send [place] a message via [place]. We typically respond to MyChart messages within 1-2 business days.  For prescription refills, please ask your pharmacy to contact our office. Our fax number is [phone].  If you have an urgent issue when the clinic is closed that cannot wait until the next business day, you can page your doctor at the number below.    Please note that while we do our best to be available for urgent issues outside of office hours, we are not available 24/7.   If you have an urgent issue and are unable to reach [place], you may choose to seek medical care at your doctor's office, retail clinic, urgent care center, or emergency room.  If you have a medical emergency, please immediately call 911 or go to the emergency department.  Pager Numbers  - Dr. Nehemiah Massed: [phone]  - Dr. Laurence Ferrari: [phone]  - Dr. Nicole Kindred: [phone]  In the event of inclement weather, please call our main line at [phone] for an update on the status of any delays or closures.  Dermatology Medication Tips: Please keep the boxes that topical medications come in in order to help keep track of the instructions about where and how to use these. Pharmacies typically print the medication instructions only on the boxes and not directly on the medication tubes.   If your medication is too expensive, please contact our office at [phone] option 4 or send [place] a message through [place].   We are unable to tell what your co-pay for medications will be in advance as this is different depending on your insurance coverage. However, we may be able to find a substitute  medication at lower cost or fill out paperwork to get insurance to cover a needed medication.   If a prior authorization is required to get your medication covered by your insurance company, please allow [place] 1-2 business days to complete this process.  Drug prices often vary depending on where the prescription is filled and some pharmacies may offer cheaper prices.  The website www.goodrx.com contains coupons for medications through different pharmacies. The prices here do not account for what the cost may be with help from insurance (it may be cheaper with your insurance), but the website can give you the price if you did not use any insurance.  - You can print the associated coupon and take it with your prescription to the pharmacy.  - You may also stop by our office during regular business hours and pick up a GoodRx coupon card.  - If you need your prescription sent electronically to a different pharmacy, notify our office through [facility] or by phone at [phone] option 4.  Hemangiomas - Red papules - Discussed benign nature - Observe - Call for any changes

## 2021-01-05 ENCOUNTER — Encounter: Payer: Self-pay | Admitting: Dermatology

## 2021-02-14 DIAGNOSIS — M722 Plantar fascial fibromatosis: Secondary | ICD-10-CM | POA: Diagnosis not present

## 2021-02-14 DIAGNOSIS — M79672 Pain in left foot: Secondary | ICD-10-CM | POA: Diagnosis not present

## 2021-03-10 ENCOUNTER — Other Ambulatory Visit: Payer: Self-pay

## 2021-03-10 ENCOUNTER — Encounter: Payer: Self-pay | Admitting: Nurse Practitioner

## 2021-03-10 ENCOUNTER — Ambulatory Visit (INDEPENDENT_AMBULATORY_CARE_PROVIDER_SITE_OTHER): Payer: BC Managed Care – PPO | Admitting: Nurse Practitioner

## 2021-03-10 VITALS — BP 116/73 | HR 55 | Temp 97.5°F | Ht 66.5 in | Wt 166.8 lb

## 2021-03-10 DIAGNOSIS — E7800 Pure hypercholesterolemia, unspecified: Secondary | ICD-10-CM

## 2021-03-10 DIAGNOSIS — E039 Hypothyroidism, unspecified: Secondary | ICD-10-CM

## 2021-03-10 DIAGNOSIS — K219 Gastro-esophageal reflux disease without esophagitis: Secondary | ICD-10-CM

## 2021-03-10 DIAGNOSIS — Z0000 Encounter for general adult medical examination without abnormal findings: Secondary | ICD-10-CM

## 2021-03-10 DIAGNOSIS — M85851 Other specified disorders of bone density and structure, right thigh: Secondary | ICD-10-CM

## 2021-03-10 DIAGNOSIS — G43119 Migraine with aura, intractable, without status migrainosus: Secondary | ICD-10-CM

## 2021-03-10 DIAGNOSIS — E663 Overweight: Secondary | ICD-10-CM

## 2021-03-10 NOTE — Assessment & Plan Note (Signed)
Chronic, stable at this time with Ajovy and Nurtec.  Continue current medication regimen as prescribed by neurology and collaboration with neurology.  She has history of pancreatitis with medications used for migraines, will need to be cautious with treatment.  Return in 6 months.

## 2021-03-10 NOTE — Assessment & Plan Note (Signed)
Chronic, stable with TSH within normal range.  Continue current regimen and adjust as needed.  TSH, Free T4, and thyroid antibody today.   Return in 6 months.

## 2021-03-10 NOTE — Assessment & Plan Note (Signed)
Recommended eating smaller high protein, low fat meals more frequently and exercising 30 mins a day 5 times a week with a goal of 10-15lb weight loss in the next 3 months. Patient voiced their understanding and motivation to adhere to these recommendations.  

## 2021-03-10 NOTE — Assessment & Plan Note (Signed)
Chronic, last DEXA August 2021.  Continue Vitamin D, may need to utilize higher weekly doses in future.  Check level today.  Consider bisphosphonate if osteoporosis presents -- if poorly tolerated consider injectable.

## 2021-03-10 NOTE — Assessment & Plan Note (Signed)
Ongoing, with ASCVD 3.6%.  Continue focus on diet regimen, discussed at length with her.  Poor tolerance to fish oil due to GERD.   Return in 6 months.  Check lipid panel today.

## 2021-03-10 NOTE — Patient Instructions (Signed)

## 2021-03-10 NOTE — Assessment & Plan Note (Signed)
Chronic, ongoing.  Continue current medication regimen and adjust dose as needed. Mag level if more consistent use.

## 2021-03-10 NOTE — Progress Notes (Signed)
BP 116/73   Pulse (!) 55   Temp (!) 97.5 F (36.4 C) (Oral)   Ht 5' 6.5" (1.689 m)   Wt 166 lb 12.8 oz (75.7 kg)   SpO2 (!) 77%   BMI 26.52 kg/m    Subjective:    Patient ID: Cindy Jefferson, female    DOB: 01/30/1956, 65 y.o.   MRN: [id number]  HPI: Cindy Jefferson is a 65 y.o. female presenting on 03/10/2021 for comprehensive medical examination. Current medical complaints include:none  She currently lives with: husband and mother-in-law Menopausal Symptoms: no   Diagnosis of migraines and followed by neurology with last visit 03/02/2020 -- continues Ajovy and Nurtec.  Reports the Ajovy has been life changing for her.  Has migraine once to twice a month.    HYPOTHYROIDISM Recent TSH 1.480 in June 2021 and continues on Levothyroxine 75 MCG.  Was told in past she had Hashimoto's. Thyroid control status:stable Satisfied with current treatment? yes Medication side effects: no Medication compliance: good compliance Etiology of hypothyroidism:  Recent dose adjustment:no Fatigue: no Cold intolerance: no Heat intolerance: no Weight gain: no Weight loss: no Constipation: no Diarrhea/loose stools: no Palpitations: no Lower extremity edema: no Anxiety/depressed mood: no    OSTEOPENIA Last DEXA 04/14/2020 with hip T score -2.4 and femur -2.2 -- some improvement noted.  No current bisphosphonate -- she had been on something in past.  Vitamin D level last was 32.3 Satisfied with current treatment?: yes Medication side effects: no Medication compliance: good compliance Past osteoporosis medications/treatments: calcium and Vit D Adequate calcium & vitamin D: yes Weight bearing exercises: yes    HYPERLIPIDEMIA No current medications.  Diet focused.   Hyperlipidemia status: good compliance Supplements: none Aspirin:  no The 10-year ASCVD risk score Mikey Bussing DC Jr., et al., 2013) is: 3.6%   Values used to calculate the score:     Age: 63 years     Sex: Female     Is  Non-Hispanic African American: No     Diabetic: No     Tobacco smoker: No     Systolic Blood Pressure: 948 mmHg     Is BP treated: No     HDL Cholesterol: 79 mg/dL     Total Cholesterol: 208 mg/dL Chest pain:  no Coronary artery disease:  no Family history CAD:  yes Family history early CAD:  no   GERD Continues on Pepcid as needed, not often -- maybe once a week. GERD control status: stable  Satisfied with current treatment? yes Heartburn frequency: minimal Medication side effects: no  Medication compliance: stable Previous GERD medications: Antacid use frequency:  none Dysphagia: no Odynophagia:  no Hematemesis: no Blood in stool: no EGD: yes  Depression Screen done today and results listed below:  Depression screen [facility] 2/9 03/10/2021 02/29/2020 01/28/2019 09/15/2018  Decreased Interest 0 0 0 0  Down, Depressed, Hopeless 0 0 0 0  PHQ - 2 Score 0 0 0 0  Altered sleeping - - 0 0  Tired, decreased energy - - 0 0  Change in appetite - - 0 0  Feeling bad or failure about yourself  - - 0 0  Trouble concentrating - - 0 0  Moving slowly or fidgety/restless - - 0 0  Suicidal thoughts - - 0 0  PHQ-9 Score - - 0 0  Difficult doing work/chores - - Not difficult at all Not difficult at all    The patient does not have a history of falls. I  did not complete a risk assessment for falls. A plan of care for falls was not documented.   Past Medical History:  Past Medical History:  Diagnosis Date   GERD (gastroesophageal reflux disease)    Migraine    Migraines    Pancreatitis    Thyroid disease     Surgical History:  Past Surgical History:  Procedure Laterality Date   ABDOMINAL HYSTERECTOMY     total, does not get pap smears    CHOLECYSTECTOMY     FOOT SURGERY     neck fusion      Medications:  Current Outpatient Medications on File Prior to Visit  Medication Sig   AJOVY 225 MG/1.5ML SOAJ Inject 1.5 mLs into the skin every 28 (twenty-eight) days.    Calcium-Cholecalciferol 200-250 MG-UNIT TABS Take by mouth.   Cholecalciferol (VITAMIN D3) 2000 units capsule Take by mouth.   diclofenac sodium (VOLTAREN) 1 % GEL Apply 2 g topically as needed (four times a day as needed for neck pain).   famotidine (PEPCID) 20 MG tablet TAKE 1 TABLET (20 MG TOTAL) BY MOUTH ONCE DAILY DISCONTINUE ZANTAC   levothyroxine (SYNTHROID) 75 MCG tablet TAKE 1 TABLET BY MOUTH EVERY DAY   mometasone (NASONEX) 50 MCG/ACT nasal spray Place into the nose.   nitrofurantoin, macrocrystal-monohydrate, (MACROBID) 100 MG capsule Take 1 capsule (100 mg total) by mouth as needed (one capsule as needed after intercourse for UTI prevention).   NURTEC 75 MG TBDP TAKE 75 MG BY MOUTH ONCE DAILY AS NEEDED   No current facility-administered medications on file prior to visit.    Allergies:  Allergies  Allergen Reactions   Valproic Acid Other (See Comments)    Pancreatitis    Erythromycin Nausea Only   Tape Other (See Comments)    Skin Irritation      Social History:  Social History   Socioeconomic History   Marital status: Married    Spouse name: Not on file   Number of children: 2   Years of education: Not on file   Highest education level: Not on file  Occupational History   Occupation: retired  Tobacco Use   Smoking status: Never   Smokeless tobacco: Never  Vaping Use   Vaping Use: Never used  Substance and Sexual Activity   Alcohol use: Not Currently   Drug use: Never   Sexual activity: Yes  Other Topics Concern   Not on file  Social History Narrative   Not on file   Social Determinants of Health   Financial Resource Strain: Not on file  Food Insecurity: Not on file  Transportation Needs: Not on file  Physical Activity: Not on file  Stress: Not on file  Social Connections: Not on file  Intimate Partner Violence: Not on file   Social History   Tobacco Use  Smoking Status Never  Smokeless Tobacco Never   Social History   Substance and  Sexual Activity  Alcohol Use Not Currently    Family History:  Family History  Problem Relation Age of Onset   Hyperlipidemia Mother    Heart disease Mother    Lung cancer Mother    Leukemia Father    Arthritis Sister    Thyroid disease Sister    Lupus Daughter    Colon cancer Maternal Grandfather    Kidney cancer Neg Hx    Breast cancer Neg Hx     Past medical history, surgical history, medications, allergies, family history and social history reviewed with patient today  and changes made to appropriate areas of the chart.   Review of Systems - negative All other ROS negative except what is listed above and in the HPI.      Objective:    BP 116/73   Pulse (!) 55   Temp (!) 97.5 F (36.4 C) (Oral)   Ht 5' 6.5" (1.689 m)   Wt 166 lb 12.8 oz (75.7 kg)   SpO2 (!) 77%   BMI 26.52 kg/m   Wt Readings from Last 3 Encounters:  03/10/21 166 lb 12.8 oz (75.7 kg)  12/27/20 171 lb 6.4 oz (77.7 kg)  02/29/20 167 lb 9.6 oz (76 kg)    Physical Exam Vitals and nursing note reviewed. Exam conducted with a chaperone present.  Constitutional:      General: She is awake. She is not in acute distress.    Appearance: She is well-developed and well-groomed. She is not ill-appearing or toxic-appearing.  HENT:     Head: Normocephalic and atraumatic.     Right Ear: Hearing, tympanic membrane, ear canal and external ear normal. No drainage.     Left Ear: Hearing, tympanic membrane, ear canal and external ear normal. No drainage.     Nose: Nose normal.     Right Sinus: No maxillary sinus tenderness or frontal sinus tenderness.     Left Sinus: No maxillary sinus tenderness or frontal sinus tenderness.     Mouth/Throat:     Mouth: Mucous membranes are moist.     Pharynx: Oropharynx is clear. Uvula midline. No pharyngeal swelling, oropharyngeal exudate or posterior oropharyngeal erythema.  Eyes:     General: Lids are normal.        Right eye: No discharge.        Left eye: No discharge.      Extraocular Movements: Extraocular movements intact.     Conjunctiva/sclera: Conjunctivae normal.     Pupils: Pupils are equal, round, and reactive to light.     Visual Fields: Right eye visual fields normal and left eye visual fields normal.  Neck:     Thyroid: No thyromegaly.     Vascular: No carotid bruit.     Trachea: Trachea normal.  Cardiovascular:     Rate and Rhythm: Normal rate and regular rhythm.     Heart sounds: Normal heart sounds. No murmur heard.   No gallop.  Pulmonary:     Effort: Pulmonary effort is normal. No accessory muscle usage or respiratory distress.     Breath sounds: Normal breath sounds.  Chest:  Breasts:    Right: Normal. No axillary adenopathy or supraclavicular adenopathy.     Left: Normal. No axillary adenopathy or supraclavicular adenopathy.  Abdominal:     General: Bowel sounds are normal.     Palpations: Abdomen is soft. There is no hepatomegaly or splenomegaly.     Tenderness: There is no abdominal tenderness.  Musculoskeletal:        General: Normal range of motion.     Cervical back: Normal range of motion and neck supple.     Right lower leg: No edema.     Left lower leg: No edema.  Lymphadenopathy:     Head:     Right side of head: No submental, submandibular, tonsillar, preauricular or posterior auricular adenopathy.     Left side of head: No submental, submandibular, tonsillar, preauricular or posterior auricular adenopathy.     Cervical: No cervical adenopathy.     Upper Body:     Right upper  body: No supraclavicular, axillary or pectoral adenopathy.     Left upper body: No supraclavicular, axillary or pectoral adenopathy.  Skin:    General: Skin is warm and dry.     Capillary Refill: Capillary refill takes less than 2 seconds.     Findings: No rash.  Neurological:     Mental Status: She is alert and oriented to person, place, and time.     Cranial Nerves: Cranial nerves are intact.     Gait: Gait is intact.     Deep Tendon  Reflexes: Reflexes are normal and symmetric.     Reflex Scores:      Brachioradialis reflexes are 2+ on the right side and 2+ on the left side.      Patellar reflexes are 2+ on the right side and 2+ on the left side. Psychiatric:        Attention and Perception: Attention normal.        Mood and Affect: Mood normal.        Speech: Speech normal.        Behavior: Behavior normal. Behavior is cooperative.        Thought Content: Thought content normal.        Judgment: Judgment normal.    Results for orders placed or performed in visit on 12/27/20  WET PREP FOR [place], YEAST, CLUE   Specimen: Sterile Swab   Sterile Swab  Result Value Ref Range   Trichomonas Exam Negative Negative   Yeast Exam Negative Negative   Clue Cell Exam Negative Negative  Urinalysis, Routine w reflex microscopic  Result Value Ref Range   Specific Gravity, UA <1.005 (L) 1.005 - 1.030   pH, UA 6.0 5.0 - 7.5   Color, UA Yellow Yellow   Appearance Ur Clear Clear   Leukocytes,UA Negative Negative   Protein,UA Negative Negative/Trace   Glucose, UA Negative Negative   Ketones, UA Negative Negative   RBC, UA Negative Negative   Bilirubin, UA Negative Negative   Urobilinogen, Ur 0.2 0.2 - 1.0 mg/dL   Nitrite, UA Negative Negative      Assessment & Plan:   Problem List Items Addressed This Visit       Cardiovascular and Mediastinum   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  Continue current medication regimen as prescribed by neurology and collaboration with neurology.  She has history of pancreatitis with medications used for migraines, will need to be cautious with treatment.  Return in 6 months.          Digestive   GERD without esophagitis    Chronic, ongoing.  Continue current medication regimen and adjust dose as needed. Mag level if more consistent use.         Endocrine   Acquired hypothyroidism - Primary    Chronic,  stable with TSH within normal range.  Continue current regimen and adjust as needed.  TSH, Free T4, and thyroid antibody today.   Return in 6 months.        Relevant Orders   CBC with Differential/Platelet   TSH   T4, free   Thyroid peroxidase antibody     Musculoskeletal and Integument   Osteopenia    Chronic, last DEXA August 2021.  Continue Vitamin D, may need to utilize higher weekly doses in future.  Check level today.  Consider bisphosphonate if osteoporosis presents -- if poorly tolerated consider injectable.  Relevant Orders   CBC with Differential/Platelet   VITAMIN D 25 Hydroxy (Vit-D Deficiency, Fractures)     Other   Overweight (BMI 25.0-29.9)    Recommended eating smaller high protein, low fat meals more frequently and exercising 30 mins a day 5 times a week with a goal of 10-15lb weight loss in the next 3 months. Patient voiced their understanding and motivation to adhere to these recommendations.        Elevated LDL cholesterol level    Ongoing, with ASCVD 3.6%.  Continue focus on diet regimen, discussed at length with her.  Poor tolerance to fish oil due to GERD.   Return in 6 months.  Check lipid panel today.       Relevant Orders   Comprehensive metabolic panel   CBC with Differential/Platelet   Lipid Panel w/o Chol/HDL Ratio   Other Visit Diagnoses     Annual physical exam       Annual physical today with annual labs, health maintenance reviewed -- due for initial pneumococcal vaccine in August.        Follow up plan: Return in about 6 months (around 09/10/2021) for Hypothyroid --- may be initial Medicare Wellness as is starting Medicare soon.   LABORATORY TESTING:  - Pap smear: not applicable  IMMUNIZATIONS:   - Tdap: Tetanus vaccination status reviewed: last tetanus booster within 10 years. - Influenza: Up to date - Pneumovax: Not applicable  - Prevnar: Not applicable -- initial due in August - HPV: Not applicable - Zostavax vaccine:  Up to date --- 2018  SCREENING: -Mammogram: Up to date  - Colonoscopy: Up to date  - Bone Density: Up To Date -- due next year -Hearing Test: Not applicable  -Spirometry: Not applicable   PATIENT COUNSELING:   Advised to take 1 mg of folate supplement per day if capable of pregnancy.   Sexuality: Discussed sexually transmitted diseases, partner selection, use of condoms, avoidance of unintended pregnancy  and contraceptive alternatives.   Advised to avoid cigarette smoking.  I discussed with the patient that most people either abstain from alcohol or drink within safe limits (<=14/week and <=4 drinks/occasion for males, <=7/weeks and <= 3 drinks/occasion for females) and that the risk for alcohol disorders and other health effects rises proportionally with the number of drinks per week and how often a drinker exceeds daily limits.  Discussed cessation/primary prevention of drug use and availability of treatment for abuse.   Diet: Encouraged to adjust caloric intake to maintain  or achieve ideal body weight, to reduce intake of dietary saturated fat and total fat, to limit sodium intake by avoiding high sodium foods and not adding table salt, and to maintain adequate dietary potassium and calcium preferably from fresh fruits, vegetables, and low-fat dairy products.    Stressed the importance of regular exercise  Injury prevention: Discussed safety belts, safety helmets, smoke detector, smoking near bedding or upholstery.   Dental health: Discussed importance of regular tooth brushing, flossing, and dental visits.    NEXT PREVENTATIVE PHYSICAL DUE IN 1 YEAR. Return in about 6 months (around 09/10/2021) for Hypothyroid --- may be initial Medicare Wellness as is starting Medicare soon.

## 2021-03-11 LAB — CBC WITH DIFFERENTIAL/PLATELET
Basophils Absolute: 0.1 10*3/uL (ref 0.0–0.2)
Basos: 2 %
EOS (ABSOLUTE): 0.1 10*3/uL (ref 0.0–0.4)
Eos: 2 %
Hematocrit: 41.6 % (ref 34.0–46.6)
Hemoglobin: 14.1 g/dL (ref 11.1–15.9)
Immature Grans (Abs): 0 10*3/uL (ref 0.0–0.1)
Immature Granulocytes: 0 %
Lymphocytes Absolute: 1.8 10*3/uL (ref 0.7–3.1)
Lymphs: 35 %
MCH: 29.8 pg (ref 26.6–33.0)
MCHC: 33.9 g/dL (ref 31.5–35.7)
MCV: 88 fL (ref 79–97)
Monocytes Absolute: 0.4 10*3/uL (ref 0.1–0.9)
Monocytes: 8 %
Neutrophils Absolute: 2.8 10*3/uL (ref 1.4–7.0)
Neutrophils: 53 %
Platelets: 239 10*3/uL (ref 150–450)
RBC: 4.73 x10E6/uL (ref 3.77–5.28)
RDW: 12 % (ref 11.7–15.4)
WBC: 5.2 10*3/uL (ref 3.4–10.8)

## 2021-03-11 LAB — VITAMIN D 25 HYDROXY (VIT D DEFICIENCY, FRACTURES): Vit D, 25-Hydroxy: 33.3 ng/mL (ref 30.0–100.0)

## 2021-03-11 LAB — COMPREHENSIVE METABOLIC PANEL
ALT: 24 IU/L (ref 0–32)
AST: 30 IU/L (ref 0–40)
Albumin/Globulin Ratio: 1.9 (ref 1.2–2.2)
Albumin: 4.9 g/dL — ABNORMAL HIGH (ref 3.8–4.8)
Alkaline Phosphatase: 107 IU/L (ref 44–121)
BUN/Creatinine Ratio: 16 (ref 12–28)
BUN: 14 mg/dL (ref 8–27)
Bilirubin Total: 0.6 mg/dL (ref 0.0–1.2)
CO2: 25 mmol/L (ref 20–29)
Calcium: 9.7 mg/dL (ref 8.7–10.3)
Chloride: 100 mmol/L (ref 96–106)
Creatinine, Ser: 0.87 mg/dL (ref 0.57–1.00)
Globulin, Total: 2.6 g/dL (ref 1.5–4.5)
Glucose: 84 mg/dL (ref 65–99)
Potassium: 4.4 mmol/L (ref 3.5–5.2)
Sodium: 142 mmol/L (ref 134–144)
Total Protein: 7.5 g/dL (ref 6.0–8.5)
eGFR: 74 mL/min/{1.73_m2} (ref >59)

## 2021-03-11 LAB — TSH: TSH: 1.47 u[IU]/mL (ref 0.450–4.500)

## 2021-03-11 LAB — LIPID PANEL W/O CHOL/HDL RATIO
Cholesterol, Total: 233 mg/dL — ABNORMAL HIGH (ref 100–199)
HDL: 95 mg/dL (ref >39)
LDL Chol Calc (NIH): 122 mg/dL — ABNORMAL HIGH (ref 0–99)
Triglycerides: 94 mg/dL (ref 0–149)
VLDL Cholesterol Cal: 16 mg/dL (ref 5–40)

## 2021-03-11 LAB — T4, FREE: Free T4: 1.67 ng/dL (ref 0.82–1.77)

## 2021-03-11 LAB — THYROID PEROXIDASE ANTIBODY: Thyroperoxidase Ab SerPl-aCnc: 21 IU/mL (ref 0–34)

## 2021-03-11 NOTE — Progress Notes (Signed)
Contacted via MyChart The 10-year ASCVD risk score (Goff DC Jr., et al., 2013) is: 3.5%   Values used to calculate the score:     Age: 64 years     Sex: Female     Is Non-Hispanic African American: No     Diabetic: No     Tobacco smoker: No     Systolic Blood Pressure: 116 mmHg     Is BP treated: No     HDL Cholesterol: 95 mg/dL     Total Cholesterol: 233 mg/dL   Good evening Cindy Jefferson, your labs have returned: - Kidney function, creatinine and eGFR, is normal.  Liver function, AST and ALT, is normal. - CBC is normal with no anemia or infection - Cholesterol levels remain elevated, but risk score 3.5%.  As we discussed we can continue focus on diet and exercise at this time - Vitamin D level is normal - Thyroid levels are normal, can continue current Levothyroxine.  Your thyroid antibody which often looks for hashimoto's is normal, although on upper end of normal.  Often with Hashimoto's this is elevated above normal.  Any questions? Keep being awesome!!  Thank you for allowing me to participate in your care.  I appreciate you. Kindest regards, Jolene 

## 2021-05-10 ENCOUNTER — Encounter: Payer: BC Managed Care – PPO | Admitting: Dermatology

## 2021-06-20 ENCOUNTER — Other Ambulatory Visit: Payer: Self-pay | Admitting: Nurse Practitioner

## 2021-06-20 NOTE — Telephone Encounter (Signed)
Requested Prescriptions  Pending Prescriptions Disp Refills  . famotidine (PEPCID) 20 MG tablet [Pharmacy Med Name: FAMOTIDINE 20 MG TABLET] 90 tablet 1    Sig: TAKE 1 TABLET (20 MG TOTAL) BY MOUTH ONCE DAILY DISCONTINUE ZANTAC     Gastroenterology:  H2 Antagonists Passed - 06/20/2021  1:31 AM      Passed - Valid encounter within last 12 months    Recent Outpatient Visits          3 months ago Acquired hypothyroidism   [place], Barbaraann Faster, NP   5 months ago [place] angioma   [facility] [place], Marianne T, NP   7 months ago Osteopenia of neck of right femur   [place], Jolene T, NP   9 months ago Congestion of upper respiratory tract   [place], DO   1 year ago Physical exam, annual   [place], Barbaraann Faster, NP      Future Appointments            In 2 months [place], Barbaraann Faster, NP [employer]

## 2021-06-23 ENCOUNTER — Other Ambulatory Visit: Payer: Self-pay | Admitting: Nurse Practitioner

## 2021-06-23 DIAGNOSIS — Z1231 Encounter for screening mammogram for malignant neoplasm of breast: Secondary | ICD-10-CM

## 2021-06-27 ENCOUNTER — Other Ambulatory Visit: Payer: Self-pay

## 2021-06-27 ENCOUNTER — Ambulatory Visit: Payer: Self-pay

## 2021-06-27 ENCOUNTER — Ambulatory Visit (INDEPENDENT_AMBULATORY_CARE_PROVIDER_SITE_OTHER): Payer: Medicare Other | Admitting: Nurse Practitioner

## 2021-06-27 ENCOUNTER — Encounter: Payer: Self-pay | Admitting: Nurse Practitioner

## 2021-06-27 VITALS — BP 121/81 | HR 71 | Temp 97.8°F | Resp 18 | Ht 67.0 in | Wt 167.0 lb

## 2021-06-27 DIAGNOSIS — L259 Unspecified contact dermatitis, unspecified cause: Secondary | ICD-10-CM

## 2021-06-27 MED ORDER — PREDNISONE 10 MG PO TABS: ORAL_TABLET | ORAL | 0 refills | Status: DC

## 2021-06-27 NOTE — Patient Instructions (Signed)
Cool compresses Benadryl as needed Prednisone is sent to your pharmacy Tea tree oil, calamine lotion are other options

## 2021-06-27 NOTE — Telephone Encounter (Signed)
Pt needed advice stating she thinks she has chiggers, she has a lot of burning and itching on the back of her leg, pt was not sure if she should go to UC or schedule an appt.   Left message to call back.

## 2021-06-27 NOTE — Telephone Encounter (Addendum)
Reason for Disposition  [1] SEVERE local itching (i.e., interferes with work, school, sleep) AND [2] not improved after 24 hours of hydrocortisone cream  Answer Assessment - Initial Assessment Questions 1. TYPE of INSECT: "What type of insect was it?"      Pt calling in.  I think I have chiggers on the back of my leg.  I was walking through the woods yesterday.   I had leggings on yesterday.    I sat on a chair outside for a while.   It's 10 inch up from my knees and onto my butt checks.    It's burning and itching.    2. ONSET: "When did you get bitten?"      Yesterday 3. LOCATION: "Where is the insect bite located?"      10 inches above both my knees onto my butt checks.  I woke up with them this morning.   Every time I sit it's terrible.    4. REDNESS: "Is the area red or pink?" If Yes, ask: "What size is area of redness?" (inches or cm). "When did the redness start?"     My mother has been treated for bed bugs recently.   I sat on fabric chairs outside so I wouldn't think it's bed bug.  A company treated the bed bugs and they haven't had any problems. 5. PAIN: "Is there any pain?" If Yes, ask: "How bad is it?"  (Scale 1-10; or mild, moderate, severe)     Burning  6. ITCHING: "Does it itch?" If Yes, ask: "How bad is the itch?"    - MILD: doesn't interfere with normal activities   - MODERATE-SEVERE: interferes with work, school, sleep, or other activities      Yes terribly 7. SWELLING: "How big is the swelling?" (inches, cm, or compare to coins)     It's a red rash flat places.   They are red whelps now after taking a hot shower.   8. OTHER SYMPTOMS: "Do you have any other symptoms?"  (e.g., difficulty breathing, hives)     No 9. PREGNANCY: "Is there any chance you are pregnant?" "When was your last menstrual period?"     Not asked  Protocols used: Insect Bite-A-AH I made pt an in office appt with Vance Peper, NP for today at 3:40.    Pt said she could be at the office in time for this  appt. She thinks she has chiggers on the backs of both of her legs and part way up her buttocks that itch and burn terribly.   She was walking in the woods.   She woke up with this itching and burning this morning.

## 2021-06-27 NOTE — Progress Notes (Signed)
Acute Office Visit  Subjective:    Patient ID: Cindy Jefferson, female    DOB: [birth year], 65 y.o.   MRN: [id number]  Chief Complaint  Patient presents with   Rash    Back of thighs, buttocks    HPI Patient is in today because she was at parent's house yesterday who [place]. She walked through the grass and sat outside for a while yesterday as well.   RASH  Duration:  days  Location: legs  Itching: yes Burning: yes Redness: yes Oozing: no Scaling: no Blisters: no Painful: yes Fevers: no Change in detergents/soaps/personal care products: no Recent illness: no Recent travel:no History of same: yes - maybe about 20 years ago Context: worse Alleviating factors: nothing Treatments attempted: tea tree soap, clear nail polish Shortness of breath: no  Throat/tongue swelling: no Myalgias/arthralgias: no   Past Medical History:  Diagnosis Date   GERD (gastroesophageal reflux disease)    Migraine    Migraines    Pancreatitis    Thyroid disease     Past Surgical History:  Procedure Laterality Date   CHOLECYSTECTOMY     FOOT SURGERY     neck fusion     TOTAL ABDOMINAL HYSTERECTOMY     total, does not get pap smears     Family History  Problem Relation Age of Onset   Hyperlipidemia Mother    Heart disease Mother    Lung cancer Mother    Leukemia Father    Arthritis Sister    Thyroid disease Sister    Lupus Daughter    Colon cancer Maternal Grandfather    Kidney cancer Neg Hx    Breast cancer Neg Hx     Social History   Socioeconomic History   Marital status: Married    Spouse name: Not on file   Number of children: 2   Years of education: Not on file   Highest education level: Not on file  Occupational History   Occupation: retired  Tobacco Use   Smoking status: Never   Smokeless tobacco: Never  Vaping Use   Vaping Use: Never used  Substance and Sexual Activity   Alcohol use: Not Currently   Drug use: Never   Sexual  activity: Yes  Other Topics Concern   Not on file  Social History Narrative   Not on file   Social Determinants of Health   Financial Resource Strain: Not on file  Food Insecurity: Not on file  Transportation Needs: Not on file  Physical Activity: Not on file  Stress: Not on file  Social Connections: Not on file  Intimate Partner Violence: Not on file    Outpatient Medications Prior to Visit  Medication Sig Dispense Refill   AJOVY 225 MG/1.5ML SOAJ Inject 1.5 mLs into the skin every 28 (twenty-eight) days.     Calcium-Cholecalciferol 200-250 MG-UNIT TABS Take by mouth.     Cholecalciferol (VITAMIN D3) 2000 units capsule Take by mouth.     diclofenac sodium (VOLTAREN) 1 % GEL Apply 2 g topically as needed (four times a day as needed for neck pain). 1 Tube 2   famotidine (PEPCID) 20 MG tablet TAKE 1 TABLET (20 MG TOTAL) BY MOUTH ONCE DAILY DISCONTINUE ZANTAC 90 tablet 1   levothyroxine (SYNTHROID) 75 MCG tablet TAKE 1 TABLET BY MOUTH EVERY DAY 90 tablet 3   mometasone (NASONEX) 50 MCG/ACT nasal spray Place into the nose.     NURTEC 75 MG TBDP TAKE 75  MG BY MOUTH ONCE DAILY AS NEEDED     nitrofurantoin, macrocrystal-monohydrate, (MACROBID) 100 MG capsule Take 1 capsule (100 mg total) by mouth as needed (one capsule as needed after intercourse for UTI prevention). (Patient not taking: Reported on 06/27/2021) 60 capsule 1   No facility-administered medications prior to visit.    Allergies  Allergen Reactions   Valproic Acid Other (See Comments)    Pancreatitis    Erythromycin Nausea Only   Tape Other (See Comments)    Skin Irritation      Review of Systems  Constitutional: Negative.   Respiratory: Negative.    Cardiovascular: Negative.   Gastrointestinal: Negative.   Genitourinary: Negative.   Musculoskeletal: Negative.   Skin:  Positive for rash.  Neurological: Negative.       Objective:    Physical Exam Vitals and nursing note reviewed.  Constitutional:       General: She is not in acute distress.    Appearance: Normal appearance.  HENT:     Head: Normocephalic.  Eyes:     Conjunctiva/sclera: Conjunctivae normal.  Cardiovascular:     Rate and Rhythm: Normal rate.     Pulses: Normal pulses.  Pulmonary:     Effort: Pulmonary effort is normal.  Musculoskeletal:     Cervical back: Normal range of motion.  Skin:    General: Skin is warm.     Findings: Rash present.     Comments: Multiple areas of raised red areas to bilateral posterior thighs.   Neurological:     General: No focal deficit present.     Mental Status: She is alert and oriented to person, place, and time.  Psychiatric:        Mood and Affect: Mood normal.        Behavior: Behavior normal.        Thought Content: Thought content normal.        Judgment: Judgment normal.    BP 121/81 (BP Location: Left Arm, Patient Position: Sitting)   Pulse 71   Temp 97.8 F (36.6 C) (Oral)   Resp 18   Ht 5' 7"  (1.702 m)   Wt 167 lb (75.8 kg)   SpO2 99%   BMI 26.16 kg/m  Wt Readings from Last 3 Encounters:  06/27/21 167 lb (75.8 kg)  03/10/21 166 lb 12.8 oz (75.7 kg)  12/27/20 171 lb 6.4 oz (77.7 kg)    Health Maintenance Due  Topic Date Due   Pneumonia Vaccine 61+ Years old (1 - PCV) Never done   COVID-19 Vaccine (3 - Pfizer risk series) 03/15/2020   INFLUENZA VACCINE  04/03/2021    There are no preventive care reminders to display for this patient.   Lab Results  Component Value Date   TSH 1.470 03/10/2021   Lab Results  Component Value Date   WBC 5.2 03/10/2021   HGB 14.1 03/10/2021   HCT 41.6 03/10/2021   MCV 88 03/10/2021   PLT 239 03/10/2021   Lab Results  Component Value Date   NA 142 03/10/2021   K 4.4 03/10/2021   CO2 25 03/10/2021   GLUCOSE 84 03/10/2021   BUN 14 03/10/2021   CREATININE 0.87 03/10/2021   BILITOT 0.6 03/10/2021   ALKPHOS 107 03/10/2021   AST 30 03/10/2021   ALT 24 03/10/2021   PROT 7.5 03/10/2021   ALBUMIN 4.9 (H) 03/10/2021    CALCIUM 9.7 03/10/2021   ANIONGAP 10 10/06/2019   EGFR 74 03/10/2021   Lab Results  Component  Value Date   CHOL 233 (H) 03/10/2021   Lab Results  Component Value Date   HDL 95 03/10/2021   Lab Results  Component Value Date   LDLCALC 122 (H) 03/10/2021   Lab Results  Component Value Date   TRIG 94 03/10/2021   No results found for: CHOLHDL No results found for: HGBA1C     Assessment & Plan:   Problem List Items Addressed This Visit   None Visit Diagnoses     Contact dermatitis, unspecified contact dermatitis type, unspecified trigger    -  Primary   Unknown cause after being outside in the woods. Will treat with prednsione taper. Can take benadryl prn, cool compresses, tea tree oil, or calamine lotion.         Meds ordered this encounter  Medications   predniSONE (DELTASONE) 10 MG tablet    Sig: Take 6 tablets today and tomorrow, then 5 tablets the next 2 days, then decrease by 1 tablet every other day until gone    Dispense:  42 tablet    Refill:  0     Charyl Dancer, NP

## 2021-06-28 ENCOUNTER — Ambulatory Visit: Payer: BC Managed Care – PPO | Admitting: Dermatology

## 2021-07-10 DIAGNOSIS — Z1211 Encounter for screening for malignant neoplasm of colon: Secondary | ICD-10-CM

## 2021-07-13 ENCOUNTER — Other Ambulatory Visit: Payer: Self-pay

## 2021-07-13 ENCOUNTER — Ambulatory Visit
Admission: RE | Admit: 2021-07-13 | Discharge: 2021-07-13 | Disposition: A | Discharge status: Home / Self Care | Payer: Medicare Other | Source: Ambulatory Visit | Attending: Nurse Practitioner | Admitting: Nurse Practitioner

## 2021-07-13 DIAGNOSIS — Z1231 Encounter for screening mammogram for malignant neoplasm of breast: Secondary | ICD-10-CM | POA: Insufficient documentation

## 2021-07-13 NOTE — Progress Notes (Signed)
Contacted via MyChart   Normal mammogram -- repeat in one year:)

## 2021-07-24 ENCOUNTER — Ambulatory Visit: Payer: Self-pay

## 2021-07-24 NOTE — Telephone Encounter (Signed)
Pt called she suspects she has COVID.  Her husband is COVID + and she has several S/S. She has tested herself nad is negative.   Her s/s started yesterday, and that is when she tested. She will test again latr today or tomorrow.  PT is interested in an Anti-viral medication being called in.   Pt had COVID at the start of the pandemic and was very sick.  Pt states that is medication is called in she will not take it until she has a + test.     Reason for Disposition  [1] COVID-19 infection suspected by caller or triager AND [2] mild symptoms (cough, fever, or others) AND [3] has not gotten tested yet  Answer Assessment - Initial Assessment Questions 1. COVID-19 DIAGNOSIS: "Who made your COVID-19 diagnosis?" "Was it confirmed by a positive lab test or self-test?" If not diagnosed by a doctor (or NP/PA), ask "Are there lots of cases (community spread) where you live?" Note: See public health department website, if unsure.     Household + - husband 2. COVID-19 EXPOSURE: "Was there any known exposure to COVID before the symptoms began?" CDC Definition of close contact: within 6 feet (2 meters) for a total of 15 minutes or more over a 24-hour period.      yes 3. ONSET: "When did the COVID-19 symptoms start?"      yesterday 4. WORST SYMPTOM: "What is your worst symptom?" (e.g., cough, fever, shortness of breath, muscle aches)     fever 5. COUGH: "Do you have a cough?" If Yes, ask: "How bad is the cough?"       no 6. FEVER: "Do you have a fever?" If Yes, ask: "What is your temperature, how was it measured, and when did it start?"     100.2 - 100.7 7. RESPIRATORY STATUS: "Describe your breathing?" (e.g., shortness of breath, wheezing, unable to speak)      Fine 8. BETTER-SAME-WORSE: "Are you getting better, staying the same or getting worse compared to yesterday?"  If getting worse, ask, "In what way?"     worse 9. HIGH RISK DISEASE: "Do you have any chronic medical problems?" (e.g., asthma,  heart or lung disease, weak immune system, obesity, etc.)     na 10. VACCINE: "Have you had the COVID-19 vaccine?" If Yes, ask: "Which one, how many shots, when did you get it?"       ns 11. BOOSTER: "Have you received your COVID-19 booster?" If Yes, ask: "Which one and when did you get it?"       ns 12. PREGNANCY: "Is there any chance you are pregnant?" "When was your last menstrual period?"       ns 13. OTHER SYMPTOMS: "Do you have any other symptoms?"  (e.g., chills, fatigue, headache, loss of smell or taste, muscle pain, sore throat)       Scratchy throat, chills,  14. O2 SATURATION MONITOR:  "Do you use an oxygen saturation monitor (pulse oximeter) at home?" If Yes, ask "What is your reading (oxygen level) today?" "What is your usual oxygen saturation reading?" (e.g., 95%)       no  Protocols used: Coronavirus (COVID-19) Diagnosed or Suspected-A-AH

## 2021-07-24 NOTE — Telephone Encounter (Signed)
Patient called, left VM to return the call to the office to discuss symptoms with a nurse.    Summary: covid and paxlovid   Pts husband was diagnosed with covid and pt now has symptoms / fever 100.2, chill, sore throat and congestion / pts symptoms started yesterday / pt wants to know if she would qualify for Paxlovid / and if this can be called into the pharmacy /please advise

## 2021-07-25 ENCOUNTER — Telehealth (INDEPENDENT_AMBULATORY_CARE_PROVIDER_SITE_OTHER): Payer: Medicare Other | Admitting: Family Medicine

## 2021-07-25 ENCOUNTER — Encounter: Payer: Self-pay | Admitting: Family Medicine

## 2021-07-25 VITALS — BP 126/82 | HR 94 | Temp 100.5°F

## 2021-07-25 DIAGNOSIS — U071 COVID-19: Secondary | ICD-10-CM

## 2021-07-25 MED ORDER — ONDANSETRON HCL 8 MG PO TABS: 8.0000 mg | ORAL_TABLET | Freq: Three times a day (TID) | ORAL | 0 refills | Status: DC | PRN

## 2021-07-25 MED ORDER — HYDROCOD POLST-CPM POLST ER 10-8 MG/5ML PO SUER: 5.0000 mL | Freq: Two times a day (BID) | ORAL | 0 refills | Status: DC | PRN

## 2021-07-25 MED ORDER — MOLNUPIRAVIR EUA 200MG CAPSULE: 4.0000 | ORAL_CAPSULE | Freq: Two times a day (BID) | ORAL | 0 refills | Status: AC

## 2021-07-25 MED ORDER — PREDNISONE 50 MG PO TABS: 50.0000 mg | ORAL_TABLET | Freq: Every day | ORAL | 0 refills | Status: DC

## 2021-07-25 MED ORDER — BENZONATATE 200 MG PO CAPS: 200.0000 mg | ORAL_CAPSULE | Freq: Two times a day (BID) | ORAL | 0 refills | Status: DC | PRN

## 2021-07-25 NOTE — Telephone Encounter (Signed)
Appointment scheduled.

## 2021-07-25 NOTE — Progress Notes (Signed)
BP 126/82   Pulse 94   Temp (!) 100.5 F (38.1 C)   SpO2 99%    Subjective:    Patient ID: Cindy Jefferson, female    DOB: 11/12/1955, 65 y.o.   MRN: [id number]  HPI: Cindy Jefferson is a 65 y.o. female  Chief Complaint  Patient presents with   Covid Positive    Patient states she tested positive for COVID this morning. Patient has congestion, fever, sore throat. Patient states symptoms began Sunday.   UPPER RESPIRATORY TRACT INFECTION Duration: 2 days Worst symptom: Fever: yes Cough: no Shortness of breath: no Wheezing: no Chest pain: no Chest tightness: no Chest congestion: no Nasal congestion: yes Runny nose: yes Post nasal drip: yes Sneezing: no Sore throat: yes Swollen glands: no Sinus pressure: yes Headache: yes Face pain: yes Toothache: no Ear pain: no  Ear pressure: yes right Eyes red/itching:no Eye drainage/crusting: no  Vomiting: no Rash: no Fatigue: no Sick contacts: yes Strep contacts: no  Context: worse Recurrent sinusitis: no Relief with OTC cold/cough medications: no  Treatments attempted: tylenol   Relevant past medical, surgical, family and social history reviewed and updated as indicated. Interim medical history since our last visit reviewed. Allergies and medications reviewed and updated.  Review of Systems  Constitutional:  Positive for fatigue and fever. Negative for activity change, appetite change, chills, diaphoresis and unexpected weight change.  HENT:  Positive for congestion, postnasal drip, rhinorrhea and sinus pressure. Negative for dental problem, drooling, ear discharge, ear pain, facial swelling, hearing loss, mouth sores, nosebleeds, sinus pain, sneezing, sore throat, tinnitus, trouble swallowing and voice change.   Eyes: Negative.   Respiratory:  Positive for cough. Negative for apnea, choking, chest tightness, shortness of breath, wheezing and stridor.   Cardiovascular: Negative.   Gastrointestinal: Negative.    Psychiatric/Behavioral: Negative.     Per HPI unless specifically indicated above     Objective:    BP 126/82   Pulse 94   Temp (!) 100.5 F (38.1 C)   SpO2 99%   Wt Readings from Last 3 Encounters:  06/27/21 167 lb (75.8 kg)  03/10/21 166 lb 12.8 oz (75.7 kg)  12/27/20 171 lb 6.4 oz (77.7 kg)    Physical Exam Vitals and nursing note reviewed.  Constitutional:      General: She is not in acute distress.    Appearance: Normal appearance. She is not ill-appearing, toxic-appearing or diaphoretic.  HENT:     Head: Normocephalic and atraumatic.     Right Ear: External ear normal.     Left Ear: External ear normal.     Nose: Nose normal.     Mouth/Throat:     Mouth: Mucous membranes are moist.     Pharynx: Oropharynx is clear.  Eyes:     General: No scleral icterus.       Right eye: No discharge.        Left eye: No discharge.     Conjunctiva/sclera: Conjunctivae normal.     Pupils: Pupils are equal, round, and reactive to light.  Pulmonary:     Effort: Pulmonary effort is normal. No respiratory distress.     Comments: Speaking in full sentences Musculoskeletal:        General: Normal range of motion.     Cervical back: Normal range of motion.  Skin:    Coloration: Skin is not jaundiced or pale.     Findings: No bruising, erythema, lesion or rash.  Neurological:  Mental Status: She is alert and oriented to person, place, and time. Mental status is at baseline.  Psychiatric:        Mood and Affect: Mood normal.        Behavior: Behavior normal.        Thought Content: Thought content normal.        Judgment: Judgment normal.    Results for orders placed or performed in visit on 03/10/21  Comprehensive metabolic panel  Result Value Ref Range   Glucose 84 65 - 99 mg/dL   BUN 14 8 - 27 mg/dL   Creatinine, Ser 0.87 0.57 - 1.00 mg/dL   eGFR 74 >59 mL/min/1.73   BUN/Creatinine Ratio 16 12 - 28   Sodium 142 134 - 144 mmol/L   Potassium 4.4 3.5 - 5.2 mmol/L    Chloride 100 96 - 106 mmol/L   CO2 25 20 - 29 mmol/L   Calcium 9.7 8.7 - 10.3 mg/dL   Total Protein 7.5 6.0 - 8.5 g/dL   Albumin 4.9 (H) 3.8 - 4.8 g/dL   Globulin, Total 2.6 1.5 - 4.5 g/dL   Albumin/Globulin Ratio 1.9 1.2 - 2.2   Bilirubin Total 0.6 0.0 - 1.2 mg/dL   Alkaline Phosphatase 107 44 - 121 IU/L   AST 30 0 - 40 IU/L   ALT 24 0 - 32 IU/L  CBC with Differential/Platelet  Result Value Ref Range   WBC 5.2 3.4 - 10.8 x10E3/uL   RBC 4.73 3.77 - 5.28 x10E6/uL   Hemoglobin 14.1 11.1 - 15.9 g/dL   Hematocrit 41.6 34.0 - 46.6 %   MCV 88 79 - 97 fL   MCH 29.8 26.6 - 33.0 pg   MCHC 33.9 31.5 - 35.7 g/dL   RDW 12.0 11.7 - 15.4 %   Platelets 239 150 - 450 x10E3/uL   Neutrophils 53 Not Estab. %   Lymphs 35 Not Estab. %   Monocytes 8 Not Estab. %   Eos 2 Not Estab. %   Basos 2 Not Estab. %   Neutrophils Absolute 2.8 1.4 - 7.0 x10E3/uL   Lymphocytes Absolute 1.8 0.7 - 3.1 x10E3/uL   Monocytes Absolute 0.4 0.1 - 0.9 x10E3/uL   EOS (ABSOLUTE) 0.1 0.0 - 0.4 x10E3/uL   Basophils Absolute 0.1 0.0 - 0.2 x10E3/uL   Immature Granulocytes 0 Not Estab. %   Immature Grans (Abs) 0.0 0.0 - 0.1 x10E3/uL  TSH  Result Value Ref Range   TSH 1.470 0.450 - 4.500 uIU/mL  Lipid Panel w/o Chol/HDL Ratio  Result Value Ref Range   Cholesterol, Total 233 (H) 100 - 199 mg/dL   Triglycerides 94 0 - 149 mg/dL   HDL 95 >39 mg/dL   VLDL Cholesterol Cal 16 5 - 40 mg/dL   LDL Chol Calc (NIH) 122 (H) 0 - 99 mg/dL  VITAMIN D 25 Hydroxy (Vit-D Deficiency, Fractures)  Result Value Ref Range   Vit D, 25-Hydroxy 33.3 30.0 - 100.0 ng/mL  T4, free  Result Value Ref Range   Free T4 1.67 0.82 - 1.77 ng/dL  Thyroid peroxidase antibody  Result Value Ref Range   Thyroperoxidase Ab SerPl-aCnc 21 0 - 34 IU/mL      Assessment & Plan:   Problem List Items Addressed This Visit   None Visit Diagnoses     COVID-19 virus infection    -  Primary   Will treat with molnopirovir and prednisone. Call if not getting  better or getting worse. Continue to monitor. Call with  any concerns.    Relevant Medications   molnupiravir EUA (LAGEVRIO) 200 mg CAPS capsule        Follow up plan: Return if symptoms worsen or fail to improve.   This visit was completed via video visit through MyChart due to the restrictions of the COVID-19 pandemic. All issues as above were discussed and addressed. Physical exam was done as above through visual confirmation on video through MyChart. If it was felt that the patient should be evaluated in the office, they were directed there. The patient verbally consented to this visit. Location of the patient: home Location of the provider: work Those involved with this call:  Provider: Park Liter, DO CMA: Louanna Raw, [place] Desk/Registration: [employer]  Time spent on call:  15 minutes with patient face to face via video conference. More than 50% of this time was spent in counseling and coordination of care. 23 minutes total spent in review of patient's record and preparation of their chart.

## 2021-08-09 ENCOUNTER — Ambulatory Visit: Payer: Medicare Other | Admitting: Nurse Practitioner

## 2021-09-11 ENCOUNTER — Ambulatory Visit
Admission: RE | Admit: 2021-09-11 | Discharge: 2021-09-11 | Disposition: A | Discharge status: Home / Self Care | Payer: Medicare Other | Source: Ambulatory Visit | Attending: Nurse Practitioner | Admitting: Nurse Practitioner

## 2021-09-11 ENCOUNTER — Other Ambulatory Visit: Payer: Self-pay

## 2021-09-11 ENCOUNTER — Ambulatory Visit
Admission: RE | Admit: 2021-09-11 | Discharge: 2021-09-11 | Disposition: A | Discharge status: Home / Self Care | Payer: Medicare Other | Attending: Nurse Practitioner | Admitting: Nurse Practitioner

## 2021-09-11 ENCOUNTER — Ambulatory Visit (INDEPENDENT_AMBULATORY_CARE_PROVIDER_SITE_OTHER): Payer: Medicare Other | Admitting: Nurse Practitioner

## 2021-09-11 ENCOUNTER — Encounter: Payer: Self-pay | Admitting: Nurse Practitioner

## 2021-09-11 VITALS — BP 112/73 | HR 62 | Temp 98.4°F | Wt 167.6 lb

## 2021-09-11 DIAGNOSIS — G8929 Other chronic pain: Secondary | ICD-10-CM | POA: Insufficient documentation

## 2021-09-11 DIAGNOSIS — H9313 Tinnitus, bilateral: Secondary | ICD-10-CM | POA: Diagnosis not present

## 2021-09-11 DIAGNOSIS — E039 Hypothyroidism, unspecified: Secondary | ICD-10-CM | POA: Diagnosis not present

## 2021-09-11 DIAGNOSIS — E7800 Pure hypercholesterolemia, unspecified: Secondary | ICD-10-CM

## 2021-09-11 DIAGNOSIS — M25561 Pain in right knee: Secondary | ICD-10-CM | POA: Diagnosis present

## 2021-09-11 DIAGNOSIS — Z23 Encounter for immunization: Secondary | ICD-10-CM | POA: Diagnosis not present

## 2021-09-11 DIAGNOSIS — E663 Overweight: Secondary | ICD-10-CM

## 2021-09-11 NOTE — Assessment & Plan Note (Signed)
Chronic, stable with TSH within normal range.  Continue current regimen and adjust as needed.  TSH and Free T4 today.   Return in 6 months.

## 2021-09-11 NOTE — Assessment & Plan Note (Signed)
Acute over past few months since Covid, suspect related to post nasal drainage which is ongoing post Covid.  Recommend she start Claritin 10 MG daily and Flonase nasal spray daily.  Overall exam reassuring.  Return for worsening or ongoing.

## 2021-09-11 NOTE — Patient Instructions (Signed)
IMAGING -- [address], [place], [place] [address]  Acute Knee Pain, Adult Many things can cause knee pain. Sometimes, knee pain is sudden (acute) and may be caused by damage, swelling, or irritation of the muscles and tissues that support your knee. The pain often goes away on its own with time and rest. If the pain does not go away, tests may be done to find out what is causing the pain. Follow these instructions at home: If you have a knee sleeve or brace:  Wear the knee sleeve or brace as told by your doctor. Take it off only as told by your doctor. Loosen it if your toes: Tingle. Become numb. Turn cold and blue. Keep it clean. If the knee sleeve or brace is not waterproof: Do not let it get wet. Cover it with a watertight covering when you take a bath or shower. Activity Rest your knee. Do not do things that cause pain or make pain worse. Avoid activities where both feet leave the ground at the same time (high-impact activities). Examples are running, jumping rope, and doing jumping jacks. Work with a physical therapist to make a safe exercise program, as told by your doctor. Managing pain, stiffness, and swelling  If told, put ice on the knee. To do this: If you have a removable knee sleeve or brace, take it off as told by your doctor. Put ice in a plastic bag. Place a towel between your skin and the bag. Leave the ice on for 20 minutes, 2-3 times a day. Take off the ice if your skin turns bright red. This is very important. If you cannot feel pain, heat, or cold, you have a greater risk of damage to the area. If told, use an elastic bandage to put pressure (compression) on your injured knee. Raise your knee above the level of your heart while you are sitting or lying down. Sleep with a pillow under your knee. General instructions Take over-the-counter and prescription medicines only as told by your doctor. Do not smoke or use any products that contain nicotine or  tobacco. If you need help quitting, ask your doctor. If you are overweight, work with your doctor and a food expert (dietitian) to set goals to lose weight. Being overweight can make your knee hurt more. Watch for any changes in your symptoms. Keep all follow-up visits. Contact a doctor if: The knee pain does not stop. The knee pain changes or gets worse. You have a fever along with knee pain. Your knee is red or feels warm when you touch it. Your knee gives out or locks up. Get help right away if: Your knee swells, and the swelling gets worse. You cannot move your knee. You have very bad knee pain that does not get better with pain medicine. Summary Many things can cause knee pain. The pain often goes away on its own with time and rest. Your doctor may do tests to find out the cause of the pain. Watch for any changes in your symptoms. Relieve your pain with rest, medicines, light activity, and use of ice. Get help right away if you cannot move your knee or your knee pain is very bad. This information is not intended to replace advice given to you by your health care provider. Make sure you discuss any questions you have with your health care provider. Document Revised: 02/03/2020 Document Reviewed: 02/03/2020 Elsevier Patient Education  2022 [employer].

## 2021-09-11 NOTE — Progress Notes (Signed)
BP 112/73    Pulse 62    Temp 98.4 F (36.9 C)    Wt 167 lb 9.6 oz (76 kg)    SpO2 97%    BMI 26.25 kg/m    Subjective:    Patient ID: Cindy Jefferson, female    DOB: 1956-06-20, 66 y.o.   MRN: [id number]  HPI: Cindy Jefferson is a 66 y.o. female  Chief Complaint  Patient presents with   Hypothyroidism   Knee Problem    Patient states she is having a pain and discomfort in her R knee and it started about 2 months ago. Patient states when she wakes up in the morning she doesn't have any pain, but throughout the day if she has to bend the knee, she will have the pain or discomfort.    Tinnitus    Patient states here and there she has this buzzing noise in her ears.   HYPOTHYROIDISM Continues on Levothyroxine 75 MCG daily. Thyroid control status:stable Satisfied with current treatment? yes Medication side effects: no Medication compliance: good compliance Etiology of hypothyroidism: acquired Recent dose adjustment:no Fatigue: none Cold intolerance: no Heat intolerance: no Weight gain: no Weight loss: no Constipation: no Diarrhea/loose stools: no Palpitations: no Lower extremity edema: no Anxiety/depressed mood: no   TINNITUS Occasional clicking in ears and then recently faint buzzing noise intermittently.  Noticed over past few months.  Had Covid around Thanksgiving.  Has had constant post nasal drip since Covid.  Not taking any medications for this + no nasal sprays -- has it at home. Duration: months Description of tinnitus: clicking and buzzing Pulsatile: no Tinnitus duration: hours Episode frequency: recurrent Severity: mild Aggravating factors: nothing Alleviating factors: nothing Head injury: no Chronic exposure to loud noises: no Exposure to ototoxic medications: no Vertigo:no Hearing loss: no Aural fullness: no Headache:no  TMJ syndrome symptoms: no Unsteady gait: no Postural instability: no Diplopia, dysarthria, dysphagia or weakness:  no Anxietydepression: no   KNEE PAIN To right knee, started about 2 months ago.  She recalls doing something prior to pain, but not sure what.  Wakes up in morning and is not hurting -- notices it more with a bent knee, driving, bending down. Duration: months Involved knee: right Mechanism of injury: unknown Location: anterior and lateral Onset: sudden Severity: 6/10 at worst Quality:  burning, dull, aching Frequency: intermittent Radiation: no Aggravating factors: weight bearing, bending, and movement  Alleviating factors: ice and rest  Status: stable Treatments attempted: rest, ice, and APAP  Relief with NSAIDs?:  No NSAIDs Taken Weakness with weight bearing or walking: no Sensation of giving way: no Locking: yes in the beginning Popping: yes in the beginning Bruising: no Swelling: yes in the beginning Redness: no Paresthesias/decreased sensation: no Fevers: no   Relevant past medical, surgical, family and social history reviewed and updated as indicated. Interim medical history since our last visit reviewed. Allergies and medications reviewed and updated.  Review of Systems  Constitutional:  Negative for activity change, appetite change, diaphoresis, fatigue and fever.  Respiratory:  Negative for cough, chest tightness and shortness of breath.   Cardiovascular:  Negative for chest pain, palpitations and leg swelling.  Gastrointestinal: Negative.   Endocrine: Negative for cold intolerance and heat intolerance.  Neurological: Negative.   Psychiatric/Behavioral: Negative.     Per HPI unless specifically indicated above     Objective:    BP 112/73    Pulse 62    Temp 98.4 F (36.9 C)  Wt 167 lb 9.6 oz (76 kg)    SpO2 97%    BMI 26.25 kg/m   Wt Readings from Last 3 Encounters:  09/11/21 167 lb 9.6 oz (76 kg)  06/27/21 167 lb (75.8 kg)  03/10/21 166 lb 12.8 oz (75.7 kg)    Physical Exam Vitals and nursing note reviewed.  Constitutional:      General: She is  awake. She is not in acute distress.    Appearance: She is well-developed and well-groomed. She is not ill-appearing or toxic-appearing.  HENT:     Head: Normocephalic.     Right Ear: Hearing, ear canal and external ear normal. A middle ear effusion is present. Tympanic membrane is not injected.     Left Ear: Hearing, ear canal and external ear normal. A middle ear effusion is present. Tympanic membrane is not injected.  Eyes:     General: Lids are normal.        Right eye: No discharge.        Left eye: No discharge.     Conjunctiva/sclera: Conjunctivae normal.     Pupils: Pupils are equal, round, and reactive to light.  Neck:     Thyroid: No thyromegaly.     Vascular: No carotid bruit.  Cardiovascular:     Rate and Rhythm: Normal rate and regular rhythm.     Heart sounds: Normal heart sounds. No murmur heard.   No gallop.  Pulmonary:     Effort: Pulmonary effort is normal. No accessory muscle usage or respiratory distress.     Breath sounds: Normal breath sounds.  Abdominal:     General: Bowel sounds are normal.     Palpations: Abdomen is soft.  Musculoskeletal:     Cervical back: Normal range of motion and neck supple.     Right lower leg: No edema.     Left lower leg: No edema.  Lymphadenopathy:     Cervical: No cervical adenopathy.  Skin:    General: Skin is warm and dry.  Neurological:     Mental Status: She is alert and oriented to person, place, and time.  Psychiatric:        Attention and Perception: Attention normal.        Mood and Affect: Mood normal.        Speech: Speech normal.        Behavior: Behavior normal. Behavior is cooperative.        Thought Content: Thought content normal.   Results for orders placed or performed in visit on 03/10/21  Comprehensive metabolic panel  Result Value Ref Range   Glucose 84 65 - 99 mg/dL   BUN 14 8 - 27 mg/dL   Creatinine, Ser 0.87 0.57 - 1.00 mg/dL   eGFR 74 >59 mL/min/1.73   BUN/Creatinine Ratio 16 12 - 28    Sodium 142 134 - 144 mmol/L   Potassium 4.4 3.5 - 5.2 mmol/L   Chloride 100 96 - 106 mmol/L   CO2 25 20 - 29 mmol/L   Calcium 9.7 8.7 - 10.3 mg/dL   Total Protein 7.5 6.0 - 8.5 g/dL   Albumin 4.9 (H) 3.8 - 4.8 g/dL   Globulin, Total 2.6 1.5 - 4.5 g/dL   Albumin/Globulin Ratio 1.9 1.2 - 2.2   Bilirubin Total 0.6 0.0 - 1.2 mg/dL   Alkaline Phosphatase 107 44 - 121 IU/L   AST 30 0 - 40 IU/L   ALT 24 0 - 32 IU/L  CBC with Differential/Platelet  Result Value Ref Range   WBC 5.2 3.4 - 10.8 x10E3/uL   RBC 4.73 3.77 - 5.28 x10E6/uL   Hemoglobin 14.1 11.1 - 15.9 g/dL   Hematocrit 41.6 34.0 - 46.6 %   MCV 88 79 - 97 fL   MCH 29.8 26.6 - 33.0 pg   MCHC 33.9 31.5 - 35.7 g/dL   RDW 12.0 11.7 - 15.4 %   Platelets 239 150 - 450 x10E3/uL   Neutrophils 53 Not Estab. %   Lymphs 35 Not Estab. %   Monocytes 8 Not Estab. %   Eos 2 Not Estab. %   Basos 2 Not Estab. %   Neutrophils Absolute 2.8 1.4 - 7.0 x10E3/uL   Lymphocytes Absolute 1.8 0.7 - 3.1 x10E3/uL   Monocytes Absolute 0.4 0.1 - 0.9 x10E3/uL   EOS (ABSOLUTE) 0.1 0.0 - 0.4 x10E3/uL   Basophils Absolute 0.1 0.0 - 0.2 x10E3/uL   Immature Granulocytes 0 Not Estab. %   Immature Grans (Abs) 0.0 0.0 - 0.1 x10E3/uL  TSH  Result Value Ref Range   TSH 1.470 0.450 - 4.500 uIU/mL  Lipid Panel w/o Chol/HDL Ratio  Result Value Ref Range   Cholesterol, Total 233 (H) 100 - 199 mg/dL   Triglycerides 94 0 - 149 mg/dL   HDL 95 >39 mg/dL   VLDL Cholesterol Cal 16 5 - 40 mg/dL   LDL Chol Calc (NIH) 122 (H) 0 - 99 mg/dL  VITAMIN D 25 Hydroxy (Vit-D Deficiency, Fractures)  Result Value Ref Range   Vit D, 25-Hydroxy 33.3 30.0 - 100.0 ng/mL  T4, free  Result Value Ref Range   Free T4 1.67 0.82 - 1.77 ng/dL  Thyroid peroxidase antibody  Result Value Ref Range   Thyroperoxidase Ab SerPl-aCnc 21 0 - 34 IU/mL      Assessment & Plan:   Problem List Items Addressed This Visit       Endocrine   Acquired hypothyroidism - Primary    Chronic, stable  with TSH within normal range.  Continue current regimen and adjust as needed.  TSH and Free T4 today.   Return in 6 months.       Relevant Orders   TSH   T4, free     Other   Chronic pain of right knee    Ongoing for two months, suspect some OA present based on exam.  No injury recently. At this time will obtain imaging for baseline.  Recommend: - Tylenol 1000 MG TID as needed (max total daily dose 3000 MG) - Wear knee support sleeve when up and working or moving - Perform PT exercises at home to strengthen and reduce pain - Apply ice as needed - Use Voltaren gel as needed.   Recommend she return to office as needed for worsening or ongoing, may obtain steroid injection if ongoing pain.      Relevant Orders   DG Knee Complete 4 Views Right   Overweight (BMI 25.0-29.9)    Recommended eating smaller high protein, low fat meals more frequently and exercising 30 mins a day 5 times a week with a goal of 10-15lb weight loss in the next 3 months. Patient voiced their understanding and motivation to adhere to these recommendations.       Tinnitus aurium, bilateral    Acute over past few months since Covid, suspect related to post nasal drainage which is ongoing post Covid.  Recommend she start Claritin 10 MG daily and Flonase nasal spray daily.  Overall exam reassuring.  Return for worsening or ongoing.      Other Visit Diagnoses     Flu vaccine need       Flu vaccine provided today.   Relevant Orders   Flu Vaccine QUAD High Dose(Fluad) (Completed)        Follow up plan: Return in about 26 weeks (around 03/12/2022) for Annual physical.

## 2021-09-11 NOTE — Assessment & Plan Note (Signed)
Recommended eating smaller high protein, low fat meals more frequently and exercising 30 mins a day 5 times a week with a goal of 10-15lb weight loss in the next 3 months. Patient voiced their understanding and motivation to adhere to these recommendations.  

## 2021-09-11 NOTE — Assessment & Plan Note (Signed)
Ongoing for two months, suspect some OA present based on exam.  No injury recently. At this time will obtain imaging for baseline.  Recommend: - Tylenol 1000 MG TID as needed (max total daily dose 3000 MG) - Wear knee support sleeve when up and working or moving - Perform PT exercises at home to strengthen and reduce pain - Apply ice as needed - Use Voltaren gel as needed.   Recommend she return to office as needed for worsening or ongoing, may obtain steroid injection if ongoing pain.

## 2021-09-12 LAB — TSH: TSH: 0.694 u[IU]/mL (ref 0.450–4.500)

## 2021-09-12 LAB — T4, FREE: Free T4: 1.88 ng/dL — ABNORMAL HIGH (ref 0.82–1.77)

## 2021-09-12 NOTE — Progress Notes (Signed)
Contacted via MyChart   Good morning Cindy Jefferson, your thyroid level has returned -- TSH is in normal range, lower aspect of this but normal, and Free T4 is slightly elevated.  For now I recommend we continue your current Levothyroxine dosing and recheck next visit.  However, if any increased anxiety, decreased appetite, diarrhea frequently then let me know and we will recheck sooner to ensure you are not going more into the hyperthyroid range.  If this happens we will need to change dosing.  Any questions? Keep being awesome!!  Thank you for allowing me to participate in your care.  I appreciate you. Kindest regards, Joelee Snoke

## 2021-09-12 NOTE — Progress Notes (Signed)
Contacted via [place] morning Kilie, your imaging has returned.  Overall there is actually no joint space loss, arthritis in the joint, which is great news.  However, there is an area where you are having the discomfort that could be some bone overgrowth or a bone spur.  In this scenario I would say if the pain becomes worse or continues let me know and I would recommend we get you into orthopedics for further assessment as that area is most likely what is causing the discomfort.  Any questions for me on this? Keep being amazing!!  Thank you for allowing me to participate in your care.  I appreciate you. Kindest regards, Safaa Stingley

## 2021-09-14 ENCOUNTER — Other Ambulatory Visit: Payer: Self-pay

## 2021-09-14 ENCOUNTER — Encounter: Payer: Self-pay | Admitting: Dermatology

## 2021-09-14 ENCOUNTER — Ambulatory Visit (INDEPENDENT_AMBULATORY_CARE_PROVIDER_SITE_OTHER): Payer: Medicare Other | Admitting: Dermatology

## 2021-09-14 DIAGNOSIS — Z808 Family history of malignant neoplasm of other organs or systems: Secondary | ICD-10-CM

## 2021-09-14 DIAGNOSIS — L918 Other hypertrophic disorders of the skin: Secondary | ICD-10-CM

## 2021-09-14 DIAGNOSIS — Z1283 Encounter for screening for malignant neoplasm of skin: Secondary | ICD-10-CM | POA: Diagnosis not present

## 2021-09-14 DIAGNOSIS — L578 Other skin changes due to chronic exposure to nonionizing radiation: Secondary | ICD-10-CM

## 2021-09-14 DIAGNOSIS — L719 Rosacea, unspecified: Secondary | ICD-10-CM

## 2021-09-14 DIAGNOSIS — L814 Other melanin hyperpigmentation: Secondary | ICD-10-CM

## 2021-09-14 DIAGNOSIS — D229 Melanocytic nevi, unspecified: Secondary | ICD-10-CM

## 2021-09-14 DIAGNOSIS — L821 Other seborrheic keratosis: Secondary | ICD-10-CM

## 2021-09-14 DIAGNOSIS — I781 Nevus, non-neoplastic: Secondary | ICD-10-CM | POA: Diagnosis not present

## 2021-09-14 DIAGNOSIS — D1800 Hemangioma unspecified site: Secondary | ICD-10-CM

## 2021-09-14 DIAGNOSIS — Z872 Personal history of diseases of the skin and subcutaneous tissue: Secondary | ICD-10-CM

## 2021-09-14 NOTE — Progress Notes (Signed)
Follow-Up Visit   Subjective  Cindy Jefferson is a 66 y.o. female who presents for the following: Total body skin exam (No hx of skin ca, Sister with hx of melanoma, hx of Aks in past). The patient presents for Total-Body Skin Exam (TBSE) for skin cancer screening and mole check.  The patient has spots, moles and lesions to be evaluated, some may be new or changing and the patient has concerns that these could be cancer.  The following portions of the chart were reviewed this encounter and updated as appropriate:   Tobacco   Allergies   Meds   Problems   Med Hx   Surg Hx   Fam Hx      Review of Systems:  No other skin or systemic complaints except as noted in HPI or Assessment and Plan.  Objective  Well appearing patient in no apparent distress; mood and affect are within normal limits.  A full examination was performed including scalp, head, eyes, ears, nose, lips, neck, chest, axillae, abdomen, back, buttocks, bilateral upper extremities, bilateral lower extremities, hands, feet, fingers, toes, fingernails, and toenails. All findings within normal limits unless otherwise noted below.  Head - Anterior (Face) Erythema cheeks, chin, nose  bil legs Telangiectasias bil legs   Assessment & Plan   Lentigines - Scattered tan macules - Due to sun exposure - Benign-appearing, observe - Recommend daily broad spectrum sunscreen SPF 30+ to sun-exposed areas, reapply every 2 hours as needed. - Call for any changes  Seborrheic Keratoses - Stuck-on, waxy, tan-brown papules and/or plaques  - Benign-appearing - Discussed benign etiology and prognosis. - Observe - Call for any changes  Melanocytic Nevi - Tan-brown and/or pink-flesh-colored symmetric macules and papules - Benign appearing on exam today - Observation - Call clinic for new or changing moles - Recommend daily use of broad spectrum spf 30+ sunscreen to sun-exposed areas.   Hemangiomas - Red papules - Discussed benign  nature - Observe - Call for any changes  Actinic Damage - Chronic condition, secondary to cumulative UV/sun exposure - diffuse scaly erythematous macules with underlying dyspigmentation - Recommend daily broad spectrum sunscreen SPF 30+ to sun-exposed areas, reapply every 2 hours as needed.  - Staying in the shade or wearing long sleeves, sun glasses (UVA+UVB protection) and wide brim hats (4-inch brim around the entire circumference of the hat) are also recommended for sun protection.  - Call for new or changing lesions.  Skin cancer screening performed today.  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 It is treatable but not curable. It sometimes affects the eyes (ocular rosacea) as well. It may respond to topical and/or systemic medication and can flare with stress, sun exposure, alcohol, exercise and some foods.  Daily application of broad spectrum spf 30+ sunscreen to face is recommended to reduce flares.  Discussed the treatment option of BBL/laser.  Typically we recommend 1-3 treatment sessions about 5-8 weeks apart for best results.  The patient's condition may require "maintenance treatments" in the future.  The fee for BBL / laser treatments is $350 per treatment session for the whole face.  A fee can be quoted for other parts of the body. Insurance typically does not pay for BBL/laser treatments and therefore the fee is an out-of-pocket cost.   Spider veins bil legs  Benign, observe  Skin cancer screening  Acrochordons (Skin Tags) -  Fleshy, skin-colored pedunculated papules - Benign appearing.  - Observe. - If desired, they can be removed with an in office procedure that is not covered by insurance. - Please call the clinic if you notice any new or changing lesions.   History of PreCancerous Actinic Keratosis  - site(s) of  PreCancerous Actinic Keratosis clear today. - these may recur and new lesions may form requiring treatment to prevent transformation into skin cancer - observe for new or changing spots and contact [facility] for appointment if occur - photoprotection with sun protective clothing; sunglasses and broad spectrum sunscreen with SPF of at least 30 + and frequent self skin exams recommended - yearly exams by a dermatologist recommended for persons with history of PreCancerous Actinic Keratoses   Family History of Melanoma - Sister  Return in about 1 year (around 09/14/2022) for TBSE, Hx of AKs.  I, Othelia Pulling, RMA, am acting as scribe for Sarina Ser, MD . Documentation: I have reviewed the above documentation for accuracy and completeness, and I agree with the above.  Sarina Ser, MD

## 2021-09-14 NOTE — Patient Instructions (Signed)

## 2021-09-18 ENCOUNTER — Encounter: Payer: Self-pay | Admitting: Dermatology

## 2021-09-20 ENCOUNTER — Telehealth: Payer: Self-pay | Admitting: Nurse Practitioner

## 2021-09-20 DIAGNOSIS — G8929 Other chronic pain: Secondary | ICD-10-CM

## 2021-09-20 NOTE — Telephone Encounter (Signed)
Copied from [place] [id number]. Topic: Referral - Question >> Sep 20, 2021  1:44 PM Pawlus, Brayton Layman A wrote: Reason for CRM: Pt recently saw Jolene for knee pain and was advised to call back in if it has not gotten better, pt stated she thinks she will need a referral to a orthopedist, please advise.

## 2021-09-21 NOTE — Telephone Encounter (Signed)
Patient aware.

## 2021-09-25 ENCOUNTER — Ambulatory Visit: Payer: BLUE CROSS/BLUE SHIELD

## 2021-10-02 ENCOUNTER — Ambulatory Visit: Payer: Medicare Other

## 2021-10-06 ENCOUNTER — Other Ambulatory Visit: Payer: Self-pay

## 2021-10-06 ENCOUNTER — Ambulatory Visit (INDEPENDENT_AMBULATORY_CARE_PROVIDER_SITE_OTHER): Payer: Medicare Other | Admitting: Orthopedic Surgery

## 2021-10-06 ENCOUNTER — Encounter: Payer: Self-pay | Admitting: Orthopedic Surgery

## 2021-10-06 DIAGNOSIS — M1711 Unilateral primary osteoarthritis, right knee: Secondary | ICD-10-CM | POA: Diagnosis not present

## 2021-10-06 NOTE — Progress Notes (Signed)
Office Visit Note   Patient: Cindy Jefferson           Date of Birth: Mar 20, 1956           MRN: [id number] Visit Date: 10/06/2021 Requested by: Venita Lick, NP [address] [place],  [place] [address] PCP: Venita Lick, NP  Subjective: Chief Complaint  Patient presents with   Right Knee - Pain    HPI: Cindy Jefferson is a 66 year old patient with right knee pain 3 months duration.  She did have a fall about 1-1/2 years ago with direct impact on the patella.  She describes swelling weakness giving way as well as popping and catching.  The pain comes and goes in severity.  She feels a constant ache.  Patient will wake occasionally at night from pain.  Takes ibuprofen and Tylenol.  The pain is not severe but relatively constant.  Bending makes it worse.  Standing is okay.  Stairs are difficult.  She does do a lot of sitting and sewing and that can be problematic as well.  There is no limit from the pain on her walking endurance.              ROS: All systems reviewed are negative as they relate to the chief complaint within the history of present illness.  Patient denies  fevers or chills.   Assessment & Plan: Visit Diagnoses:  1. Arthritis of right knee     Plan: Impression is mild right knee arthritis.  Discussed injection today of cortisone as well as gel injections.  I do not think her symptoms rise quite to that level.  Discussed importance of quad strengthening been in a nonloadbearing way.  I think most of her symptoms are patellofemoral in nature as radiographically the medial compartment is worse but this is where she is asymptomatic.  If her symptoms worsen and are not responsive to 5 to 7 days daily of anti-inflammatories then cortisone injection would be indicated we will see her back as needed.  Follow-Up Instructions: Return if symptoms worsen or fail to improve.   Orders:  No orders of the defined types were placed in this encounter.  No orders of the defined types were  placed in this encounter.     Procedures: No procedures performed   Clinical Data: No additional findings.  Objective: Vital Signs: There were no vitals taken for this visit.  Physical Exam:   Constitutional: Patient appears well-developed HEENT:  Head: Normocephalic Eyes:EOM are normal Neck: Normal range of motion Cardiovascular: Normal rate Pulmonary/chest: Effort normal Neurologic: Patient is alert Skin: Skin is warm Psychiatric: Patient has normal mood and affect   Ortho Exam: Ortho exam demonstrates full active and passive range of motion of the right knee.  No groin pain with internal/external rotation of the leg.  Pedal pulses palpable.  No masses lymphadenopathy or skin changes noted in that right knee region.  Collateral and cruciate ligaments are stable.  Mild.  Retinacular tenderness is present but not too much crepitus on the left side.  Slightly more on the right side.  Specialty Comments:  No specialty comments available.  Imaging: No results found.   PMFS History: Patient Active Problem List   Diagnosis Date Noted   Tinnitus aurium, bilateral 09/11/2021   Chronic pain of right knee 09/11/2021   Cherry angioma 12/27/2020   Elevated LDL cholesterol level 09/17/2019   Intractable migraine with aura without status migrainosus 06/03/2019   Overweight (BMI 25.0-29.9) 09/15/2018  At high risk for caregiver role strain 09/15/2018   Acquired hypothyroidism 01/10/2018   GERD without esophagitis 01/10/2018   Osteopenia 01/10/2018   Past Medical History:  Diagnosis Date   Actinic keratosis    GERD (gastroesophageal reflux disease)    Migraine    Migraines    Pancreatitis    Thyroid disease     Family History  Problem Relation Age of Onset   Hyperlipidemia Mother    Heart disease Mother    Lung cancer Mother    Leukemia Father    Arthritis Sister    Thyroid disease Sister    Lupus Daughter    Colon cancer Maternal Grandfather    Kidney cancer  Neg Hx    Breast cancer Neg Hx     Past Surgical History:  Procedure Laterality Date   CHOLECYSTECTOMY     FOOT SURGERY     neck fusion     TOTAL ABDOMINAL HYSTERECTOMY     total, does not get pap smears    Social History   Occupational History   Occupation: retired  Tobacco Use   Smoking status: Never   Smokeless tobacco: Never  Vaping Use   Vaping Use: Never used  Substance and Sexual Activity   Alcohol use: Not Currently   Drug use: Never   Sexual activity: Yes

## 2021-12-25 LAB — HM COLONOSCOPY

## 2022-03-12 ENCOUNTER — Encounter: Payer: Medicare Other | Admitting: Nurse Practitioner

## 2022-03-21 ENCOUNTER — Ambulatory Visit: Payer: BLUE CROSS/BLUE SHIELD | Admitting: Nurse Practitioner

## 2022-03-24 NOTE — Patient Instructions (Signed)

## 2022-03-26 ENCOUNTER — Ambulatory Visit (INDEPENDENT_AMBULATORY_CARE_PROVIDER_SITE_OTHER): Payer: Medicare Other | Admitting: Nurse Practitioner

## 2022-03-26 ENCOUNTER — Encounter: Payer: Self-pay | Admitting: Nurse Practitioner

## 2022-03-26 VITALS — BP 101/69 | Temp 97.7°F | Ht 67.0 in | Wt 171.5 lb

## 2022-03-26 DIAGNOSIS — G43119 Migraine with aura, intractable, without status migrainosus: Secondary | ICD-10-CM | POA: Diagnosis not present

## 2022-03-26 DIAGNOSIS — E039 Hypothyroidism, unspecified: Secondary | ICD-10-CM

## 2022-03-26 DIAGNOSIS — E7800 Pure hypercholesterolemia, unspecified: Secondary | ICD-10-CM

## 2022-03-26 DIAGNOSIS — M25532 Pain in left wrist: Secondary | ICD-10-CM

## 2022-03-26 DIAGNOSIS — Z23 Encounter for immunization: Secondary | ICD-10-CM | POA: Diagnosis not present

## 2022-03-26 DIAGNOSIS — M85851 Other specified disorders of bone density and structure, right thigh: Secondary | ICD-10-CM

## 2022-03-26 DIAGNOSIS — K219 Gastro-esophageal reflux disease without esophagitis: Secondary | ICD-10-CM

## 2022-03-26 DIAGNOSIS — E663 Overweight: Secondary | ICD-10-CM

## 2022-03-26 DIAGNOSIS — Z0000 Encounter for general adult medical examination without abnormal findings: Secondary | ICD-10-CM

## 2022-03-26 MED ORDER — LEVOTHYROXINE SODIUM 75 MCG PO TABS
75.0000 ug | ORAL_TABLET | Freq: Every day | ORAL | 4 refills | Status: DC
Start: 2022-03-26 — End: 2023-04-09

## 2022-03-26 NOTE — Assessment & Plan Note (Signed)
Chronic, stable at this time with Nurtec only -- has not taken Ajovy since October.  Continue current medication regimen as prescribed by neurology and collaboration.  She has history of pancreatitis with medications used for migraines, will need to be cautious with treatment.  Return in 6 months.

## 2022-03-26 NOTE — Assessment & Plan Note (Addendum)
Chronic, last DEXA August 2021.  Continue Vitamin D, may need to utilize higher weekly doses in future.  Check level today.  Consider bisphosphonate if osteoporosis presents -- if poorly tolerated consider injectable.  Dexa ordered for August 2023.

## 2022-03-26 NOTE — Assessment & Plan Note (Signed)
Chronic, ongoing.  Continue current medication regimen and adjust dose as needed. Mag level today.

## 2022-03-26 NOTE — Progress Notes (Signed)
BP 101/69   Temp 97.7 F (36.5 C) (Oral)   Ht '5\' 7"'$  (1.702 m)   Wt 171 lb 8 oz (77.8 kg)   SpO2 96%   BMI 26.86 kg/m    Subjective:    Patient ID: Cindy Jefferson, female    DOB: [birth year], 66 y.o.   MRN: [id number]  HPI: [name] is a 66 y.o. female presenting on 03/26/2022 for comprehensive medical examination. Current medical complaints include:none  She currently lives with: husband and mother-in-law Menopausal Symptoms: no   Diagnosis of migraines and followed by neurology with last visit 10/03/21-- she ran out of [place] in October and has not taken since with only 3 migraines since that time.  When she had those 3 she took Nurtec.  HYPOTHYROIDISM Continues on Levothyroxine 75 MCG.  Was told in past she had Hashimoto's. Thyroid control status:stable Satisfied with current treatment? yes Medication side effects: no Medication compliance: good compliance Etiology of hypothyroidism:  Recent dose adjustment:no Fatigue: no Cold intolerance: no Heat intolerance: no Weight gain: no Weight loss: no Constipation: no Diarrhea/loose stools: no Palpitations: no Lower extremity edema: no Anxiety/depressed mood: no    OSTEOPENIA Last DEXA 04/14/2020 with hip T score -2.4 and femur -2.2 -- some improvement noted.  No current bisphosphonate -- she had been on something in past.  Continues on Vitamin D daily Satisfied with current treatment?: yes Medication side effects: no Medication compliance: good compliance Past osteoporosis medications/treatments: calcium and Vit D Adequate calcium & vitamin D: yes Weight bearing exercises: yes    HYPERLIPIDEMIA No current medications.  Diet focused.   Hyperlipidemia status: good compliance Supplements: none Aspirin:  no The 10-year ASCVD risk score (Arnett DK, et al., 2019) is: 3%   Values used to calculate the score:     Age: 46 years     Sex: Female     Is Non-Hispanic African American: No     Diabetic: No     Tobacco  smoker: No     Systolic Blood Pressure: 622 mmHg     Is BP treated: No     HDL Cholesterol: 95 mg/dL     Total Cholesterol: 233 mg/dL Chest pain:  no Coronary artery disease:  no Family history CAD:  yes Family history early CAD:  no   GERD Continues on Pepcid as needed, does not take very often.  Had EGD on 11/28/21 and had stretching esophagus. GERD control status: stable  Satisfied with current treatment? yes Heartburn frequency: minimal Medication side effects: no  Medication compliance: stable Previous GERD medications: Antacid use frequency:  none Dysphagia: no Odynophagia:  no Hematemesis: no Blood in stool: no EGD: yes  WRIST PAIN  To left wrist off and on for two months with worsening.  She is right handed. Duration: months Involved wrist: left Mechanism of injury:  no trauma Location: dorsal Onset: gradual Severity: 4/10  Quality:  sharp, aching, and throbbing Frequency: intermittent Radiation: no Aggravating factors: movement and gripping  Alleviating factors: rest  Status: fluctuating Treatments attempted: none    Relief with NSAIDs?:  No NSAIDs Taken Weakness: no Numbness: none Redness: no Bruising: no Swelling: no Fevers: no   Depression Screen done today and results listed below:     03/26/2022   10:54 AM 09/11/2021   10:12 AM 03/10/2021    9:59 AM 02/29/2020    8:10 AM 01/28/2019    4:47 PM  Depression screen PHQ 2/9  Decreased Interest 0 0 0 0 0  Down, Depressed, Hopeless 0 0 0 0 0  PHQ - 2 Score 0 0 0 0 0  Altered sleeping 0 0   0  Tired, decreased energy 0 0   0  Change in appetite 0 0   0  Feeling bad or failure about yourself  0 0   0  Trouble concentrating 0 0   0  Moving slowly or fidgety/restless 0 0   0  Suicidal thoughts 0 0   0  PHQ-9 Score 0 0   0  Difficult doing work/chores Not difficult at all    Not difficult at all    The patient does not have a history of falls. I did not complete a risk assessment for falls. A plan of  care for falls was not documented.  Functional Status Survey: Is the patient deaf or have difficulty hearing?: No Does the patient have difficulty seeing, even when wearing glasses/contacts?: No Does the patient have difficulty concentrating, remembering, or making decisions?: No Does the patient have difficulty walking or climbing stairs?: No Does the patient have difficulty dressing or bathing?: No Does the patient have difficulty doing errands alone such as visiting a doctor's office or shopping?: No    Past Medical History:  Past Medical History:  Diagnosis Date   Actinic keratosis    GERD (gastroesophageal reflux disease)    Migraine    Migraines    Pancreatitis    Thyroid disease     Surgical History:  Past Surgical History:  Procedure Laterality Date   CHOLECYSTECTOMY     FOOT SURGERY     neck fusion     TOTAL ABDOMINAL HYSTERECTOMY     total, does not get pap smears     Medications:  Current Outpatient Medications on File Prior to Visit  Medication Sig   Calcium-Cholecalciferol 200-250 MG-UNIT TABS Take by mouth.   Cholecalciferol (VITAMIN D3) 2000 units capsule Take by mouth.   diclofenac sodium (VOLTAREN) 1 % GEL Apply 2 g topically as needed (four times a day as needed for neck pain).   famotidine (PEPCID) 20 MG tablet TAKE 1 TABLET (20 MG TOTAL) BY MOUTH ONCE DAILY DISCONTINUE ZANTAC   mometasone (NASONEX) 50 MCG/ACT nasal spray Place into the nose.   nitrofurantoin, macrocrystal-monohydrate, (MACROBID) 100 MG capsule Take 1 capsule (100 mg total) by mouth as needed (one capsule as needed after intercourse for UTI prevention).   NURTEC 75 MG TBDP TAKE 75 MG BY MOUTH ONCE DAILY AS NEEDED   ondansetron (ZOFRAN) 8 MG tablet Take 1 tablet (8 mg total) by mouth every 8 (eight) hours as needed for nausea or vomiting.   No current facility-administered medications on file prior to visit.    Allergies:  Allergies  Allergen Reactions   Valproic Acid Other (See  Comments)    Pancreatitis    Erythromycin Nausea Only   Tape Other (See Comments)    Skin Irritation      Social History:  Social History   Socioeconomic History   Marital status: Married    Spouse name: Not on file   Number of children: 2   Years of education: Not on file   Highest education level: Not on file  Occupational History   Occupation: retired  Tobacco Use   Smoking status: Never   Smokeless tobacco: Never  Vaping Use   Vaping Use: Never used  Substance and Sexual Activity   Alcohol use: Not Currently   Drug use: Never   Sexual activity:  Yes  Other Topics Concern   Not on file  Social History Narrative   Not on file   Social Determinants of Health   Financial Resource Strain: Low Risk  (09/15/2018)   Overall Financial Resource Strain (CARDIA)    Difficulty of Paying Living Expenses: Not hard at all  Food Insecurity: No Food Insecurity (09/15/2018)   Hunger Vital Sign    Worried About Running Out of Food in the Last Year: Never true    Ran Out of Food in the Last Year: Never true  Transportation Needs: No Transportation Needs (09/15/2018)   PRAPARE - Hydrologist (Medical): No    Lack of Transportation (Non-Medical): No  Physical Activity: Insufficiently Active (09/15/2018)   Exercise Vital Sign    Days of Exercise per Week: 4 days    Minutes of Exercise per Session: 30 min  Stress: Stress Concern Present (09/15/2018)   [place]    Feeling of Stress : To some extent  Social Connections: Somewhat Isolated (09/15/2018)   Social Connection and Isolation Panel [NHANES]    Frequency of Communication with Friends and Family: More than three times a week    Frequency of Social Gatherings with Friends and Family: More than three times a week    Attends Religious Services: Never    Marine scientist or Organizations: No    Attends Archivist Meetings:  Never    Marital Status: Married  Human resources officer Violence: Not At Risk (09/15/2018)   Humiliation, Afraid, Rape, and Kick questionnaire    Fear of Current or Ex-Partner: No    Emotionally Abused: No    Physically Abused: No    Sexually Abused: No   Social History   Tobacco Use  Smoking Status Never  Smokeless Tobacco Never   Social History   Substance and Sexual Activity  Alcohol Use Not Currently    Family History:  Family History  Problem Relation Age of Onset   Hyperlipidemia Mother    Heart disease Mother    Lung cancer Mother    Leukemia Father    Arthritis Sister    Thyroid disease Sister    Lupus Daughter    Colon cancer Maternal Grandfather    Kidney cancer Neg Hx    Breast cancer Neg Hx     Past medical history, surgical history, medications, allergies, family history and social history reviewed with patient today and changes made to appropriate areas of the chart.   Review of Systems - negative All other ROS negative except what is listed above and in the HPI.      Objective:    BP 101/69   Temp 97.7 F (36.5 C) (Oral)   Ht '5\' 7"'$  (1.702 m)   Wt 171 lb 8 oz (77.8 kg)   SpO2 96%   BMI 26.86 kg/m   Wt Readings from Last 3 Encounters:  03/26/22 171 lb 8 oz (77.8 kg)  09/11/21 167 lb 9.6 oz (76 kg)  06/27/21 167 lb (75.8 kg)    Physical Exam Vitals and nursing note reviewed. Exam conducted with a chaperone present.  Constitutional:      General: She is awake. She is not in acute distress.    Appearance: She is well-developed and well-groomed. She is not ill-appearing or toxic-appearing.  HENT:     Head: Normocephalic and atraumatic.     Right Ear: Hearing, tympanic membrane, ear canal and external ear  normal. No drainage.     Left Ear: Hearing, tympanic membrane, ear canal and external ear normal. No drainage.     Nose: Nose normal.     Right Sinus: No maxillary sinus tenderness or frontal sinus tenderness.     Left Sinus: No maxillary sinus  tenderness or frontal sinus tenderness.     Mouth/Throat:     Mouth: Mucous membranes are moist.     Pharynx: Oropharynx is clear. Uvula midline. No pharyngeal swelling, oropharyngeal exudate or posterior oropharyngeal erythema.  Eyes:     General: Lids are normal.        Right eye: No discharge.        Left eye: No discharge.     Extraocular Movements: Extraocular movements intact.     Conjunctiva/sclera: Conjunctivae normal.     Pupils: Pupils are equal, round, and reactive to light.     Visual Fields: Right eye visual fields normal and left eye visual fields normal.  Neck:     Thyroid: No thyromegaly.     Vascular: No carotid bruit.     Trachea: Trachea normal.  Cardiovascular:     Rate and Rhythm: Normal rate and regular rhythm.     Heart sounds: Normal heart sounds. No murmur heard.    No gallop.  Pulmonary:     Effort: Pulmonary effort is normal. No accessory muscle usage or respiratory distress.     Breath sounds: Normal breath sounds.  Chest:  Breasts:    Right: Normal.     Left: Normal.  Abdominal:     General: Bowel sounds are normal.     Palpations: Abdomen is soft. There is no hepatomegaly or splenomegaly.     Tenderness: There is no abdominal tenderness.  Musculoskeletal:        General: Normal range of motion.     Right wrist: Tenderness (along extensor line) present. No bony tenderness, snuff box tenderness or crepitus. Normal range of motion.     Left wrist: Normal.     Cervical back: Normal range of motion and neck supple.     Right lower leg: No edema.     Left lower leg: No edema.  Lymphadenopathy:     Head:     Right side of head: No submental, submandibular, tonsillar, preauricular or posterior auricular adenopathy.     Left side of head: No submental, submandibular, tonsillar, preauricular or posterior auricular adenopathy.     Cervical: No cervical adenopathy.     Upper Body:     Right upper body: No supraclavicular, axillary or pectoral adenopathy.      Left upper body: No supraclavicular, axillary or pectoral adenopathy.  Skin:    General: Skin is warm and dry.     Capillary Refill: Capillary refill takes less than 2 seconds.     Findings: No rash.  Neurological:     Mental Status: She is alert and oriented to person, place, and time.     Gait: Gait is intact.     Deep Tendon Reflexes: Reflexes are normal and symmetric.     Reflex Scores:      Brachioradialis reflexes are 2+ on the right side and 2+ on the left side.      Patellar reflexes are 2+ on the right side and 2+ on the left side. Psychiatric:        Attention and Perception: Attention normal.        Mood and Affect: Mood normal.  Speech: Speech normal.        Behavior: Behavior normal. Behavior is cooperative.        Thought Content: Thought content normal.        Judgment: Judgment normal.    Results for orders placed or performed in visit on 09/11/21  TSH  Result Value Ref Range   TSH 0.694 0.450 - 4.500 uIU/mL  T4, free  Result Value Ref Range   Free T4 1.88 (H) 0.82 - 1.77 ng/dL      Assessment & Plan:   Problem List Items Addressed This Visit       Cardiovascular and Mediastinum   Intractable migraine with aura without status migrainosus - Primary    Chronic, stable at this time with Nurtec only -- has not taken [place] since October.  Continue current medication regimen as prescribed by neurology and collaboration.  She has history of pancreatitis with medications used for migraines, will need to be cautious with treatment.  Return in 6 months.       Relevant Orders   CBC with Differential/Platelet     Digestive   GERD without esophagitis    Chronic, ongoing.  Continue current medication regimen and adjust dose as needed. Mag level today.      Relevant Orders   Magnesium     Endocrine   Acquired hypothyroidism    Chronic, stable with TSH within normal range.  Continue current regimen and adjust as needed.  TSH, Free T4 today on labs.    Return in 6 months.       Relevant Medications   levothyroxine (SYNTHROID) 75 MCG tablet   Other Relevant Orders   TSH   T4, free     Musculoskeletal and Integument   Osteopenia    Chronic, last DEXA August 2021.  Continue Vitamin D, may need to utilize higher weekly doses in future.  Check level today.  Consider bisphosphonate if osteoporosis presents -- if poorly tolerated consider injectable.  Dexa ordered for August 2023.      Relevant Orders   VITAMIN D 25 Hydroxy (Vit-D Deficiency, Fractures)   DG Bone Density     Other   Elevated LDL cholesterol level    Ongoing, with ASCVD 3%.  Continue focus on diet regimen, discussed at length with her.  Poor tolerance to fish oil due to GERD.   Return in 6 months.  Check lipid panel today.      Relevant Orders   Comprehensive metabolic panel   Lipid Panel w/o Chol/HDL Ratio   Left wrist pain    Acute, suspect more tendon related inflammation.  Recommend use of heat/ice as needed + Voltaren gel to massage into area.  Wear wrist support when utilizing wrist during activities.  Return to office if worsening or ongoing.      Overweight (BMI 25.0-29.9)    BMI 26.86.  Recommended eating smaller high protein, low fat meals more frequently and exercising 30 mins a day 5 times a week with a goal of 10-15lb weight loss in the next 3 months. Patient voiced their understanding and motivation to adhere to these recommendations.       Other Visit Diagnoses     Pneumococcal vaccination given       PCV13 in office today.   Relevant Orders   Pneumococcal conjugate vaccine 13-valent (Completed)   Encounter for annual physical exam        Annual physical today with labs and health maintenance reviewed, discussed with patient.  Relevant Orders   CBC with Differential/Platelet        Follow up plan: Return in about 6 months (around 09/26/2022) for THYROID., MIGRAINES, OSTEOPENIA.   LABORATORY TESTING:  - Pap smear: not  applicable  IMMUNIZATIONS:   - Tdap: Tetanus vaccination status reviewed: last tetanus booster within 10 years. - Influenza: Up to date - Pneumovax: Not applicable  - Prevnar: Given today - HPV: Not applicable - Zostavax vaccine: Up to date --- 2018  SCREENING: -Mammogram: Up to date  - Colonoscopy: Up to date  - Bone Density: Up To Date  -Hearing Test: Not applicable  -Spirometry: Not applicable   PATIENT COUNSELING:   Advised to take 1 mg of folate supplement per day if capable of pregnancy.   Sexuality: Discussed sexually transmitted diseases, partner selection, use of condoms, avoidance of unintended pregnancy  and contraceptive alternatives.   Advised to avoid cigarette smoking.  I discussed with the patient that most people either abstain from alcohol or drink within safe limits (<=14/week and <=4 drinks/occasion for males, <=7/weeks and <= 3 drinks/occasion for females) and that the risk for alcohol disorders and other health effects rises proportionally with the number of drinks per week and how often a drinker exceeds daily limits.  Discussed cessation/primary prevention of drug use and availability of treatment for abuse.   Diet: Encouraged to adjust caloric intake to maintain  or achieve ideal body weight, to reduce intake of dietary saturated fat and total fat, to limit sodium intake by avoiding high sodium foods and not adding table salt, and to maintain adequate dietary potassium and calcium preferably from fresh fruits, vegetables, and low-fat dairy products.    Stressed the importance of regular exercise  Injury prevention: Discussed safety belts, safety helmets, smoke detector, smoking near bedding or upholstery.   Dental health: Discussed importance of regular tooth brushing, flossing, and dental visits.    NEXT PREVENTATIVE PHYSICAL DUE IN 1 YEAR. Return in about 6 months (around 09/26/2022) for THYROID., MIGRAINES, OSTEOPENIA.

## 2022-03-26 NOTE — Assessment & Plan Note (Signed)
Ongoing, with ASCVD 3%.  Continue focus on diet regimen, discussed at length with her.  Poor tolerance to fish oil due to GERD.   Return in 6 months.  Check lipid panel today.

## 2022-03-26 NOTE — Assessment & Plan Note (Signed)
Chronic, stable with TSH within normal range.  Continue current regimen and adjust as needed.  TSH, Free T4 today on labs.   Return in 6 months.

## 2022-03-26 NOTE — Assessment & Plan Note (Signed)
Acute, suspect more tendon related inflammation.  Recommend use of heat/ice as needed + Voltaren gel to massage into area.  Wear wrist support when utilizing wrist during activities.  Return to office if worsening or ongoing.

## 2022-03-26 NOTE — Assessment & Plan Note (Signed)
BMI 26.86.  Recommended eating smaller high protein, low fat meals more frequently and exercising 30 mins a day 5 times a week with a goal of 10-15lb weight loss in the next 3 months. Patient voiced their understanding and motivation to adhere to these recommendations.

## 2022-03-27 LAB — CBC WITH DIFFERENTIAL/PLATELET
Basophils Absolute: 0.1 10*3/uL (ref 0.0–0.2)
Basos: 1 %
EOS (ABSOLUTE): 0.2 10*3/uL (ref 0.0–0.4)
Eos: 4 %
Hematocrit: 45.3 % (ref 34.0–46.6)
Hemoglobin: 14.7 g/dL (ref 11.1–15.9)
Immature Grans (Abs): 0 10*3/uL (ref 0.0–0.1)
Immature Granulocytes: 0 %
Lymphocytes Absolute: 1.7 10*3/uL (ref 0.7–3.1)
Lymphs: 32 %
MCH: 28.4 pg (ref 26.6–33.0)
MCHC: 32.5 g/dL (ref 31.5–35.7)
MCV: 88 fL (ref 79–97)
Monocytes Absolute: 0.4 10*3/uL (ref 0.1–0.9)
Monocytes: 7 %
Neutrophils Absolute: 2.9 10*3/uL (ref 1.4–7.0)
Neutrophils: 56 %
Platelets: 275 10*3/uL (ref 150–450)
RBC: 5.17 x10E6/uL (ref 3.77–5.28)
RDW: 12.1 % (ref 11.7–15.4)
WBC: 5.2 10*3/uL (ref 3.4–10.8)

## 2022-03-27 LAB — COMPREHENSIVE METABOLIC PANEL
ALT: 24 IU/L (ref 0–32)
AST: 32 IU/L (ref 0–40)
Albumin/Globulin Ratio: 1.9 (ref 1.2–2.2)
Albumin: 4.6 g/dL (ref 3.9–4.9)
Alkaline Phosphatase: 131 IU/L — ABNORMAL HIGH (ref 44–121)
BUN/Creatinine Ratio: 12 (ref 12–28)
BUN: 10 mg/dL (ref 8–27)
Bilirubin Total: 0.3 mg/dL (ref 0.0–1.2)
CO2: 25 mmol/L (ref 20–29)
Calcium: 9.9 mg/dL (ref 8.7–10.3)
Chloride: 100 mmol/L (ref 96–106)
Creatinine, Ser: 0.85 mg/dL (ref 0.57–1.00)
Globulin, Total: 2.4 g/dL (ref 1.5–4.5)
Glucose: 90 mg/dL (ref 70–99)
Potassium: 4.3 mmol/L (ref 3.5–5.2)
Sodium: 141 mmol/L (ref 134–144)
Total Protein: 7 g/dL (ref 6.0–8.5)
eGFR: 76 mL/min/{1.73_m2} (ref >59)

## 2022-03-27 LAB — LIPID PANEL W/O CHOL/HDL RATIO
Cholesterol, Total: 224 mg/dL — ABNORMAL HIGH (ref 100–199)
HDL: 75 mg/dL (ref >39)
LDL Chol Calc (NIH): 127 mg/dL — ABNORMAL HIGH (ref 0–99)
Triglycerides: 129 mg/dL (ref 0–149)
VLDL Cholesterol Cal: 22 mg/dL (ref 5–40)

## 2022-03-27 LAB — TSH: TSH: 0.914 u[IU]/mL (ref 0.450–4.500)

## 2022-03-27 LAB — VITAMIN D 25 HYDROXY (VIT D DEFICIENCY, FRACTURES): Vit D, 25-Hydroxy: 37.9 ng/mL (ref 30.0–100.0)

## 2022-03-27 LAB — T4, FREE: Free T4: 1.49 ng/dL (ref 0.82–1.77)

## 2022-03-27 LAB — MAGNESIUM: Magnesium: 2.1 mg/dL (ref 1.6–2.3)

## 2022-03-27 NOTE — Progress Notes (Signed)
Contacted via MyChart The 10-year ASCVD risk score (Arnett DK, et al., 2019) is: 3.3%   Values used to calculate the score:     Age: 66 years     Sex: Female     Is Non-Hispanic African American: No     Diabetic: No     Tobacco smoker: No     Systolic Blood Pressure: 045 mmHg     Is BP treated: No     HDL Cholesterol: 75 mg/dL     Total Cholesterol: 224 mg/dL   Good afternoon Juliann Pulse, as we discussed in office today your labs have returned. Kidney function, creatinine and eGFR, remains normal, as is liver function, AST and ALT.  Alkaline phosphatase is mildly elevated, as we discussed, and we will continue to monitor this.  Thyroid labs stable, continue current Levothyroxine dosing.  Vitamin D level is normal and CBC shows no anemia or infection.  Cholesterol levels continue to show elevation -- but that score I educated you on is 3.3%, still below 10%.  Keep that focus on diet and exercise.  Any questions? Keep being amazing!!  Thank you for allowing me to participate in your care.  I appreciate you. Kindest regards, Tobi Leinweber

## 2022-03-31 NOTE — Addendum Note (Signed)
Addended by: Marnee Guarneri T on: 03/31/2022 10:52 AM   Modules accepted: Level of Service

## 2022-04-14 NOTE — Patient Instructions (Signed)
Osteopenia  Osteopenia is a loss of thickness (density) inside the bones. Another name for osteopenia is low bone mass. Mild osteopenia is a normal part of aging. It is not a disease, and it does not cause symptoms. However, if you have osteopenia and continue to lose bone mass, you could develop a condition that causes the bones to become thin and break more easily (osteoporosis). Osteoporosis can cause you to lose some height, have back pain, and have a stooped posture. Although osteopenia is not a disease, making changes to your lifestyle and diet can help to prevent osteopenia from developing into osteoporosis. What are the causes? Osteopenia is caused by loss of calcium in the bones. Bones are constantly changing. Old bone cells are continually being replaced with new bone cells. This process builds new bone. The mineral calcium is needed to build new bone and maintain bone density. Bone density is usually highest around age 35. After that, most people's bodies cannot replace all the bone they have lost with new bone. What increases the risk? You are more likely to develop this condition if: You are older than age 50. You are a woman who went through menopause early. You have a long illness that keeps you in bed. You do not get enough exercise. You lack certain nutrients (malnutrition). You have an overactive thyroid gland (hyperthyroidism). You use products that contain nicotine or tobacco, such as cigarettes, e-cigarettes and chewing tobacco, or you drink a lot of alcohol. You are taking medicines that weaken the bones, such as steroids. What are the signs or symptoms? This condition does not cause any symptoms. You may have a slightly higher risk for bone breaks (fractures), so getting fractures more easily than normal may be an indication of osteopenia. How is this diagnosed? This condition may be diagnosed based on an X-ray exam that measures bone density (dual-energy X-ray  absorptiometry, or DEXA). This test can measure bone density in your hips, spine, and wrists. Osteopenia has no symptoms, so this condition is usually diagnosed after a routine bone density screening test is done for osteoporosis. This routine screening is usually done for: Women who are age 65 or older. Men who are age 70 or older. If you have risk factors for osteopenia, you may have the screening test at an earlier age. How is this treated? Making dietary and lifestyle changes can lower your risk for osteoporosis. If you have severe osteopenia that is close to becoming osteoporosis, this condition can be treated with medicines and dietary supplements such as calcium and vitamin D. These supplements help to rebuild bone density. Follow these instructions at home: Eating and drinking Eat a diet that is high in calcium and vitamin D. Calcium is found in dairy products, beans, salmon, and leafy green vegetables like spinach and broccoli. Look for foods that have vitamin D and calcium added to them (fortified foods), such as orange juice, cereal, and bread.  Lifestyle Do 30 minutes or more of a weight-bearing exercise every day, such as walking, jogging, or playing a sport. These types of exercises strengthen the bones. Do not use any products that contain nicotine or tobacco, such as cigarettes, e-cigarettes, and chewing tobacco. If you need help quitting, ask your health care provider. Do not drink alcohol if: Your health care provider tells you not to drink. You are pregnant, may be pregnant, or are planning to become pregnant. If you drink alcohol: Limit how much you use to: 0-1 drink a day for women. 0-2   drinks a day for men. Be aware of how much alcohol is in your drink. In the [place] one drink equals one 12 oz bottle of beer (355 mL), one 5 oz glass of wine (148 mL), or one 1 oz glass of hard liquor (44 mL). General instructions Take over-the-counter and prescription medicines only as  told by your health care provider. These include vitamins and supplements. Take precautions at home to lower your risk of falling, such as: Keeping rooms well-lit and free of clutter, such as cords. Installing safety rails on stairs. Using rubber mats in the bathroom or other areas that are often wet or slippery. Keep all follow-up visits. This is important. Contact a health care provider if: You have not had a bone density screening for osteoporosis and you are: A woman who is age 65 or older. A man who is age 70 or older. You are a postmenopausal woman who has not had a bone density screening for osteoporosis. You are older than age 50 and you want to know if you should have bone density screening for osteoporosis. Summary Osteopenia is a loss of thickness (density) inside the bones. Another name for osteopenia is low bone mass. Osteopenia is not a disease, but it may increase your risk for a condition that causes the bones to become thin and break more easily (osteoporosis). You may be at risk for osteopenia if you are older than age 50 or if you are a woman who went through early menopause. Osteopenia does not cause any symptoms, but it can be diagnosed with a bone density screening test. Dietary and lifestyle changes are the first treatment for osteopenia. These may lower your risk for osteoporosis. This information is not intended to replace advice given to you by your health care provider. Make sure you discuss any questions you have with your health care provider. Document Revised: 02/04/2020 Document Reviewed: 02/04/2020 Elsevier Patient Education  2023 [employer] Inc.  

## 2022-04-16 ENCOUNTER — Encounter: Payer: Self-pay | Admitting: Nurse Practitioner

## 2022-04-16 ENCOUNTER — Ambulatory Visit (INDEPENDENT_AMBULATORY_CARE_PROVIDER_SITE_OTHER): Payer: Medicare Other | Admitting: Nurse Practitioner

## 2022-04-16 VITALS — BP 115/76 | HR 72 | Temp 98.0°F | Wt 170.9 lb

## 2022-04-16 DIAGNOSIS — Z1231 Encounter for screening mammogram for malignant neoplasm of breast: Secondary | ICD-10-CM | POA: Diagnosis not present

## 2022-04-16 DIAGNOSIS — Z0000 Encounter for general adult medical examination without abnormal findings: Secondary | ICD-10-CM | POA: Diagnosis not present

## 2022-04-16 NOTE — Progress Notes (Signed)
BP 115/76   Pulse 72   Temp 98 F (36.7 C) (Oral)   Wt 170 lb 14.4 oz (77.5 kg)   SpO2 98%   BMI 26.77 kg/m    Subjective:    Patient ID: Cindy Jefferson, female    DOB: [birth year], 66 y.o.   MRN: [id number]  HPI: Brieonna Crutcher is a 66 y.o. female presenting on 04/16/2022 for Welcome to Medicare.  She currently lives with: husband Menopausal Symptoms: no     04/16/2022    3:25 PM 03/26/2022   10:54 AM 09/11/2021   10:12 AM 03/10/2021    9:59 AM 02/29/2020    8:10 AM  Depression screen PHQ 2/9  Decreased Interest 0 0 0 0 0  Down, Depressed, Hopeless 0 0 0 0 0  PHQ - 2 Score 0 0 0 0 0  Altered sleeping 0 0 0    Tired, decreased energy 0 0 0    Change in appetite 0 0 0    Feeling bad or failure about yourself  0 0 0    Trouble concentrating 0 0 0    Moving slowly or fidgety/restless 0 0 0    Suicidal thoughts 0 0 0    PHQ-9 Score 0 0 0    Difficult doing work/chores Not difficult at all Not difficult at all           02/29/2020    8:10 AM 09/11/2021   10:11 AM 03/26/2022   10:54 AM 04/16/2022    3:25 PM 04/16/2022    3:49 PM  Fall Risk  Falls in the past year? 0 0 0 0 0  Was there an injury with Fall? 0 0 0 0 0  Fall Risk Category Calculator 0 0 0 0 0  Fall Risk Category _0   Patient Fall Risk Level _1   Patient at Risk for Falls Due to  No Fall Risks No Fall Risks No Fall Risks No Fall Risks  Fall risk Follow up _2     Functional Status Survey: Is the patient deaf or have difficulty hearing?: No Does the patient have difficulty seeing, even when wearing glasses/contacts?: No Does the patient have difficulty concentrating, remembering, or making decisions?: No Does the patient have difficulty walking or climbing stairs?: No Does the patient have difficulty  dressing or bathing?: No Does the patient have difficulty doing errands alone such as visiting a doctor's office or shopping?: No    Past Medical History:  Past Medical History:  Diagnosis Date   Actinic keratosis    GERD (gastroesophageal reflux disease)    Migraine    Migraines    Pancreatitis    Thyroid disease     Surgical History:  Past Surgical History:  Procedure Laterality Date   CHOLECYSTECTOMY     FOOT SURGERY     neck fusion     TOTAL ABDOMINAL HYSTERECTOMY     total, does not get pap smears     Medications:  Current Outpatient Medications on File Prior to Visit  Medication Sig   Calcium-Cholecalciferol 200-250 MG-UNIT TABS Take by mouth.   Cholecalciferol (VITAMIN D3) 2000 units capsule Take by mouth.   diclofenac sodium (VOLTAREN) 1 % GEL Apply 2 g topically as needed (four times a day as needed for neck pain).   famotidine (  PEPCID) 20 MG tablet TAKE 1 TABLET (20 MG TOTAL) BY MOUTH ONCE DAILY DISCONTINUE ZANTAC   levothyroxine (SYNTHROID) 75 MCG tablet Take 1 tablet (75 mcg total) by mouth daily.   mometasone (NASONEX) 50 MCG/ACT nasal spray Place into the nose.   nitrofurantoin, macrocrystal-monohydrate, (MACROBID) 100 MG capsule Take 1 capsule (100 mg total) by mouth as needed (one capsule as needed after intercourse for UTI prevention).   NURTEC 75 MG TBDP TAKE 75 MG BY MOUTH ONCE DAILY AS NEEDED   ondansetron (ZOFRAN) 8 MG tablet Take 1 tablet (8 mg total) by mouth every 8 (eight) hours as needed for nausea or vomiting.   No current facility-administered medications on file prior to visit.    Allergies:  Allergies  Allergen Reactions   Valproic Acid Other (See Comments)    Pancreatitis    Erythromycin Nausea Only   Tape Other (See Comments)    Skin Irritation      Social History:  Social History   Socioeconomic History   Marital status: Married    Spouse name: Not on file   Number of children: 2   Years of education: Not on file   Highest  education level: Not on file  Occupational History   Occupation: retired  Tobacco Use   Smoking status: Never   Smokeless tobacco: Never  Vaping Use   Vaping Use: Never used  Substance and Sexual Activity   Alcohol use: Not Currently   Drug use: Never   Sexual activity: Yes  Other Topics Concern   Not on file  Social History Narrative   Not on file   Social Determinants of Health   Financial Resource Strain: Low Risk  (09/15/2018)   Overall Financial Resource Strain (CARDIA)    Difficulty of Paying Living Expenses: Not hard at all  Food Insecurity: No Food Insecurity (09/15/2018)   Hunger Vital Sign    Worried About Running Out of Food in the Last Year: Never true    [place] in the Last Year: Never true  Transportation Needs: No Transportation Needs (09/15/2018)   PRAPARE - Hydrologist (Medical): No    Lack of Transportation (Non-Medical): No  Physical Activity: Insufficiently Active (09/15/2018)   Exercise Vital Sign    Days of Exercise per Week: 4 days    Minutes of Exercise per Session: 30 min  Stress: Stress Concern Present (09/15/2018)   [place]    Feeling of Stress : To some extent  Social Connections: Somewhat Isolated (09/15/2018)   Social Connection and Isolation Panel [NHANES]    Frequency of Communication with Friends and Family: More than three times a week    Frequency of Social Gatherings with Friends and Family: More than three times a week    Attends Religious Services: Never    Marine scientist or Organizations: No    Attends Archivist Meetings: Never    Marital Status: Married  Human resources officer Violence: Not At Risk (09/15/2018)   Humiliation, Afraid, Rape, and Kick questionnaire    Fear of Current or Ex-Partner: No    Emotionally Abused: No    Physically Abused: No    Sexually Abused: No   Social History   Tobacco Use  Smoking  Status Never  Smokeless Tobacco Never   Social History   Substance and Sexual Activity  Alcohol Use Not Currently    Family History:  Family History  Problem Relation Age of Onset   Hyperlipidemia Mother    Heart disease Mother    Lung cancer Mother    Leukemia Father    Arthritis Sister    Thyroid disease Sister    Lupus Daughter    Colon cancer Maternal Grandfather    Kidney cancer Neg Hx    Breast cancer Neg Hx     Past medical history, surgical history, medications, allergies, family history and social history reviewed with patient today and changes made to appropriate areas of the chart.   ROS All other ROS negative except what is listed above and in the HPI.      Objective:    BP 115/76   Pulse 72   Temp 98 F (36.7 C) (Oral)   Wt 170 lb 14.4 oz (77.5 kg)   SpO2 98%   BMI 26.77 kg/m   Wt Readings from Last 3 Encounters:  04/16/22 170 lb 14.4 oz (77.5 kg)  03/26/22 171 lb 8 oz (77.8 kg)  09/11/21 167 lb 9.6 oz (76 kg)    Physical Exam Vitals and nursing note reviewed.  Constitutional:      General: She is awake. She is not in acute distress.    Appearance: She is well-developed, well-groomed and overweight. She is not ill-appearing or toxic-appearing.  HENT:     Head: Normocephalic and atraumatic.     Right Ear: Hearing, tympanic membrane, ear canal and external ear normal. No drainage.     Left Ear: Hearing, tympanic membrane, ear canal and external ear normal. No drainage.     Nose: Nose normal.     Right Sinus: No maxillary sinus tenderness or frontal sinus tenderness.     Left Sinus: No maxillary sinus tenderness or frontal sinus tenderness.     Mouth/Throat:     Mouth: Mucous membranes are moist.     Pharynx: Oropharynx is clear. Uvula midline. No pharyngeal swelling, oropharyngeal exudate or posterior oropharyngeal erythema.  Eyes:     General: Lids are normal.        Right eye: No discharge.        Left eye: No discharge.     Extraocular  Movements: Extraocular movements intact.     Conjunctiva/sclera: Conjunctivae normal.     Pupils: Pupils are equal, round, and reactive to light.     Visual Fields: Right eye visual fields normal and left eye visual fields normal.  Neck:     Thyroid: No thyromegaly.     Vascular: No carotid bruit.     Trachea: Trachea normal.  Cardiovascular:     Rate and Rhythm: Normal rate and regular rhythm.     Heart sounds: Normal heart sounds. No murmur heard.    No gallop.  Pulmonary:     Effort: Pulmonary effort is normal. No accessory muscle usage or respiratory distress.     Breath sounds: Normal breath sounds.  Abdominal:     General: Bowel sounds are normal.     Palpations: Abdomen is soft. There is no hepatomegaly or splenomegaly.     Tenderness: There is no abdominal tenderness.  Musculoskeletal:        General: Normal range of motion.     Cervical back: Normal range of motion and neck supple.     Right lower leg: No edema.     Left lower leg: No edema.  Lymphadenopathy:     Head:     Right side of head: No submental, submandibular, tonsillar, preauricular or posterior auricular  adenopathy.     Left side of head: No submental, submandibular, tonsillar, preauricular or posterior auricular adenopathy.     Cervical: No cervical adenopathy.  Skin:    General: Skin is warm and dry.     Capillary Refill: Capillary refill takes less than 2 seconds.     Findings: No rash.  Neurological:     Mental Status: She is alert and oriented to person, place, and time.     Gait: Gait is intact.     Deep Tendon Reflexes: Reflexes are normal and symmetric.     Reflex Scores:      Brachioradialis reflexes are 2+ on the right side and 2+ on the left side.      Patellar reflexes are 2+ on the right side and 2+ on the left side. Psychiatric:        Attention and Perception: Attention normal.        Mood and Affect: Mood normal.        Speech: Speech normal.        Behavior: Behavior normal. Behavior  is cooperative.        Thought Content: Thought content normal.        Judgment: Judgment normal.   EKG My review and personal interpretation at Time: 1600  Indication: welcome to medicare  Rate: 66  Rhythm: sinus Axis: normal Other: no nonspecific st abn, no stemi, no lvh   Hearing Screening   _0  _1  _2  _3   Right ear _4 Left ear _5 Vision Screening   Right eye Left eye Both eyes  Without correction     With correction _6    Results for orders placed or performed in visit on 03/26/22  TSH  Result Value Ref Range   TSH 0.914 0.450 - 4.500 uIU/mL  T4, free  Result Value Ref Range   Free T4 1.49 0.82 - 1.77 ng/dL  CBC with Differential/Platelet  Result Value Ref Range   WBC 5.2 3.4 - 10.8 x10E3/uL   RBC 5.17 3.77 - 5.28 x10E6/uL   Hemoglobin 14.7 11.1 - 15.9 g/dL   Hematocrit 45.3 34.0 - 46.6 %   MCV 88 79 - 97 fL   MCH 28.4 26.6 - 33.0 pg   MCHC 32.5 31.5 - 35.7 g/dL   RDW 12.1 11.7 - 15.4 %   Platelets 275 150 - 450 x10E3/uL   Neutrophils 56 Not Estab. %   Lymphs 32 Not Estab. %   Monocytes 7 Not Estab. %   Eos 4 Not Estab. %   Basos 1 Not Estab. %   Neutrophils Absolute 2.9 1.4 - 7.0 x10E3/uL   Lymphocytes Absolute 1.7 0.7 - 3.1 x10E3/uL   Monocytes Absolute 0.4 0.1 - 0.9 x10E3/uL   EOS (ABSOLUTE) 0.2 0.0 - 0.4 x10E3/uL   Basophils Absolute 0.1 0.0 - 0.2 x10E3/uL   Immature Granulocytes 0 Not Estab. %   Immature Grans (Abs) 0.0 0.0 - 0.1 x10E3/uL  Comprehensive metabolic panel  Result Value Ref Range   Glucose 90 70 - 99 mg/dL   BUN 10 8 - 27 mg/dL   Creatinine, Ser 0.85 0.57 - 1.00 mg/dL   eGFR 76 >59 mL/min/1.73   BUN/Creatinine Ratio 12 12 - 28   Sodium 141 134 - 144 mmol/L   Potassium 4.3 3.5 - 5.2 mmol/L   Chloride 100 96 - 106 mmol/L   CO2 25 20 - 29 mmol/L   Calcium 9.9 8.7 -  10.3 mg/dL   Total Protein 7.0 6.0 - 8.5 g/dL   Albumin 4.6 3.9 - 4.9 g/dL   Globulin, Total 2.4 1.5 - 4.5 g/dL    Albumin/Globulin Ratio 1.9 1.2 - 2.2   Bilirubin Total 0.3 0.0 - 1.2 mg/dL   Alkaline Phosphatase 131 (H) 44 - 121 IU/L   AST 32 0 - 40 IU/L   ALT 24 0 - 32 IU/L  Lipid Panel w/o Chol/HDL Ratio  Result Value Ref Range   Cholesterol, Total 224 (H) 100 - 199 mg/dL   Triglycerides 129 0 - 149 mg/dL   HDL 75 >39 mg/dL   VLDL Cholesterol Cal 22 5 - 40 mg/dL   LDL Chol Calc (NIH) 127 (H) 0 - 99 mg/dL  VITAMIN D 25 Hydroxy (Vit-D Deficiency, Fractures)  Result Value Ref Range   Vit D, 25-Hydroxy 37.9 30.0 - 100.0 ng/mL  Magnesium  Result Value Ref Range   Magnesium 2.1 1.6 - 2.3 mg/dL      Assessment & Plan:   Problem List Items Addressed This Visit   None Visit Diagnoses     Medicare welcome exam    -  Primary   Overall preventative review performed -- mammo and DEXA ordered.  All remainder up to date. EKG, eye exam, and hearing test performed.  Labs up to date.   Relevant Orders   EKG 12-Lead (Completed)   Screening mammogram for breast cancer       Mammogram ordered.   Relevant Orders   MM 3D SCREEN BREAST BILATERAL        Follow up plan: Return for as scheduled in 6 months.  Preventative Services:  AAA screening: N/A Health Risk Assessment and Personalized Prevention Plan: Bone Mass Measurements: Ordered today Breast Cancer Screening: Ordered today CVD Screening: Up To Date Cervical Cancer Screening: N/A Colon Cancer Screening: Up To Date Depression Screening: Up To Date Diabetes Screening: N/A Glaucoma Screening: N/A Hepatitis B vaccine: Up To Date Hepatitis C screening: Up To Date HIV Screening: Up To Date Flu Vaccine: Up To Date Lung cancer Screening: N/A Obesity Screening: Up To Date Pneumonia Vaccines (2): x 1 received thus far STI Screening: N/A PSA screening: N/A  LABORATORY TESTING:  - Pap smear: not applicable  IMMUNIZATIONS:   - Tdap: Tetanus vaccination status reviewed: last tetanus booster within 10 years. - Influenza: Up to date -  Pneumovax: Not applicable - Prevnar: Up to date - COVID: Up to date - HPV: Not applicable - Shingrix vaccine: Up to date  SCREENING: -Mammogram: Ordered today  - Colonoscopy: Up to date  - Bone Density: Ordered today  -Hearing Test: Up to date  -Spirometry: Not applicable   PATIENT COUNSELING:   Advised to take 1 mg of folate supplement per day if capable of pregnancy.   Sexuality: Discussed sexually transmitted diseases, partner selection, use of condoms, avoidance of unintended pregnancy  and contraceptive alternatives.   Advised to avoid cigarette smoking.  I discussed with the patient that most people either abstain from alcohol or drink within safe limits (<=14/week and <=4 drinks/occasion for males, <=7/weeks and <= 3 drinks/occasion for females) and that the risk for alcohol disorders and other health effects rises proportionally with the number of drinks per week and how often a drinker exceeds daily limits.  Discussed cessation/primary prevention of drug use and availability of treatment for abuse.   Diet: Encouraged to adjust caloric intake to maintain  or achieve ideal body weight, to reduce intake of dietary saturated  fat and total fat, to limit sodium intake by avoiding high sodium foods and not adding table salt, and to maintain adequate dietary potassium and calcium preferably from fresh fruits, vegetables, and low-fat dairy products.    Stressed the importance of regular exercise  Injury prevention: Discussed safety belts, safety helmets, smoke detector, smoking near bedding or upholstery.   Dental health: Discussed importance of regular tooth brushing, flossing, and dental visits.    NEXT PREVENTATIVE PHYSICAL DUE IN 1 YEAR. Return for as scheduled in 6 months.

## 2022-04-23 ENCOUNTER — Telehealth: Payer: Self-pay | Admitting: Nurse Practitioner

## 2022-04-23 NOTE — Telephone Encounter (Signed)
Tracey calling from [facility] rheumatology is calling to request an order for a bone density pt is requesting. CB- [phone] Fax- [phone]

## 2022-04-27 LAB — HM DEXA SCAN

## 2022-05-03 ENCOUNTER — Encounter: Payer: Self-pay | Admitting: Nurse Practitioner

## 2022-05-04 ENCOUNTER — Encounter: Payer: Self-pay | Admitting: Nurse Practitioner

## 2022-07-01 NOTE — Patient Instructions (Signed)

## 2022-07-03 ENCOUNTER — Encounter: Payer: Self-pay | Admitting: Nurse Practitioner

## 2022-07-03 ENCOUNTER — Ambulatory Visit (INDEPENDENT_AMBULATORY_CARE_PROVIDER_SITE_OTHER): Payer: Medicare Other | Admitting: Nurse Practitioner

## 2022-07-03 VITALS — BP 104/69 | HR 77 | Temp 97.9°F | Ht 67.0 in | Wt 175.9 lb

## 2022-07-03 DIAGNOSIS — M2550 Pain in unspecified joint: Secondary | ICD-10-CM | POA: Diagnosis not present

## 2022-07-03 NOTE — Progress Notes (Signed)
BP 104/69   Pulse 77   Temp 97.9 F (36.6 C) (Oral)   Ht _0  (1.702 m)   Wt 175 lb 14.4 oz (79.8 kg)   SpO2 (!) 88%   BMI 27.55 kg/m    Subjective:    Patient ID: Cindy Jefferson, female    DOB: Apr 24, 1956, 66 y.o.   MRN: [id number]  HPI: Cindy Jefferson is a 66 y.o. female  Chief Complaint  Patient presents with   Joint Pain    Patient is here for Joint Pain. Patient says this episode is the 5th joint that causes pain and it will hurt really bad and then go away. Patient says then weeks later she will have another joint hurt really bad and then go away. Patient says she just wanted to have lab work as she has a family history of Lupus and Rheumatoid Arthritis.    ARTHRALGIAS / JOINT ACHES Having multiple joint pains -- family history of Lupus and RA -- sister, daughter, and niece.  Symptoms started January 2023 to right knee, then this transferred to left wrist, then left elbow, then right elbow, and then to right hand around thumb and now to left hand in same area.  Pain moves to different joint areas.  She does have underlying hypothyroid. Duration: months Pain: yes Symmetric: yes  8/10 Quality: dull, aching, and throbbing -- deep pain Frequency: constant when joint is hurting and then goes away Context:  fluctuating Decreased function/range of motion: none Erythema: none Swelling: yes, a little Heat or warmth: sometimes Morning stiffness: yes -- lasts for <30 minutes Aggravating factors: movement, grasping Alleviating factors: nothing Relief with NSAIDs?: No NSAIDs Taken Treatments attempted:  rest and heat , Voltaren gel Involved Joints:     Hands: yes bilateral    Wrists: yes left     Elbows: yes bilateral    Shoulders: none    Back: yes     Hips: none    Knees: yes bilateral    Ankles: none    Feet: none  Relevant past medical, surgical, family and social history reviewed and updated as indicated. Interim medical history since our last visit  reviewed. Allergies and medications reviewed and updated.  Review of Systems  Constitutional:  Negative for activity change, appetite change, diaphoresis, fatigue and fever.  Respiratory:  Negative for cough, chest tightness and shortness of breath.   Cardiovascular:  Negative for chest pain, palpitations and leg swelling.  Gastrointestinal: Negative.   Musculoskeletal:  Positive for arthralgias.  Neurological: Negative.   Psychiatric/Behavioral: Negative.     Per HPI unless specifically indicated above     Objective:    BP 104/69   Pulse 77   Temp 97.9 F (36.6 C) (Oral)   Ht _1  (1.702 m)   Wt 175 lb 14.4 oz (79.8 kg)   SpO2 (!) 88%   BMI 27.55 kg/m   Wt Readings from Last 3 Encounters:  07/03/22 175 lb 14.4 oz (79.8 kg)  04/16/22 170 lb 14.4 oz (77.5 kg)  03/26/22 171 lb 8 oz (77.8 kg)    Physical Exam Vitals and nursing note reviewed.  Constitutional:      General: She is awake. She is not in acute distress.    Appearance: She is well-developed and well-groomed. She is not ill-appearing or toxic-appearing.  HENT:     Head: Normocephalic.     Right Ear: Hearing normal.     Left Ear: Hearing normal.  Eyes:  General: Lids are normal.        Right eye: No discharge.        Left eye: No discharge.     Conjunctiva/sclera: Conjunctivae normal.     Pupils: Pupils are equal, round, and reactive to light.  Neck:     Thyroid: No thyromegaly.     Vascular: No carotid bruit.  Cardiovascular:     Rate and Rhythm: Normal rate and regular rhythm.     Heart sounds: Normal heart sounds. No murmur heard.    No gallop.  Pulmonary:     Effort: Pulmonary effort is normal. No accessory muscle usage or respiratory distress.     Breath sounds: Normal breath sounds.  Abdominal:     General: Bowel sounds are normal.     Palpations: Abdomen is soft. There is no hepatomegaly or splenomegaly.  Musculoskeletal:     Right wrist: Normal.     Left wrist: Normal.     Right hand:  Swelling present. No deformity, tenderness or bony tenderness. Normal range of motion. Normal strength. Normal pulse.     Left hand: Swelling present. No deformity, tenderness or bony tenderness. Normal range of motion. Normal strength. Normal pulse.     Cervical back: Normal range of motion and neck supple.     Right lower leg: No edema.     Left lower leg: No edema.  Lymphadenopathy:     Cervical: No cervical adenopathy.  Skin:    General: Skin is warm and dry.  Neurological:     Mental Status: She is alert and oriented to person, place, and time.     Cranial Nerves: Cranial nerves 2-12 are intact.     Deep Tendon Reflexes: Reflexes are normal and symmetric.     Reflex Scores:      Brachioradialis reflexes are 2+ on the right side and 2+ on the left side.      Patellar reflexes are 2+ on the right side and 2+ on the left side. Psychiatric:        Attention and Perception: Attention normal.        Mood and Affect: Mood normal.        Speech: Speech normal.        Behavior: Behavior normal. Behavior is cooperative.        Thought Content: Thought content normal.    Results for orders placed or performed in visit on 05/04/22  HM COLONOSCOPY  Result Value Ref Range   HM Colonoscopy See Report (in chart) See Report (in chart), Patient Reported      Assessment & Plan:   Problem List Items Addressed This Visit       Other   Multiple joint pain - Primary    Ongoing for several months with lengthy family history of autoimmune joint disorders.  Will check labs today to include ANA, uric acid, Rf, CRP, and ESR. No red flags on exam today.  Determine next steps after return of all labs, discussed plan with patient.      Relevant Orders   Uric acid   ANA 12 Plus Profile (RDL)   Rheumatoid factor   C-reactive protein   Sed Rate (ESR)     Follow up plan: Return if symptoms worsen or fail to improve.

## 2022-07-03 NOTE — Assessment & Plan Note (Signed)
Ongoing for several months with lengthy family history of autoimmune joint disorders.  Will check labs today to include ANA, uric acid, Rf, CRP, and ESR. No red flags on exam today.  Determine next steps after return of all labs, discussed plan with patient.

## 2022-07-04 NOTE — Progress Notes (Signed)
Contacted via [place] afternoon Juliann Pulse, waiting on ANA to return.  Remainder of labs are currently normal.  Any questions? Keep being amazing!!  Thank you for allowing me to participate in your care.  I appreciate you. Kindest regards, Keila Turan

## 2022-07-09 NOTE — Progress Notes (Signed)
Contacted via MyChart   So far ANA and rheumatoid factor are negative + inflammatory labs.  Waiting on one more lab.:)

## 2022-07-11 LAB — RHEUMATOID FACTOR: Rheumatoid fact SerPl-aCnc: <10 IU/mL (ref <14.0)

## 2022-07-11 LAB — C-REACTIVE PROTEIN: CRP: 7 mg/L (ref 0–10)

## 2022-07-11 LAB — SEDIMENTATION RATE: Sed Rate: 9 mm/hr (ref 0–40)

## 2022-07-11 LAB — URIC ACID: Uric Acid: 4.4 mg/dL (ref 3.0–7.2)

## 2022-07-11 LAB — ANA 12 PLUS PROFILE (RDL): Anti-Nuclear Ab by IFA (RDL): NEGATIVE

## 2022-07-11 LAB — ANTI-RO/NEG ANA: Anti-Ro (SS-A) Ab (RDL): <20 Units (ref <20)

## 2022-07-11 NOTE — Progress Notes (Signed)
Contacted via [place]   All testing returned negative or normal = joint pain may be more osteoarthritis OR we could also test for alternate joint pain causes next visit -- have you ever had a tick bite?

## 2022-07-20 ENCOUNTER — Telehealth (INDEPENDENT_AMBULATORY_CARE_PROVIDER_SITE_OTHER): Payer: Medicare Other | Admitting: Nurse Practitioner

## 2022-07-20 ENCOUNTER — Encounter: Payer: Self-pay | Admitting: Nurse Practitioner

## 2022-07-20 VITALS — BP 125/79 | HR 77 | Temp 98.4°F

## 2022-07-20 DIAGNOSIS — N952 Postmenopausal atrophic vaginitis: Secondary | ICD-10-CM | POA: Insufficient documentation

## 2022-07-20 MED ORDER — NITROFURANTOIN MONOHYD MACRO 100 MG PO CAPS: 100.0000 mg | ORAL_CAPSULE | ORAL | 6 refills | Status: DC | PRN

## 2022-07-20 NOTE — Assessment & Plan Note (Signed)
With UTI risk after intercourse -- will continue Macrobid use after intercourse.  Refills sent in.

## 2022-07-20 NOTE — Patient Instructions (Signed)
Urinary Tract Infection, Adult A urinary tract infection (UTI) is an infection of any part of the urinary tract. The urinary tract includes: The kidneys. The ureters. The bladder. The urethra. These organs make, store, and get rid of pee (urine) in the body. What are the causes? This infection is caused by germs (bacteria) in your genital area. These germs grow and cause swelling (inflammation) of your urinary tract. What increases the risk? The following factors may make you more likely to develop this condition: Using a small, thin tube (catheter) to drain pee. Not being able to control when you pee or poop (incontinence). Being female. If you are female, these things can increase the risk: Using these methods to prevent pregnancy: A medicine that kills sperm (spermicide). A device that blocks sperm (diaphragm). Having low levels of a female hormone (estrogen). Being pregnant. You are more likely to develop this condition if: You have genes that add to your risk. You are sexually active. You take antibiotic medicines. You have trouble peeing because of: A prostate that is bigger than normal, if you are female. A blockage in the part of your body that drains pee from the bladder. A kidney stone. A nerve condition that affects your bladder. Not getting enough to drink. Not peeing often enough. You have other conditions, such as: Diabetes. A weak disease-fighting system (immune system). Sickle cell disease. Gout. Injury of the spine. What are the signs or symptoms? Symptoms of this condition include: Needing to pee right away. Peeing small amounts often. Pain or burning when peeing. Blood in the pee. Pee that smells bad or not like normal. Trouble peeing. Pee that is cloudy. Fluid coming from the vagina, if you are female. Pain in the belly or lower back. Other symptoms include: Vomiting. Not feeling hungry. Feeling mixed up (confused). This may be the first symptom in  older adults. Being tired and grouchy (irritable). A fever. Watery poop (diarrhea). How is this treated? Taking antibiotic medicine. Taking other medicines. Drinking enough water. In some cases, you may need to see a specialist. Follow these instructions at home:  Medicines Take over-the-counter and prescription medicines only as told by your doctor. If you were prescribed an antibiotic medicine, take it as told by your doctor. Do not stop taking it even if you start to feel better. General instructions Make sure you: Pee until your bladder is empty. Do not hold pee for a long time. Empty your bladder after sex. Wipe from front to back after peeing or pooping if you are a female. Use each tissue one time when you wipe. Drink enough fluid to keep your pee pale yellow. Keep all follow-up visits. Contact a doctor if: You do not get better after 1-2 days. Your symptoms go away and then come back. Get help right away if: You have very bad back pain. You have very bad pain in your lower belly. You have a fever. You have chills. You feeling like you will vomit or you vomit. Summary A urinary tract infection (UTI) is an infection of any part of the urinary tract. This condition is caused by germs in your genital area. There are many risk factors for a UTI. Treatment includes antibiotic medicines. Drink enough fluid to keep your pee pale yellow. This information is not intended to replace advice given to you by your health care provider. Make sure you discuss any questions you have with your health care provider. Document Revised: 04/01/2020 Document Reviewed: 04/01/2020 Elsevier Patient Education    2023 Elsevier Inc.  

## 2022-07-20 NOTE — Progress Notes (Signed)
BP 125/79   Pulse 77   Temp 98.4 F (36.9 C) (Tympanic)    Subjective:    Patient ID: Cindy Jefferson, female    DOB: 01-14-1956, 66 y.o.   MRN: [id number]  HPI: Cindy Jefferson is a 66 y.o. female  Chief Complaint  Patient presents with   Medication Refill    Was Macrobid, for preventive for UTI's   This visit was completed via video visit through MyChart due to the restrictions of the COVID-19 pandemic. All issues as above were discussed and addressed. Physical exam was done as above through visual confirmation on video through MyChart. If it was felt that the patient should be evaluated in the office, they were directed there. The patient verbally consented to this visit. Location of the patient: home Location of the provider: work Those involved with this call:  Provider: Marnee Guarneri, DNP CMA: Frazier Butt, [place] Desk/Registration: [employer]  Time spent on call:  21 minutes with patient face to face via video conference. More than 50% of this time was spent in counseling and coordination of care. 15 minutes total spent in review of patient's record and preparation of their chart.  I verified patient identity using two factors (patient name and date of birth). Patient consents verbally to being seen via telemedicine visit today.    URINARY INFECTION Uses Macrobid after sexual intercourse for UTI prevention.  Has been on for 3 years.  Needs refills. Dysuria: no Urinary frequency: no Urgency: no Small volume voids: no Symptom severity: no Urinary incontinence: no Foul odor: no Hematuria: no Abdominal pain: no Back pain: no Suprapubic pain/pressure: no Flank pain: no Fever:  no Vomiting: no Status: stable Previous urinary tract infection: yes Recurrent urinary tract infection: no Sexual activity: monogamous Treatments attempted: increasing fluids    Relevant past medical, surgical, family and social history reviewed and updated as indicated. Interim  medical history since our last visit reviewed. Allergies and medications reviewed and updated.  Review of Systems  Constitutional:  Negative for activity change, appetite change, diaphoresis, fatigue and fever.  Respiratory:  Negative for cough, chest tightness and shortness of breath.   Cardiovascular:  Negative for chest pain, palpitations and leg swelling.  Gastrointestinal: Negative.   Neurological: Negative.   Psychiatric/Behavioral: Negative.     Per HPI unless specifically indicated above     Objective:    BP 125/79   Pulse 77   Temp 98.4 F (36.9 C) (Tympanic)   Wt Readings from Last 3 Encounters:  07/03/22 175 lb 14.4 oz (79.8 kg)  04/16/22 170 lb 14.4 oz (77.5 kg)  03/26/22 171 lb 8 oz (77.8 kg)    Physical Exam Vitals and nursing note reviewed.  Constitutional:      General: She is awake. She is not in acute distress.    Appearance: She is well-developed. She is not ill-appearing.  HENT:     Head: Normocephalic.     Right Ear: Hearing normal.     Left Ear: Hearing normal.  Eyes:     General: Lids are normal.        Right eye: No discharge.        Left eye: No discharge.     Conjunctiva/sclera: Conjunctivae normal.  Pulmonary:     Effort: Pulmonary effort is normal. No accessory muscle usage or respiratory distress.  Musculoskeletal:     Cervical back: Normal range of motion.  Neurological:     Mental Status: She is alert and  oriented to person, place, and time.  Psychiatric:        Attention and Perception: Attention normal.        Mood and Affect: Mood normal.        Behavior: Behavior normal. Behavior is cooperative.        Thought Content: Thought content normal.        Judgment: Judgment normal.    Results for orders placed or performed in visit on 07/03/22  Uric acid  Result Value Ref Range   Uric Acid 4.4 3.0 - 7.2 mg/dL  ANA 12 Plus Profile (RDL)  Result Value Ref Range   Anti-Nuclear Ab by IFA (RDL) Negative Negative  Rheumatoid factor   Result Value Ref Range   Rhuematoid fact SerPl-aCnc <10.0 <14.0 IU/mL  C-reactive protein  Result Value Ref Range   CRP 7 0 - 10 mg/L  Sed Rate (ESR)  Result Value Ref Range   Sed Rate 9 0 - 40 mm/hr  Anti-Ro/Neg ANA  Result Value Ref Range   Anti-Ro (SS-A) Ab (RDL) <20 <20 Units      Assessment & Plan:   Problem List Items Addressed This Visit       Genitourinary   Vaginal atrophy - Primary    With UTI risk after intercourse -- will continue Macrobid use after intercourse.  Refills sent in.       I discussed the assessment and treatment plan with the patient. The patient was provided an opportunity to ask questions and all were answered. The patient agreed with the plan and demonstrated an understanding of the instructions.   The patient was advised to call back or seek an in-person evaluation if the symptoms worsen or if the condition fails to improve as anticipated.   I provided 21+ minutes of time during this encounter.    Follow up plan: Return if symptoms worsen or fail to improve.

## 2022-07-29 NOTE — Patient Instructions (Addendum)
IMAGING LOCATION: [address], [place], [place] [address] [phone]   Hip Pain The hip is the joint between the upper legs and the lower pelvis. The bones, cartilage, tendons, and muscles of your hip joint support your body and allow you to move around. Hip pain can range from a minor ache to severe pain in one or both of your hips. The pain may be felt on the inside of the hip joint near the groin, or on the outside near the buttocks and upper thigh. You may also have swelling or stiffness in your hip area. Follow these instructions at home: Managing pain, stiffness, and swelling     If directed, put ice on the painful area. To do this: Put ice in a plastic bag. Place a towel between your skin and the bag. Leave the ice on for 20 minutes, 2-3 times a day. If directed, apply heat to the affected area as often as told by your health care provider. Use the heat source that your health care provider recommends, such as a moist heat pack or a heating pad. Place a towel between your skin and the heat source. Leave the heat on for 20-30 minutes. Remove the heat if your skin turns bright red. This is especially important if you are unable to feel pain, heat, or cold. You may have a greater risk of getting burned. Activity Do exercises as told by your health care provider. Avoid activities that cause pain. General instructions  Take over-the-counter and prescription medicines only as told by your health care provider. Keep a journal of your symptoms. Write down: How often you have hip pain. The location of your pain. What the pain feels like. What makes the pain worse. Sleep with a pillow between your legs on your most comfortable side. Keep all follow-up visits as told by your health care provider. This is important. Contact a health care provider if: You cannot put weight on your leg. Your pain or swelling continues or gets worse after one week. It gets harder to  walk. You have a fever. Get help right away if: You fall. You have a sudden increase in pain and swelling in your hip. Your hip is red or swollen or very tender to touch. Summary Hip pain can range from a minor ache to severe pain in one or both of your hips. The pain may be felt on the inside of the hip joint near the groin, or on the outside near the buttocks and upper thigh. Avoid activities that cause pain. Write down how often you have hip pain, the location of the pain, what makes it worse, and what it feels like. This information is not intended to replace advice given to you by your health care provider. Make sure you discuss any questions you have with your health care provider. Document Revised: 01/05/2019 Document Reviewed: 01/05/2019 Elsevier Patient Education  [employer].

## 2022-08-01 ENCOUNTER — Ambulatory Visit
Admission: RE | Admit: 2022-08-01 | Discharge: 2022-08-01 | Disposition: A | Discharge status: Home / Self Care | Payer: Medicare Other | Source: Ambulatory Visit | Attending: Nurse Practitioner | Admitting: Nurse Practitioner

## 2022-08-01 ENCOUNTER — Encounter: Payer: Self-pay | Admitting: Nurse Practitioner

## 2022-08-01 ENCOUNTER — Ambulatory Visit
Admission: RE | Admit: 2022-08-01 | Discharge: 2022-08-01 | Disposition: A | Discharge status: Home / Self Care | Payer: Medicare Other | Attending: Nurse Practitioner | Admitting: Nurse Practitioner

## 2022-08-01 ENCOUNTER — Ambulatory Visit (INDEPENDENT_AMBULATORY_CARE_PROVIDER_SITE_OTHER): Payer: Medicare Other | Admitting: Nurse Practitioner

## 2022-08-01 VITALS — BP 136/85 | HR 64 | Temp 97.7°F | Ht 67.01 in | Wt 174.7 lb

## 2022-08-01 DIAGNOSIS — M25552 Pain in left hip: Secondary | ICD-10-CM | POA: Diagnosis not present

## 2022-08-01 NOTE — Progress Notes (Signed)
Acute Office Visit  Subjective:     Patient ID: Cindy Jefferson, female    DOB: 02/29/1956, 66 y.o.   MRN: [id number]  Chief Complaint  Patient presents with   Hip Pain    Left hip pain for a few months,  radiates down leg. Happens mostly when she is laying down    Left hip pain presenting over past 2 months, at baseline has pain to hips, but this has been worsening left side.  At times a stab pain directly to hip and other times burning pain down leg.  A few months ago she did go to her mother's, was stepping up a step and did mildly trip on deck.  Last imaging to left hip in 2020 with no arthritic changes.  Hip Pain  The incident occurred more than 1 week ago. Incident location: no injuries. There was no injury mechanism. The pain is present in the left hip. The pain is at a severity of 5/10. The pain is moderate. The pain has been Constant (with intermittent more sharp pains) since onset. Associated symptoms include numbness (occasional). Pertinent negatives include no inability to bear weight, loss of sensation, muscle weakness or tingling. She reports no foreign bodies present. The symptoms are aggravated by movement and weight bearing. She has tried rest for the symptoms. The treatment provided no relief.   Patient is in today for hip pain left side for months.  Review of Systems  Constitutional:  Negative for chills, fever and malaise/fatigue.  Respiratory: Negative.    Cardiovascular: Negative.   Musculoskeletal:  Positive for joint pain.  Neurological:  Positive for weakness (sometimes) and numbness (occasional). Negative for dizziness, tingling and tremors.  Psychiatric/Behavioral: Negative.        Objective:    BP 136/85   Pulse 64   Temp 97.7 F (36.5 C) (Oral)   Ht 5' 7.01" (1.702 m)   Wt 174 lb 11.2 oz (79.2 kg)   SpO2 98%   BMI 27.36 kg/m  BP Readings from Last 3 Encounters:  08/01/22 136/85  07/20/22 125/79  07/03/22 104/69   Wt Readings from Last 3  Encounters:  08/01/22 174 lb 11.2 oz (79.2 kg)  07/03/22 175 lb 14.4 oz (79.8 kg)  04/16/22 170 lb 14.4 oz (77.5 kg)   Physical Exam Vitals and nursing note reviewed.  Constitutional:      General: She is awake. She is not in acute distress.    Appearance: She is well-developed and well-groomed. She is not ill-appearing or toxic-appearing.  HENT:     Head: Normocephalic.     Right Ear: Hearing normal.     Left Ear: Hearing normal.  Eyes:     General: Lids are normal.        Right eye: No discharge.        Left eye: No discharge.     Conjunctiva/sclera: Conjunctivae normal.     Pupils: Pupils are equal, round, and reactive to light.  Neck:     Thyroid: No thyromegaly.     Vascular: No carotid bruit.  Cardiovascular:     Rate and Rhythm: Normal rate and regular rhythm.     Heart sounds: Normal heart sounds. No murmur heard.    No gallop.  Pulmonary:     Effort: Pulmonary effort is normal. No accessory muscle usage or respiratory distress.     Breath sounds: Normal breath sounds.  Abdominal:     General: Bowel sounds are normal.  Palpations: Abdomen is soft. There is no hepatomegaly or splenomegaly.  Musculoskeletal:     Cervical back: Normal range of motion and neck supple.     Right hip: Normal.     Left hip: Tenderness (to left hip lateral) present. No deformity, bony tenderness or crepitus. Normal range of motion. Normal strength.     Right lower leg: No edema.     Left lower leg: No edema.  Lymphadenopathy:     Cervical: No cervical adenopathy.  Skin:    General: Skin is warm and dry.  Neurological:     Mental Status: She is alert and oriented to person, place, and time.     Cranial Nerves: Cranial nerves 2-12 are intact.     Deep Tendon Reflexes: Reflexes are normal and symmetric.     Reflex Scores:      Brachioradialis reflexes are 2+ on the right side and 2+ on the left side.      Patellar reflexes are 2+ on the right side and 2+ on the left  side. Psychiatric:        Attention and Perception: Attention normal.        Mood and Affect: Mood normal.        Speech: Speech normal.        Behavior: Behavior normal. Behavior is cooperative.        Thought Content: Thought content normal.       Assessment & Plan:   Problem List Items Addressed This Visit       Other   Left hip pain - Primary    Chronic for 3 months -- no improvement.  At this time obtain imaging of left hip and lumbar spine to further assess.  Recommend PT exercises at home.  Take Tylenol as needed + Voltaren gel to hip area.  Alternate ice and heat.  Will determine next steps after imaging returns.  Return in 3 weeks.      Relevant Orders   DG Lumbar Spine Complete   DG HIP UNILAT W OR W/O PELVIS 2-3 VIEWS LEFT    No orders of the defined types were placed in this encounter.   Return in about 3 weeks (around 08/22/2022) for Left Hip Pain.  Venita Lick, NP

## 2022-08-01 NOTE — Assessment & Plan Note (Signed)
Chronic for 3 months -- no improvement.  At this time obtain imaging of left hip and lumbar spine to further assess.  Recommend PT exercises at home.  Take Tylenol as needed + Voltaren gel to hip area.  Alternate ice and heat.  Will determine next steps after imaging returns.  Return in 3 weeks.

## 2022-08-03 NOTE — Progress Notes (Signed)
Contacted via [place] morning Savannah[place], your imaging has returned: - Overall hip appears stable with osteopenia noted, which we are aware of, and mild arthritic changes. 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  They can get damaged over time with overuse or injury.  Main treatment for this would be physical therapy, anti inflammatory medications (Ibuprofen), and sometimes changes in shoes with orthotics.  It can cause hip pain, so physical therapy may be beneficial in future if ongoing pain. - There is spondylosis mildly to lower back (this means wear and tear -- arthritis), this can also cause hip pain and again physical therapy may offer benefit.  Thoughts?  Would you like to hold off on PT and see how pain does or would you like to start some physical therapy? Keep being awesome!!  Thank you for allowing me to participate in your care.  I appreciate you. Kindest regards, Graysyn Bache

## 2022-08-09 ENCOUNTER — Ambulatory Visit
Admission: RE | Admit: 2022-08-09 | Discharge: 2022-08-09 | Disposition: A | Discharge status: Home / Self Care | Payer: Medicare Other | Source: Ambulatory Visit | Attending: Nurse Practitioner | Admitting: Nurse Practitioner

## 2022-08-09 DIAGNOSIS — Z1231 Encounter for screening mammogram for malignant neoplasm of breast: Secondary | ICD-10-CM | POA: Insufficient documentation

## 2022-08-09 NOTE — Progress Notes (Signed)
Contacted via MyChart   Normal mammogram, may repeat in one year:)

## 2022-08-17 ENCOUNTER — Encounter: Payer: Self-pay | Admitting: Nurse Practitioner

## 2022-08-23 ENCOUNTER — Encounter: Payer: Self-pay | Admitting: Nurse Practitioner

## 2022-08-23 ENCOUNTER — Telehealth (INDEPENDENT_AMBULATORY_CARE_PROVIDER_SITE_OTHER): Payer: Medicare Other | Admitting: Nurse Practitioner

## 2022-08-23 DIAGNOSIS — R051 Acute cough: Secondary | ICD-10-CM | POA: Diagnosis not present

## 2022-08-23 MED ORDER — ALBUTEROL SULFATE HFA 108 (90 BASE) MCG/ACT IN AERS: 2.0000 | INHALATION_SPRAY | Freq: Four times a day (QID) | RESPIRATORY_TRACT | 0 refills | Status: DC | PRN

## 2022-08-23 NOTE — Assessment & Plan Note (Signed)
Acute and ongoing for over one week, treated in UC with abx and steroids.  She is gradually improving at this time.  Recommend she start taking Claritin 10 MG daily + continue Mucinex as needed.  Will send in Albuterol inhaler to use as needed.  Recommend: - Increased rest - Increasing Fluids - Acetaminophen as needed for fever/pain.  - Salt water gargling, chloraseptic spray and throat lozenges - Mucinex.  - Humidifying the air.

## 2022-08-23 NOTE — Patient Instructions (Signed)

## 2022-08-23 NOTE — Progress Notes (Signed)
There were no vitals taken for this visit.   Subjective:    Patient ID: Cindy Jefferson, female    DOB: [birth year], 66 y.o.   MRN: [id number]  HPI: Cindy Jefferson is a 66 y.o. female  Chief Complaint  Patient presents with   Fatigue    Congestion, cough, Was treated on 12/15 and started feeling better  today. Would like to know if she should continue with any otc meds to help with cough and congestion.   This visit was completed via video visit through MyChart due to the restrictions of the COVID-19 pandemic. All issues as above were discussed and addressed. Physical exam was done as above through visual confirmation on video through MyChart. If it was felt that the patient should be evaluated in the office, they were directed there. The patient verbally consented to this visit. Location of the patient: home Location of the provider: work Those involved with this call:  Provider: Marnee Guarneri, DNP CMA: Frazier Butt, [employer] Desk/Registration: [employer]  Time spent on call:  21 minutes with patient face to face via video conference. More than 50% of this time was spent in counseling and coordination of care. 15 minutes total spent in review of patient's record and preparation of their chart.  I verified patient identity using two factors (patient name and date of birth). Patient consents verbally to being seen via telemedicine visit today.    UPPER RESPIRATORY TRACT INFECTION Presents for ongoing congestion post URI.  She was seen in UC on 08/16/22, was given abx + steroid -- the first day she threw all her medicine up.  She took 5 days of abx treatment, did not take full 7 days due to diarrhea with abx.  Continues to have fatigue from this.  Reports it is some better this morning.  Has ongoing lingering cough, mild.  Was tested for RSV, Covid, Flu, and Strep == all negative. Fever: no Cough: yes - moderate Shortness of breath: with activity Wheezing: no Chest pain:  no Chest tightness: no Chest congestion: no Nasal congestion: yes Runny nose: yes Post nasal drip: yes Sneezing: no Sore throat: no Swollen glands: no Sinus pressure: no Headache: no Face pain: no Toothache: no Ear pain: none Ear pressure: this morning, but is better Eyes red/itching:no Eye drainage/crusting: no  Vomiting: no Rash: no Fatigue: yes Sick contacts: no Strep contacts: no  Context: slowly improving Recurrent sinusitis: no Relief with OTC cold/cough medications: yes  Treatments attempted: Mucinex    Relevant past medical, surgical, family and social history reviewed and updated as indicated. Interim medical history since our last visit reviewed. Allergies and medications reviewed and updated.  Review of Systems  Constitutional:  Positive for fatigue. Negative for activity change, appetite change, chills and fever.  HENT:  Positive for congestion, postnasal drip and rhinorrhea. Negative for ear discharge, ear pain, facial swelling, sinus pressure, sinus pain, sneezing, sore throat and voice change.   Respiratory:  Positive for cough. Negative for chest tightness, shortness of breath and wheezing.   Cardiovascular:  Negative for chest pain, palpitations and leg swelling.  Gastrointestinal: Negative.   Neurological: Negative.   Psychiatric/Behavioral: Negative.     Per HPI unless specifically indicated above     Objective:    There were no vitals taken for this visit.  Wt Readings from Last 3 Encounters:  08/01/22 174 lb 11.2 oz (79.2 kg)  07/03/22 175 lb 14.4 oz (79.8 kg)  04/16/22 170 lb 14.4 oz (  77.5 kg)    Physical Exam Vitals and nursing note reviewed.  Constitutional:      General: She is awake. She is not in acute distress.    Appearance: She is well-developed and well-groomed. She is not ill-appearing or toxic-appearing.  HENT:     Head: Normocephalic.     Right Ear: Hearing normal.     Left Ear: Hearing normal.  Eyes:     General: Lids are  normal.        Right eye: No discharge.        Left eye: No discharge.     Conjunctiva/sclera: Conjunctivae normal.  Pulmonary:     Effort: Pulmonary effort is normal. No accessory muscle usage or respiratory distress.  Musculoskeletal:     Cervical back: Normal range of motion.  Neurological:     Mental Status: She is alert and oriented to person, place, and time.  Psychiatric:        Attention and Perception: Attention normal.        Mood and Affect: Mood normal.        Behavior: Behavior normal. Behavior is cooperative.        Thought Content: Thought content normal.        Judgment: Judgment normal.    Results for orders placed or performed in visit on 07/03/22  Uric acid  Result Value Ref Range   Uric Acid 4.4 3.0 - 7.2 mg/dL  ANA 12 Plus Profile (RDL)  Result Value Ref Range   Anti-Nuclear Ab by IFA (RDL) Negative Negative  Rheumatoid factor  Result Value Ref Range   Rhuematoid fact SerPl-aCnc <10.0 <14.0 IU/mL  C-reactive protein  Result Value Ref Range   CRP 7 0 - 10 mg/L  Sed Rate (ESR)  Result Value Ref Range   Sed Rate 9 0 - 40 mm/hr  Anti-Ro/Neg ANA  Result Value Ref Range   Anti-Ro (SS-A) Ab (RDL) <20 <20 Units      Assessment & Plan:   Problem List Items Addressed This Visit       Other   Acute cough - Primary    Acute and ongoing for over one week, treated in UC with abx and steroids.  She is gradually improving at this time.  Recommend she start taking Claritin 10 MG daily + continue Mucinex as needed.  Will send in Albuterol inhaler to use as needed.  Recommend: - Increased rest - Increasing Fluids - Acetaminophen as needed for fever/pain.  - Salt water gargling, chloraseptic spray and throat lozenges - Mucinex.  - Humidifying the air.        I discussed the assessment and treatment plan with the patient. The patient was provided an opportunity to ask questions and all were answered. The patient agreed with the plan and demonstrated an  understanding of the instructions.   The patient was advised to call back or seek an in-person evaluation if the symptoms worsen or if the condition fails to improve as anticipated.   I provided 21+ minutes of time during this encounter.    Follow up plan: Return if symptoms worsen or fail to improve.

## 2022-08-24 ENCOUNTER — Ambulatory Visit: Payer: Medicare Other | Admitting: Nurse Practitioner

## 2022-08-24 ENCOUNTER — Other Ambulatory Visit: Payer: Self-pay | Admitting: Student

## 2022-08-24 DIAGNOSIS — M545 Low back pain, unspecified: Secondary | ICD-10-CM

## 2022-08-29 ENCOUNTER — Ambulatory Visit
Admission: RE | Admit: 2022-08-29 | Discharge: 2022-08-29 | Disposition: A | Discharge status: Home / Self Care | Payer: Medicare Other | Source: Ambulatory Visit | Attending: Student | Admitting: Student

## 2022-08-29 DIAGNOSIS — M545 Low back pain, unspecified: Secondary | ICD-10-CM | POA: Diagnosis present

## 2022-09-14 ENCOUNTER — Other Ambulatory Visit: Payer: Self-pay | Admitting: Nurse Practitioner

## 2022-09-14 NOTE — Telephone Encounter (Signed)
Requested Prescriptions  Pending Prescriptions Disp Refills   albuterol (VENTOLIN HFA) 108 (90 Base) MCG/ACT inhaler [Pharmacy Med Name: ALBUTEROL HFA INH(200 PUFFS) 18GM] 18 g 0    Sig: INHALE 2 PUFFS INTO THE LUNGS EVERY 6 HOURS AS NEEDED FOR WHEEZING OR SHORTNESS OF BREATH     Pulmonology:  Beta Agonists 2 Passed - 09/14/2022  3:27 AM      Passed - Last BP in normal range    BP Readings from Last 1 Encounters:  08/01/22 136/85         Passed - Last Heart Rate in normal range    Pulse Readings from Last 1 Encounters:  08/01/22 64         Passed - Valid encounter within last 12 months    Recent Outpatient Visits           3 weeks ago Acute cough   [facility], [place] T, NP   1 month ago Left hip pain   [place], [place] T, NP   1 month ago Vaginal atrophy   [place], Barbaraann Faster, NP   2 months ago Multiple joint pain   [place], [place] T, NP   5 months ago Medicare welcome exam   [place], Barbaraann Faster, NP       Future Appointments             In 1 week [place], Barbaraann Faster, NP [employer]

## 2022-09-19 ENCOUNTER — Ambulatory Visit: Payer: Medicare Other | Admitting: Dermatology

## 2022-09-19 ENCOUNTER — Encounter: Payer: Self-pay | Admitting: Dermatology

## 2022-09-19 ENCOUNTER — Ambulatory Visit (INDEPENDENT_AMBULATORY_CARE_PROVIDER_SITE_OTHER): Payer: Medicare Other | Admitting: Dermatology

## 2022-09-19 DIAGNOSIS — Z1283 Encounter for screening for malignant neoplasm of skin: Secondary | ICD-10-CM | POA: Diagnosis not present

## 2022-09-19 DIAGNOSIS — L719 Rosacea, unspecified: Secondary | ICD-10-CM | POA: Diagnosis not present

## 2022-09-19 DIAGNOSIS — L821 Other seborrheic keratosis: Secondary | ICD-10-CM | POA: Diagnosis not present

## 2022-09-19 DIAGNOSIS — L814 Other melanin hyperpigmentation: Secondary | ICD-10-CM

## 2022-09-19 DIAGNOSIS — L570 Actinic keratosis: Secondary | ICD-10-CM | POA: Diagnosis not present

## 2022-09-19 DIAGNOSIS — D229 Melanocytic nevi, unspecified: Secondary | ICD-10-CM

## 2022-09-19 DIAGNOSIS — L578 Other skin changes due to chronic exposure to nonionizing radiation: Secondary | ICD-10-CM

## 2022-09-19 NOTE — Progress Notes (Signed)
Follow-Up Visit   Subjective  Cindy Jefferson is a 67 y.o. female who presents for the following: FBSE (Hx AK's. Patient has noticed some red spots at nose and left side of face. No itching. ).  The patient presents for Total-Body Skin Exam (TBSE) for skin cancer screening and mole check.  The patient has spots, moles and lesions to be evaluated, some may be new or changing and the patient has concerns that these could be cancer.  Family history of skin cancer - what type(s): melanoma, BCC, SCC - who affected: sister   The following portions of the chart were reviewed this encounter and updated as appropriate:   Tobacco  Allergies  Meds  Problems  Med Hx  Surg Hx  Fam Hx      Review of Systems:  No other skin or systemic complaints except as noted in HPI or Assessment and Plan.  Objective  Well appearing patient in no apparent distress; mood and affect are within normal limits.  A full examination was performed including scalp, head, eyes, ears, nose, lips, neck, chest, axillae, abdomen, back, buttocks, bilateral upper extremities, bilateral lower extremities, hands, feet, fingers, toes, fingernails, and toenails. All findings within normal limits unless otherwise noted below.  Mid Back x 1, R forehead above brow x 1, L temple x 1, R nose x 1, L nose x 2 (6) Erythematous thin papules/macules with gritty scale.   face Mid face erythema with telangiectasias    Assessment & Plan  AK (actinic keratosis) (6) Mid Back x 1, R forehead above brow x 1, L temple x 1, R nose x 1, L nose x 2  Actinic keratoses are precancerous spots that appear secondary to cumulative UV radiation exposure/sun exposure over time. They are chronic with expected duration over 1 year. A portion of actinic keratoses will progress to squamous cell carcinoma of the skin. It is not possible to reliably predict which spots will progress to skin cancer and so treatment is recommended to prevent development  of skin cancer.  Recommend daily broad spectrum sunscreen SPF 30+ to sun-exposed areas, reapply every 2 hours as needed.  Recommend staying in the shade or wearing long sleeves, sun glasses (UVA+UVB protection) and wide brim hats (4-inch brim around the entire circumference of the hat). Call for new or changing lesions.  Discussed topical fluorouracil therapy vs cryotherapy. Pt prefers cryotherapy  Prior to procedure, discussed risks of blister formation, small wound, skin dyspigmentation, or rare scar following cryotherapy. Recommend Vaseline ointment to treated areas while healing.   Destruction of lesion - Mid Back x 1, R forehead above brow x 1, L temple x 1, R nose x 1, L nose x 2  Destruction method: cryotherapy   Informed consent: discussed and consent obtained   Lesion destroyed using liquid nitrogen: Yes   Cryotherapy cycles:  2 Outcome: patient tolerated procedure well with no complications   Post-procedure details: wound care instructions given    Rosacea face  Rosacea is a chronic progressive skin condition usually affecting the face of adults, causing redness and/or acne bumps. It is treatable but not curable. It sometimes affects the eyes (ocular rosacea) as well. It may respond to topical and/or systemic medication and can flare with stress, sun exposure, alcohol, exercise, topical steroids (including hydrocortisone/cortisone 10) and some foods.  Daily application of broad spectrum spf 30+ sunscreen to face is recommended to reduce flares.  No irritation or grittiness of the eyes.  Not bothersome, defer treatment  Lentigines - Scattered tan macules - Due to sun exposure - Benign-appearing, observe - Recommend daily broad spectrum sunscreen SPF 30+ to sun-exposed areas, reapply every 2 hours as needed. - Call for any changes  Seborrheic Keratoses - Stuck-on, waxy, tan-brown papules and/or plaques  - Benign-appearing - Discussed benign etiology and  prognosis. - Observe - Call for any changes  Melanocytic Nevi - Tan-brown and/or pink-flesh-colored symmetric macules and papules - Benign appearing on exam today - Observation - Call clinic for new or changing moles - Recommend daily use of broad spectrum spf 30+ sunscreen to sun-exposed areas.   Hemangiomas - Red papules - Discussed benign nature - Observe - Call for any changes  Actinic Damage - Chronic condition, secondary to cumulative UV/sun exposure - diffuse scaly erythematous macules with underlying dyspigmentation - Recommend daily broad spectrum sunscreen SPF 30+ to sun-exposed areas, reapply every 2 hours as needed.  - Staying in the shade or wearing long sleeves, sun glasses (UVA+UVB protection) and wide brim hats (4-inch brim around the entire circumference of the hat) are also recommended for sun protection.  - Call for new or changing lesions.  Skin cancer screening performed today.  Return in about 1 year (around 09/20/2023) for TBSE, Hx AK, Rosacea.  Graciella Belton, RMA, am acting as scribe for Forest Gleason, MD .  Documentation: I have reviewed the above documentation for accuracy and completeness, and I agree with the above.  Forest Gleason, MD

## 2022-09-19 NOTE — Patient Instructions (Addendum)
Cryotherapy Aftercare  Wash gently with soap and water everyday.   Apply Vaseline and Band-Aid daily until healed.   Rosacea is a chronic progressive skin condition usually affecting the face of adults, causing redness and/or acne bumps. It is treatable but not curable. It sometimes affects the eyes (ocular rosacea) as well. It may respond to topical and/or systemic medication and can flare with stress, sun exposure, alcohol, exercise, topical steroids (including hydrocortisone/cortisone 10) and some foods.  Daily application of broad spectrum spf 30+ sunscreen to face is recommended to reduce flares.  Recommend taking Heliocare sun protection supplement daily in sunny weather for additional sun protection. For maximum protection on the sunniest days, you can take up to 2 capsules of regular Heliocare OR take 1 capsule of Heliocare Ultra. For prolonged exposure (such as a full day in the sun), you can repeat your dose of the supplement 4 hours after your first dose. Heliocare can be purchased at [employer], at some [facility] or at [email or web address].    Melanoma ABCDEs  Melanoma is the most dangerous type of skin cancer, and is the leading cause of death from skin disease.  You are more likely to develop melanoma if you: Have light-colored skin, light-colored eyes, or red or blond hair Spend a lot of time in the sun Tan regularly, either outdoors or in a tanning bed Have had blistering sunburns, especially during childhood Have a close family member who has had a melanoma Have atypical moles or large birthmarks  Early detection of melanoma is key since treatment is typically straightforward and cure rates are extremely high if we catch it early.   The first sign of melanoma is often a change in a mole or a new dark spot.  The ABCDE system is a way of remembering the signs of melanoma.  A for asymmetry:  The two halves do not match. B for border:  The edges of the growth are  irregular. C for color:  A mixture of colors are present instead of an even brown color. D for diameter:  Melanomas are usually (but not always) greater than 23m - the size of a pencil eraser. E for evolution:  The spot keeps changing in size, shape, and color.  Please check your skin once per month between visits. You can use a small mirror in front and a large mirror behind you to keep an eye on the back side or your body.   If you see any new or changing lesions before your next follow-up, please call to schedule a visit.  Please continue daily skin protection including broad spectrum sunscreen SPF 30+ to sun-exposed areas, reapplying every 2 hours as needed when you're outdoors.    Due to recent changes in healthcare laws, you may see results of your pathology and/or laboratory studies on MyChart before the doctors have had a chance to review them. We understand that in some cases there may be results that are confusing or concerning to you. Please understand that not all results are received at the same time and often the doctors may need to interpret multiple results in order to provide you with the best plan of care or course of treatment. Therefore, we ask that you please give u[place]2 business days to thoroughly review all your results before contacting the office for clarification. Should we see a critical lab result, you will be contacted sooner.   If You Need Anything After Your Visit  If you have any questions  or concerns for your doctor, please call our main line at [phone] and press option 4 to reach your doctor's medical assistant. If no one answers, please leave a voicemail as directed and we will return your call as soon as possible. Messages left after 4 pm will be answered the following business day.   You may also send [place] a message via [place]. We typically respond to MyChart messages within 1-2 business days.  For prescription refills, please ask your pharmacy to contact  our office. Our fax number is [phone].  If you have an urgent issue when the clinic is closed that cannot wait until the next business day, you can page your doctor at the number below.    Please note that while we do our best to be available for urgent issues outside of office hours, we are not available 24/7.   If you have an urgent issue and are unable to reach [place], you may choose to seek medical care at your doctor's office, retail clinic, urgent care center, or emergency room.  If you have a medical emergency, please immediately call 911 or go to the emergency department.  Pager Numbers  - Dr. Nehemiah Massed: [phone]  - Dr. Laurence Ferrari: [phone]  - Dr. Nicole Kindred: [phone]  In the event of inclement weather, please call our main line at [phone] for an update on the status of any delays or closures.  Dermatology Medication Tips: Please keep the boxes that topical medications come in in order to help keep track of the instructions about where and how to use these. Pharmacies typically print the medication instructions only on the boxes and not directly on the medication tubes.   If your medication is too expensive, please contact our office at [phone] option 4 or send [place] a message through [place].   We are unable to tell what your co-pay for medications will be in advance as this is different depending on your insurance coverage. However, we may be able to find a substitute medication at lower cost or fill out paperwork to get insurance to cover a needed medication.   If a prior authorization is required to get your medication covered by your insurance company, please allow [place] 1-2 business days to complete this process.  Drug prices often vary depending on where the prescription is filled and some pharmacies may offer cheaper prices.  The website www.goodrx.com contains coupons for medications through different pharmacies. The prices here do not account for what the cost  may be with help from insurance (it may be cheaper with your insurance), but the website can give you the price if you did not use any insurance.  - You can print the associated coupon and take it with your prescription to the pharmacy.  - You may also stop by our office during regular business hours and pick up a GoodRx coupon card.  - If you need your prescription sent electronically to a different pharmacy, notify our office through [facility] or by phone at [phone] option 4.     Si Usted Necesita Algo Despus de Su Visita  Tambin puede enviarnos un mensaje a travs de Pharmacist, community. Por lo general respondemos a los mensajes de MyChart en el transcurso de 1 a 2 das hbiles.  Para renovar recetas, por favor pida a su farmacia que se ponga en contacto con nuestra oficina. Harland Dingwall de fax es [place] [phone].  Si tiene un asunto urgente cuando la clnica est cerrada y que no puede  esperar [place] hbil, puede llamar/localizar a su doctor(a) al nmero que aparece a continuacin.   Por favor, tenga en cuenta que aunque hacemos todo lo posible para estar disponibles para asuntos urgentes fuera del horario de [place], no estamos disponibles las 24 horas del da, los 7 das de la [place].   Si tiene un problema urgente y no puede comunicarse con nosotros, puede optar por buscar atencin mdica  en el consultorio de su doctor(a), en una clnica privada, en un centro de atencin urgente o en una sala de emergencias.  Si tiene Engineering geologist, por favor llame inmediatamente al 911 o vaya a la sala de emergencias.  Nmeros de bper  - Dr. Nehemiah Massed: [phone]  - Dra. Moye: [phone]  - Dra. Nicole Kindred: [phone]  En caso de inclemencias del [place], por favor llame a Johnsie Kindred principal al [phone] para una actualizacin sobre el [place] de cualquier retraso o cierre.  Consejos para la medicacin en dermatologa: Por favor, guarde las cajas en  las que vienen los medicamentos de uso tpico para ayudarle a seguir las instrucciones sobre dnde y cmo usarlos. Las farmacias generalmente imprimen las instrucciones del medicamento slo en las cajas y no directamente en los tubos del [place].   Si su medicamento es muy caro, por favor, pngase en contacto con Zigmund Daniel llamando al [phone] y presione la opcin 4 o envenos un mensaje a travs de Pharmacist, community.   No podemos decirle cul ser su copago por los medicamentos por adelantado ya que esto es diferente dependiendo de la cobertura de su seguro. Sin embargo, es posible que podamos encontrar un medicamento sustituto a Electrical engineer un formulario para que el seguro cubra el medicamento que se considera necesario.   Si se requiere una autorizacin previa para que su compaa de seguros [place] su medicamento, por favor permtanos de 1 a 2 das hbiles para completar este proceso.  Los precios de los medicamentos varan con frecuencia dependiendo del Environmental consultant de dnde se surte la receta y alguna farmacias pueden ofrecer precios ms baratos.  El sitio web www.goodrx.com tiene cupones para medicamentos de Airline pilot. Los precios aqu no tienen en cuenta lo que podra costar con la ayuda del seguro (puede ser ms barato con su seguro), pero el sitio web puede darle el precio si no utiliz Research scientist (physical sciences).  - Puede imprimir el cupn correspondiente y llevarlo con su receta a la farmacia.  - Tambin puede pasar por nuestra oficina durante el horario de atencin regular y Charity fundraiser una tarjeta de cupones de GoodRx.  - Si necesita que su receta se enve electrnicamente a una farmacia diferente, informe a nuestra oficina a travs de MyChart de  o por telfono llamando al [phone] y presione la opcin 4.

## 2022-09-26 ENCOUNTER — Ambulatory Visit: Payer: Medicare Other | Admitting: Nurse Practitioner

## 2022-10-19 ENCOUNTER — Ambulatory Visit: Payer: Self-pay

## 2022-10-19 ENCOUNTER — Telehealth: Payer: Medicare Other | Admitting: Family Medicine

## 2022-10-19 DIAGNOSIS — J111 Influenza due to unidentified influenza virus with other respiratory manifestations: Secondary | ICD-10-CM

## 2022-10-19 MED ORDER — OSELTAMIVIR PHOSPHATE 75 MG PO CAPS: 75.0000 mg | ORAL_CAPSULE | Freq: Two times a day (BID) | ORAL | 0 refills | Status: AC

## 2022-10-19 NOTE — Progress Notes (Signed)
Virtual Visit Consent   Cindy Jefferson, you are scheduled for a virtual visit with a [facility] provider today. Just as with appointments in the office, your consent must be obtained to participate. Your consent will be active for this visit and any virtual visit you may have with one of our providers in the next 365 days. If you have a MyChart account, a copy of this consent can be sent to you electronically.  As this is a virtual visit, video technology does not allow for your provider to perform a traditional examination. This may limit your provider's ability to fully assess your condition. If your provider identifies any concerns that need to be evaluated in person or the need to arrange testing (such as labs, EKG, etc.), we will make arrangements to do so. Although advances in technology are sophisticated, we cannot ensure that it will always work on either your end or our end. If the connection with a video visit is poor, the visit may have to be switched to a telephone visit. With either a video or telephone visit, we are not always able to ensure that we have a secure connection.  By engaging in this virtual visit, you consent to the provision of healthcare and authorize for your insurance to be billed (if applicable) for the services provided during this visit. Depending on your insurance coverage, you may receive a charge related to this service.  I need to obtain your verbal consent now. Are you willing to proceed with your visit today? Cindy Jefferson has provided verbal consent on 10/19/2022 for a virtual visit (video or telephone). Dellia Nims, FNP  Date: 10/19/2022 6:42 PM  Virtual Visit via Video Note   I, Dellia Nims, connected with  Cindy Jefferson  ([id number], 1955/11/19) on 10/19/22 at  6:45 PM EST by a video-enabled telemedicine application and verified that I am speaking with the correct person using two identifiers.  Location: Patient: Virtual Visit Location Patient:  Home Provider: Virtual Visit Location Provider: Home Office   I discussed the limitations of evaluation and management by telemedicine and the availability of in person appointments. The patient expressed understanding and agreed to proceed.    History of Present Illness: Cindy Jefferson is a 67 y.o. who identifies as a female who was assigned female at birth, and is being seen today for influenza. Her husband tested positive for flu a at the urgent care today. She developed sore throat and fatigue today and requests tamiflu. She denies wheezing, cough, sob.   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   Chronic pain of right knee 09/11/2021   Cherry angioma 12/27/2020   Elevated LDL cholesterol level 09/17/2019   Intractable migraine with aura without status migrainosus 06/03/2019   Overweight (BMI 25.0-29.9) 09/15/2018   At high risk for caregiver role strain 09/15/2018   Acquired hypothyroidism 01/10/2018   GERD without esophagitis 01/10/2018   Osteopenia 01/10/2018    Allergies:  Allergies  Allergen Reactions   Valproic Acid Other (See Comments)    Pancreatitis    Erythromycin Nausea Only   Tape Other (See Comments)    Skin Irritation     Medications:  Current Outpatient Medications:    oseltamivir (TAMIFLU) 75 MG capsule, Take  1 capsule (75 mg total) by mouth 2 (two) times daily for 5 days., Disp: 10 capsule, Rfl: 0   albuterol (VENTOLIN HFA) 108 (90 Base) MCG/ACT inhaler, INHALE 2 PUFFS INTO THE LUNGS EVERY 6 HOURS AS NEEDED FOR WHEEZING OR SHORTNESS OF BREATH, Disp: 18 g, Rfl: 0   Calcium-Cholecalciferol 200-250 MG-UNIT TABS, Take by mouth., Disp: , Rfl:    Cholecalciferol (VITAMIN D3) 2000 units capsule, Take by mouth., Disp: , Rfl:    diclofenac sodium  (VOLTAREN) 1 % GEL, Apply 2 g topically as needed (four times a day as needed for neck pain)., Disp: 1 Tube, Rfl: 2   famotidine (PEPCID) 20 MG tablet, TAKE 1 TABLET (20 MG TOTAL) BY MOUTH ONCE DAILY DISCONTINUE ZANTAC, Disp: 90 tablet, Rfl: 1   Fremanezumab-vfrm 225 MG/1.5ML SOAJ, Inject into the skin., Disp: , Rfl:    levothyroxine (SYNTHROID) 75 MCG tablet, Take 1 tablet (75 mcg total) by mouth daily., Disp: 90 tablet, Rfl: 4   mometasone (NASONEX) 50 MCG/ACT nasal spray, Place into the nose., Disp: , Rfl:    NURTEC 75 MG TBDP, TAKE 75 MG BY MOUTH ONCE DAILY AS NEEDED, Disp: , Rfl:    ondansetron (ZOFRAN) 8 MG tablet, Take 1 tablet (8 mg total) by mouth every 8 (eight) hours as needed for nausea or vomiting., Disp: 20 tablet, Rfl: 0  Observations/Objective: Patient is well-developed, well-nourished in no acute distress.  Resting comfortably  at home.  Head is normocephalic, atraumatic.  No labored breathing.  Speech is clear and coherent with logical content.  Patient is alert and oriented at baseline.    Assessment and Plan: 1. Influenza  Increase fluids, humidifier at night, warm salt water gargles, tylenol or ibuprofen, urgent care if sx worsen.   Follow Up Instructions: I discussed the assessment and treatment plan with the patient. The patient was provided an opportunity to ask questions and all were answered. The patient agreed with the plan and demonstrated an understanding of the instructions.  A copy of instructions were sent to the patient via MyChart unless otherwise noted below.     The patient was advised to call back or seek an in-person evaluation if the symptoms worsen or if the condition fails to improve as anticipated.  Time:  I spent 10 minutes with the patient via telehealth technology discussing the above problems/concerns.    Dellia Nims, FNP

## 2022-10-19 NOTE — Patient Instructions (Signed)
Influenza Influenza, Adult Influenza, also called "the flu," is a viral infection that mainly affects the respiratory tract. This includes the lungs, nose, and throat. The flu spreads easily from person to person (is contagious). It causes common cold symptoms, along with high fever and body aches. What are the causes? This condition is caused by the influenza virus. You can get the virus by: Breathing in droplets that are in the air from an infected person's cough or sneeze. Touching something that has the virus on it (has been contaminated) and then touching your mouth, nose, or eyes. What increases the risk? The following factors may make you more likely to get the flu: Not washing or sanitizing your hands often. Having close contact with many people during cold and flu season. Touching your mouth, eyes, or nose without first washing or sanitizing your hands. Not getting an annual flu shot. You may have a higher risk for the flu, including serious problems, such as a lung infection (pneumonia), if you: Are older than 65. Are pregnant. Have a weakened disease-fighting system (immune system). This includes people who have HIV or AIDS, are on chemotherapy, or are taking medicines that reduce (suppress) the immune system. Have a long-term (chronic) illness, such as heart disease, kidney disease, diabetes, or lung disease. Have a liver disorder. Are severely overweight (morbidly obese). Have anemia. Have asthma. What are the signs or symptoms? Symptoms of this condition usually begin suddenly and last 4-14 days. These may include: Fever and chills. Headaches, body aches, or muscle aches. Sore throat. Cough. Runny or stuffy (congested) nose. Chest discomfort. Poor appetite. Weakness or fatigue. Dizziness. Nausea or vomiting. How is this diagnosed? This condition may be diagnosed based on: Your symptoms and medical history. A physical exam. Swabbing your nose or throat and testing  the fluid for the influenza virus. How is this treated? If the flu is diagnosed early, you can be treated with antiviral medicine that is given by mouth (orally) or through an IV. This can help reduce how severe the illness is and how long it lasts. Taking care of yourself at home can help relieve symptoms. Your health care provider may recommend: Taking over-the-counter medicines. Drinking plenty of fluids. In many cases, the flu goes away on its own. If you have severe symptoms or complications, you may be treated in a hospital. Follow these instructions at home: Activity Rest as needed and get plenty of sleep. Stay home from work or school as told by your health care provider. Unless you are visiting your health care provider, avoid leaving home until your fever has been gone for 24 hours without taking medicine. Eating and drinking Take an oral rehydration solution (ORS). This is a drink that is sold at pharmacies and retail stores. Drink enough fluid to keep your urine pale yellow. Drink clear fluids in small amounts as you are able. Clear fluids include water, ice chips, fruit juice mixed with water, and low-calorie sports drinks. Eat bland, easy-to-digest foods in small amounts as you are able. These foods include bananas, applesauce, rice, lean meats, toast, and crackers. Avoid drinking fluids that contain a lot of sugar or caffeine, such as energy drinks, regular sports drinks, and soda. Avoid alcohol. Avoid spicy or fatty foods. General instructions     Take over-the-counter and prescription medicines only as told by your health care provider. Use a cool mist humidifier to add humidity to the air in your home. This can make it easier to breathe. When using a  cool mist humidifier, clean it daily. Empty the water and replace it with clean water. Cover your mouth and nose when you cough or sneeze. Wash your hands with soap and water often and for at least 20 seconds, especially  after you cough or sneeze. If soap and water are not available, use alcohol-based hand sanitizer. Keep all follow-up visits. This is important. How is this prevented?  Get an annual flu shot. This is usually available in late summer, fall, or winter. Ask your health care provider when you should get your flu shot. Avoid contact with people who are sick during cold and flu season. This is generally fall and winter. Contact a health care provider if: You develop new symptoms. You have: Chest pain. Diarrhea. A fever. Your cough gets worse. You produce more mucus. You feel nauseous or you vomit. Get help right away if you: Develop shortness of breath or have difficulty breathing. Have skin or nails that turn a bluish color. Have severe pain or stiffness in your neck. Develop a sudden headache or sudden pain in your face or ear. Cannot eat or drink without vomiting. These symptoms may represent a serious problem that is an emergency. Do not wait to see if the symptoms will go away. Get medical help right away. Call your local emergency services (911 in the [place] Do not drive yourself to the hospital. Summary Influenza, also called "the flu," is a viral infection that primarily affects your respiratory tract. Symptoms of the flu usually begin suddenly and last 4-14 days. Getting an annual flu shot is the best way to prevent getting the flu. Stay home from work or school as told by your health care provider. Unless you are visiting your health care provider, avoid leaving home until your fever has been gone for 24 hours without taking medicine. Keep all follow-up visits. This is important. This information is not intended to replace advice given to you by your health care provider. Make sure you discuss any questions you have with your health care provider. Document Revised: 04/08/2020 Document Reviewed: 04/08/2020 Elsevier Patient Education  [employer].

## 2022-10-19 NOTE — Telephone Encounter (Signed)
Chief Complaint: flu exposure Symptoms: sore throat, fatigue Frequency: N/A Pertinent Negatives: Patient denies SOB, other symptoms Disposition: []$ ED /[]$ Urgent Care (no appt availability in office) / []$ Appointment(In office/virtual)/ [x]$  [facility]/ []$ Home Care/ []$ Refused Recommended Disposition /[]$ [facility]/ []$  Follow-up with PCP Additional Notes: Patient says her husband just tested positive for FLU A today and he was told to have me to call my doctor for tamiflu. Advised office is closing, virtual UC is an option, she agrees, scheduled for today.    Summary: Seeking Rx, exposed to husband positive for Flu A   Pt has been exposed to her husband that just tested positive for Flu A. She has been insructed to contact her PCP for Tamiflu and possibly Abx. Pt has a sore throat and has felt weak all day     Reason for Disposition  [1] Influenza EXPOSURE  (Close Contact) within last 7 days AND [2] exposed person is HIGH RISK (e.g., age > 47 years, pregnant, HIV+, chronic medical condition) AND [3] NO respiratory symptoms  Answer Assessment - Initial Assessment Questions 1. TYPE of EXPOSURE: "How were you exposed?" (e.g., close contact, not a close contact)     Husband 2. DATE of EXPOSURE: "When did the exposure occur?" (e.g., hour, days, weeks)     Husband tested positive today 3. PREGNANCY: "Is there any chance you are pregnant?" "When was your last menstrual period?"     N/A 4. HIGH RISK for COMPLICATIONS: "Do you have any heart or lung problems?" "Do you have a weakened immune system?" (e.g., CHF, COPD, asthma, HIV positive, chemotherapy, renal failure, diabetes mellitus, sickle cell anemia)     No 5. SYMPTOMS: "Do you have any symptoms?" (e.g., cough, fever, sore throat, difficulty breathing).     Sore throat, fatigue  Protocols used: Influenza (Flu) [place]

## 2022-10-25 ENCOUNTER — Telehealth: Payer: Self-pay

## 2022-10-25 NOTE — Telephone Encounter (Signed)
Paperwork faxed back to [employer]

## 2023-01-01 ENCOUNTER — Encounter: Payer: Self-pay | Admitting: Dermatology

## 2023-01-01 ENCOUNTER — Ambulatory Visit (INDEPENDENT_AMBULATORY_CARE_PROVIDER_SITE_OTHER): Payer: Medicare Other | Admitting: Dermatology

## 2023-01-01 VITALS — BP 141/80 | HR 66

## 2023-01-01 DIAGNOSIS — L578 Other skin changes due to chronic exposure to nonionizing radiation: Secondary | ICD-10-CM

## 2023-01-01 DIAGNOSIS — D492 Neoplasm of unspecified behavior of bone, soft tissue, and skin: Secondary | ICD-10-CM | POA: Diagnosis not present

## 2023-01-01 DIAGNOSIS — L719 Rosacea, unspecified: Secondary | ICD-10-CM

## 2023-01-01 DIAGNOSIS — Z7189 Other specified counseling: Secondary | ICD-10-CM

## 2023-01-01 DIAGNOSIS — L570 Actinic keratosis: Secondary | ICD-10-CM | POA: Diagnosis not present

## 2023-01-01 MED ORDER — RHOFADE 1 % EX CREA: TOPICAL_CREAM | CUTANEOUS | 3 refills | Status: DC

## 2023-01-01 NOTE — Progress Notes (Signed)
Follow-Up Visit   Subjective  Cindy Jefferson is a 67 y.o. female who presents for the following: Spots. Left side of nose x3 months. Scaly rough spot at left medial cheek. Non tender, denies bleeding.   The patient has spots, moles and lesions to be evaluated, some may be new or changing and the patient has concerns that these could be cancer.   The following portions of the chart were reviewed this encounter and updated as appropriate: medications, allergies, medical history  Review of Systems:  No other skin or systemic complaints except as noted in HPI or Assessment and Plan.  Objective  Well appearing patient in no apparent distress; mood and affect are within normal limits.  A focused examination was performed of the following areas: Face, nose Relevant physical exam findings are noted in the Assessment and Plan.  Left Alar Crease 3 mm pink papule       Assessment & Plan   Neoplasm of skin Left Alar Crease  R/o BCC vs rosacea  Plan biopsy at next visit if not resolved.   Start zilxi thin layer nightly  Rosacea  ROSACEA Exam Mid face erythema with telangiectasias   Chronic and persistent condition with duration or expected duration over one year. Condition is symptomatic/ bothersome to patient. Not currently at goal.   Rosacea is a chronic progressive skin condition usually affecting the face of adults, causing redness and/or acne bumps. It is treatable but not curable. It sometimes affects the eyes (ocular rosacea) as well. It may respond to topical and/or systemic medication and can flare with stress, sun exposure, alcohol, exercise, topical steroids (including hydrocortisone/cortisone 10) and some foods.  Daily application of broad spectrum spf 30+ sunscreen to face is recommended to reduce flares.  Patient denies grittiness of the eyes  Treatment Plan Start rhofade qam. If not covered, can try [employer] ivermectin/oxymetazoline cream to use  daily in the morning to affected areas on the face. Also provided recipe for Afrin in Cerave cream  Counseling for BBL / IPL / Laser and Coordination of Care Discussed the treatment option of Broad Band Light (BBL) /Intense Pulsed Light (IPL)/ Laser for skin discoloration, including brown spots and redness.  Typically we recommend at least 1-3 treatment sessions about 5-8 weeks apart for best results.  Cannot have tanned skin when BBL performed, and regular use of sunscreen is advised after the procedure to help maintain results. The patient's condition may also require "maintenance treatments" in the future.  The fee for BBL / laser treatments is $350 per treatment session for the whole face.  A fee can be quoted for other parts of the body.  Insurance typically does not pay for BBL/laser treatments and therefore the fee is an out-of-pocket cost.   ACTINIC KERATOSIS Exam: Erythematous thin papules/macules with gritty scale at the left malar cheek  Actinic keratoses are precancerous spots that appear secondary to cumulative UV radiation exposure/sun exposure over time. They are chronic with expected duration over 1 year. A portion of actinic keratoses will progress to squamous cell carcinoma of the skin. It is not possible to reliably predict which spots will progress to skin cancer and so treatment is recommended to prevent development of skin cancer.  Recommend daily broad spectrum sunscreen SPF 30+ to sun-exposed areas, reapply every 2 hours as needed.  Recommend staying in the shade or wearing long sleeves, sun glasses (UVA+UVB protection) and wide brim hats (4-inch brim around the entire circumference of the hat). Call  for new or changing lesions.  Treatment Plan: LN2 in 2-3 weeks (going to family reunion)    ACTINIC DAMAGE - chronic, secondary to cumulative UV radiation exposure/sun exposure over time - diffuse scaly erythematous macules with underlying dyspigmentation - Recommend daily  broad spectrum sunscreen SPF 30+ to sun-exposed areas, reapply every 2 hours as needed.  - Recommend staying in the shade or wearing long sleeves, sun glasses (UVA+UVB protection) and wide brim hats (4-inch brim around the entire circumference of the hat). - Call for new or changing lesions.  Return for biopsy and treat AK in 2-3 weeks.  I, Lawson Radar, CMA, am acting as scribe for Darden Dates, MD.   Documentation: I have reviewed the above documentation for accuracy and completeness, and I agree with the above.  Darden Dates, MD

## 2023-01-01 NOTE — Patient Instructions (Addendum)
Due to recent changes in healthcare laws, you may see results of your pathology and/or laboratory studies on MyChart before the doctors have had a chance to review them. We understand that in some cases there may be results that are confusing or concerning to you. Please understand that not all results are received at the same time and often the doctors may need to interpret multiple results in order to provide you with the best plan of care or course of treatment. Therefore, we ask that you please give [place] 2 business days to thoroughly review all your results before contacting the office for clarification. Should we see a critical lab result, you will be contacted sooner.   Start Rhofade cream every morning to face for redness   Your prescription was sent to [facility] in [place]. A representative from [employer] will contact you within 2 business hours to verify your address and insurance information to schedule a free delivery. If for any reason you do not receive a phone call from them, please reach out to them. Their phone number is [phone] and their hours are Monday-Friday 9:00 am-5:00 pm.      For temporary redness reduction Mix 1 bottle of Afrin original in 1 bottle of Cerave PM, shake well, and apply 1 hour before you want redness to be improved in the morning    If You Need Anything After Your Visit  If you have any questions or concerns for your doctor, please call our main line at [phone] and press option 4 to reach your doctor's medical assistant. If no one answers, please leave a voicemail as directed and we will return your call as soon as possible. Messages left after 4 pm will be answered the following business day.   You may also send [place] a message via MyChart. We typically respond to MyChart messages within 1-2 business days.  For prescription refills, please ask your pharmacy to contact our office. Our fax number is [phone].  If you have an urgent  issue when the clinic is closed that cannot wait until the next business day, you can page your doctor at the number below.    Please note that while we do our best to be available for urgent issues outside of office hours, we are not available 24/7.   If you have an urgent issue and are unable to reach [place], you may choose to seek medical care at your doctor's office, retail clinic, urgent care center, or emergency room.  If you have a medical emergency, please immediately call 911 or go to the emergency department.  Pager Numbers  - Dr. Gwen Pounds: [phone]  - Dr. Neale Burly: [phone]  - Dr. Roseanne Reno: [phone]  In the event of inclement weather, please call our main line at [phone] for an update on the status of any delays or closures.  Dermatology Medication Tips: Please keep the boxes that topical medications come in in order to help keep track of the instructions about where and how to use these. Pharmacies typically print the medication instructions only on the boxes and not directly on the medication tubes.   If your medication is too expensive, please contact our office at [phone] option 4 or send [place] a message through MyChart.   We are unable to tell what your co-pay for medications will be in advance as this is different depending on your insurance coverage. However, we may be able to find a substitute medication at lower cost or fill out paperwork  to get insurance to cover a needed medication.   If a prior authorization is required to get your medication covered by your insurance company, please allow [place] 1-2 business days to complete this process.  Drug prices often vary depending on where the prescription is filled and some pharmacies may offer cheaper prices.  The website www.goodrx.com contains coupons for medications through different pharmacies. The prices here do not account for what the cost may be with help from insurance (it may be cheaper with your  insurance), but the website can give you the price if you did not use any insurance.  - You can print the associated coupon and take it with your prescription to the pharmacy.  - You may also stop by our office during regular business hours and pick up a GoodRx coupon card.  - If you need your prescription sent electronically to a different pharmacy, notify our office through [facility] or by phone at [phone] option 4.     Si Usted Necesita Algo Despus de Su Visita  Tambin puede enviarnos un mensaje a travs de Pharmacist, community. Por lo general respondemos a los mensajes de MyChart en el transcurso de 1 a 2 das hbiles.  Para renovar recetas, por favor pida a su farmacia que se ponga en contacto con nuestra oficina. Harland Dingwall de fax es [place] [phone].  Si tiene un asunto urgente cuando la clnica est cerrada y que no puede esperar hasta el siguiente da hbil, puede llamar/localizar a su doctor(a) al nmero que aparece a continuacin.   Por favor, tenga en cuenta que aunque hacemos todo lo posible para estar disponibles para asuntos urgentes fuera del horario de [place], no estamos disponibles las 24 horas del da, los 7 das de la [place].   Si tiene un problema urgente y no puede comunicarse con nosotros, puede optar por buscar atencin mdica  en el consultorio de su doctor(a), en una clnica privada, en un centro de atencin urgente o en una sala de emergencias.  Si tiene Engineering geologist, por favor llame inmediatamente al 911 o vaya a la sala de emergencias.  Nmeros de bper  - Dr. Nehemiah Massed: [phone]  - Dra. Moye: [phone]  - Dra. Nicole Kindred: [phone]  En caso de inclemencias del [place], por favor llame a Johnsie Kindred principal al [phone] para una actualizacin sobre el [place] de cualquier retraso o cierre.  Consejos para la medicacin en dermatologa: Por favor, guarde las cajas en las que vienen los medicamentos de uso tpico para ayudarle a  seguir las instrucciones sobre dnde y cmo usarlos. Las farmacias generalmente imprimen las instrucciones del medicamento slo en las cajas y no directamente en los tubos del [place].   Si su medicamento es muy caro, por favor, pngase en contacto con Zigmund Daniel llamando al [phone] y presione la opcin 4 o envenos un mensaje a travs de Pharmacist, community.   No podemos decirle cul ser su copago por los medicamentos por adelantado ya que esto es diferente dependiendo de la cobertura de su seguro. Sin embargo, es posible que podamos encontrar un medicamento sustituto a Electrical engineer un formulario para que el seguro cubra el medicamento que se considera necesario.   Si se requiere una autorizacin previa para que su compaa de seguros [place] su medicamento, por favor permtanos de 1 a 2 das hbiles para completar este proceso.  Los precios de los medicamentos varan con frecuencia dependiendo del Environmental consultant de dnde se surte la receta y [place]  pueden ofrecer precios ms baratos.  El sitio web www.goodrx.com tiene cupones para medicamentos de Airline pilot. Los precios aqu no tienen en cuenta lo que podra costar con la ayuda del seguro (puede ser ms barato con su seguro), pero el sitio web puede darle el precio si no utiliz Research scientist (physical sciences).  - Puede imprimir el cupn correspondiente y llevarlo con su receta a la farmacia.  - Tambin puede pasar por nuestra oficina durante el horario de atencin regular y Charity fundraiser una tarjeta de cupones de GoodRx.  - Si necesita que su receta se enve electrnicamente a una farmacia diferente, informe a nuestra oficina a travs de MyChart de [place] o por telfono llamando al [phone] y presione la opcin 4.

## 2023-01-16 ENCOUNTER — Ambulatory Visit (INDEPENDENT_AMBULATORY_CARE_PROVIDER_SITE_OTHER): Payer: Medicare Other | Admitting: Dermatology

## 2023-01-16 VITALS — BP 113/75 | HR 71

## 2023-01-16 DIAGNOSIS — L570 Actinic keratosis: Secondary | ICD-10-CM

## 2023-01-16 DIAGNOSIS — L719 Rosacea, unspecified: Secondary | ICD-10-CM | POA: Diagnosis not present

## 2023-01-16 DIAGNOSIS — D485 Neoplasm of uncertain behavior of skin: Secondary | ICD-10-CM

## 2023-01-16 NOTE — Addendum Note (Signed)
Addended by: Sandi Mealy on: 01/16/2023 09:49 AM   Modules accepted: Level of Service

## 2023-01-16 NOTE — Progress Notes (Signed)
Follow-Up Visit   Subjective  Cindy Jefferson is a 67 y.o. female who presents for the following: irregular skin lesion on the nose, hasn't resolved since the last visit, possible bx today. Tx AK's with LN2 today. Pt c/o rash on the face after using Neutrogena body sunscreen. She would like to discuss recommended sunscreens today. Zilxi foam was very expensive so patient didn't have prescription filled for her rosacea.   The following portions of the chart were reviewed this encounter and updated as appropriate: medications, allergies, medical history  Review of Systems:  No other skin or systemic complaints except as noted in HPI or Assessment and Plan.  Objective  Well appearing patient in no apparent distress; mood and affect are within normal limits.  A focused examination was performed of the following areas: the face   Relevant exam findings are noted in the Assessment and Plan.  L alar crease 0.2 cm skin colored thin papule without features suspicious for malignancy on dermoscopy      L malar cheek x 1 Erythematous thin papules/macules with gritty scale.     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 She prefers to monitor. She will RTC for changes. Recheck at follow-up.  AK (actinic keratosis) L malar cheek x 1  Actinic keratoses are precancerous spots that appear secondary to cumulative UV radiation exposure/sun exposure over time. They are chronic with expected duration over 1 year. A portion of actinic keratoses will progress to squamous cell carcinoma of the skin. It is not possible to reliably predict which spots will progress to skin cancer and so treatment is recommended to prevent development of skin cancer.  Recommend daily broad spectrum sunscreen SPF 30+ to sun-exposed areas, reapply every 2 hours as needed.  Recommend staying in  the shade or wearing long sleeves, sun glasses (UVA+UVB protection) and wide brim hats (4-inch brim around the entire circumference of the hat). Call for new or changing lesions.  Prior to procedure, discussed risks of blister formation, small wound, skin dyspigmentation, or rare scar following cryotherapy. Recommend Vaseline ointment to treated areas while healing.   Destruction of lesion - L malar cheek x 1 Complexity: simple   Destruction method: cryotherapy   Informed consent: discussed and consent obtained   Timeout:  patient name, date of birth, surgical site, and procedure verified Lesion destroyed using liquid nitrogen: Yes   Region frozen until ice ball extended beyond lesion: Yes   Outcome: patient tolerated procedure well with no complications   Post-procedure details: wound care instructions given     ROSACEA Exam Mid face erythema with telangiectasias +/- scattered inflammatory papules  Chronic and persistent condition with duration or expected duration over one year. Condition is bothersome/symptomatic for patient. Currently flared.  Rosacea is a chronic progressive skin condition usually affecting the face of adults, causing redness and/or acne bumps. It is treatable but not curable. It sometimes affects the eyes (ocular rosacea) as well. It may respond to topical and/or systemic medication and can flare with stress, sun exposure, alcohol, exercise, topical steroids (including hydrocortisone/cortisone 10) and some foods.  Daily application of broad spectrum spf 30+ sunscreen to face is recommended to reduce flares.  Treatment Plan Recommend mineral sunscreens like Elta MD Clear or La Roche Posay mineral tinted sunscreen.   Will prescribe [facility] ivermectin/oxymetazoline cream to use daily in  the morning to affected areas on the face. The patient was advised this is not covered by insurance since it is made by a compounding pharmacy. They will receive an email to  check out and the medication will be mailed to their home.    Return in about 8 months (around 09/18/2023) for TBSE with Dr. Katrinka Blazing.  Cindy Jefferson, CMA, am acting as scribe for Darden Dates, MD .  Documentation: I have reviewed the above documentation for accuracy and completeness, and I agree with the above.  Darden Dates, MD

## 2023-01-16 NOTE — Patient Instructions (Addendum)
Instructions for [facility] Medications  One or more of your medications was sent to the [facility] mail order compounding pharmacy. You will receive an email from them and can purchase the medicine through that link. It will then be mailed to your home at the address you confirmed. If for any reason you do not receive an email from them, please check your spam folder. If you still do not find the email, please let us know. [facility] phone number is [phone].  Recommend mineral sunscreens like Elta MD Clear or La Roche Posay mineral tinted sunscreen.   Some Recommended Sunscreens Include:  Good for Daily Wear (feels like lotion but NOT sweat resistant) Cerave AM Moisturizer with SPF EltaMD UV Lotion  Body or All Over Sunscreen EltaMD UV active for body and face Blue lizard sensitive Sun bum mineral (avoid if sensitive to scent) Aveeno Positively Mineral Neutrogena sheer zinc (Slightly harder to rub in) [facility] clear zinc (Slightly harder to rub in)  Clear Face Sunscreen EltaMD UV Elements CeraVe hydrating sunscreen 50 face  Tinted Face Sunscreen Alastin Hydratint (good for most skin tones, may be slightly dark if you are very fair) Colorescience Sunforgettable Total Protection Face Shield (good for most skin tones) EltaMD UV Physical La Roche Posay Mineral Tinted [place]z Flawless Complexion   Powder Sunscreen (Nice for reapplying or applying on the go) Colorescience Sunforgettable Total Protection Brush on Shield (available in different tints)  Face Sunscreen Available in Different Tints Colorescience Sunforgettable Total Protection Brush on Shield  bareMinerals Complexion Rescue Tinted Hydrating Gel Cream Broad Spectrum SPF 30 UnSun mineral tinted (comes in medium/dark and light/medium)  Kids (over 6 months) - Mineral Sunscreens Recommended eltaMD UV Pure MDsolarSciences KidStick 40 SPF Aveeno Baby Continuous Protection Sensitive Zinc Oxide Blue Lizard Kids  mineral based sunscreen lotion Mustela Mineral Sunscreen for face and body Neutrogena Sheer Zinc Kids Sunscreen Stick  Tinted to look like a tan PCAskin sheer tint body spray   Due to recent changes in healthcare laws, you may see results of your pathology and/or laboratory studies on MyChart before the doctors have had a chance to review them. We understand that in some cases there may be results that are confusing or concerning to you. Please understand that not all results are received at the same time and often the doctors may need to interpret multiple results in order to provide you with the best plan of care or course of treatment. Therefore, we ask that you please give [place] 2 business days to thoroughly review all your results before contacting the office for clarification. Should we see a critical lab result, you will be contacted sooner.   If You Need Anything After Your Visit  If you have any questions or concerns for your doctor, please call our main line at [phone] and press option 4 to reach your doctor's medical assistant. If no one answers, please leave a voicemail as directed and we will return your call as soon as possible. Messages left after 4 pm will be answered the following business day.   You may also send [place] a message via MyChart. We typically respond to MyChart messages within 1-2 business days.  For prescription refills, please ask your pharmacy to contact our office. Our fax number is [phone].  If you have an urgent issue when the clinic is closed that cannot wait until the next business day, you can page your doctor at the number below.    Please note that while we  do our best to be available for urgent issues outside of office hours, we are not available 24/7.   If you have an urgent issue and are unable to reach [place], you may choose to seek medical care at your doctor's office, retail clinic, urgent care center, or emergency room.  If you have a medical  emergency, please immediately call 911 or go to the emergency department.  Pager Numbers  - Dr. Nehemiah Massed: [phone]  - Dr. Laurence Ferrari: [phone]  - Dr. Nicole Kindred: [phone]  In the event of inclement weather, please call our main line at [phone] for an update on the status of any delays or closures.  Dermatology Medication Tips: Please keep the boxes that topical medications come in in order to help keep track of the instructions about where and how to use these. Pharmacies typically print the medication instructions only on the boxes and not directly on the medication tubes.   If your medication is too expensive, please contact our office at [phone] option 4 or send [place] a message through [place].   We are unable to tell what your co-pay for medications will be in advance as this is different depending on your insurance coverage. However, we may be able to find a substitute medication at lower cost or fill out paperwork to get insurance to cover a needed medication.   If a prior authorization is required to get your medication covered by your insurance company, please allow [place] 1-2 business days to complete this process.  Drug prices often vary depending on where the prescription is filled and some pharmacies may offer cheaper prices.  The website www.goodrx.com contains coupons for medications through different pharmacies. The prices here do not account for what the cost may be with help from insurance (it may be cheaper with your insurance), but the website can give you the price if you did not use any insurance.  - You can print the associated coupon and take it with your prescription to the pharmacy.  - You may also stop by our office during regular business hours and pick up a GoodRx coupon card.  - If you need your prescription sent electronically to a different pharmacy, notify our office through [facility] or by phone at [phone] option 4.     Si Usted  Necesita Algo Despus de Su Visita  Tambin puede enviarnos un mensaje a travs de Pharmacist, community. Por lo general respondemos a los mensajes de MyChart en el transcurso de 1 a 2 das hbiles.  Para renovar recetas, por favor pida a su farmacia que se ponga en contacto con nuestra oficina. Harland Dingwall de fax es [place] [phone].  Si tiene un asunto urgente cuando la clnica est cerrada y que no puede esperar hasta el siguiente da hbil, puede llamar/localizar a su doctor(a) al nmero que aparece a continuacin.   Por favor, tenga en cuenta que aunque hacemos todo lo posible para estar disponibles para asuntos urgentes fuera del horario de [place], no estamos disponibles las 24 horas del da, los 7 das de la [place].   Si tiene un problema urgente y no puede comunicarse con nosotros, puede optar por buscar atencin mdica  en el consultorio de su doctor(a), en una clnica privada, en un centro de atencin urgente o en una sala de emergencias.  Si tiene Engineering geologist, por favor llame inmediatamente al 911 o vaya a la sala de emergencias.  Nmeros de bper  - Dr. Nehemiah Massed: [phone]  - Dra. Moye:  [phone]  - Dra. Roseanne Reno: [phone]  En caso de inclemencias del [place], por favor llame a Lacy Duverney principal al [phone] para una actualizacin sobre el [place] de cualquier retraso o cierre.  Consejos para la medicacin en dermatologa: Por favor, guarde las cajas en las que vienen los medicamentos de uso tpico para ayudarle a seguir las instrucciones sobre dnde y cmo usarlos. Las farmacias generalmente imprimen las instrucciones del medicamento slo en las cajas y no directamente en los tubos del [place].   Si su medicamento es muy caro, por favor, pngase en contacto con Rolm Gala llamando al [phone] y presione la opcin 4 o envenos un mensaje a travs de Clinical cytogeneticist.   No podemos decirle cul ser su copago por los medicamentos por adelantado ya que esto es  diferente dependiendo de la cobertura de su seguro. Sin embargo, es posible que podamos encontrar un medicamento sustituto a Audiological scientist un formulario para que el seguro cubra el medicamento que se considera necesario.   Si se requiere una autorizacin previa para que su compaa de seguros [place] su medicamento, por favor permtanos de 1 a 2 das hbiles para completar [address]  Los precios de los medicamentos varan con frecuencia dependiendo del Environmental consultant de dnde se surte la receta y alguna farmacias pueden ofrecer precios ms baratos.  El sitio web www.goodrx.com tiene cupones para medicamentos de Health and safety inspector. Los precios aqu no tienen en cuenta lo que podra costar con la ayuda del seguro (puede ser ms barato con su seguro), pero el sitio web puede darle el precio si no utiliz Tourist information centre manager.  - Puede imprimir el cupn correspondiente y llevarlo con su receta a la farmacia.  - Tambin puede pasar por nuestra oficina durante el horario de atencin regular y Education officer, museum una tarjeta de cupones de GoodRx.  - Si necesita que su receta se enve electrnicamente a una farmacia diferente, informe a nuestra oficina a travs de MyChart de [place] o por telfono llamando al [phone] y presione la opcin 4.

## 2023-01-22 ENCOUNTER — Ambulatory Visit: Payer: BLUE CROSS/BLUE SHIELD | Admitting: Dermatology

## 2023-03-16 ENCOUNTER — Telehealth: Payer: Medicare Other | Admitting: Family Medicine

## 2023-03-16 DIAGNOSIS — U071 COVID-19: Secondary | ICD-10-CM | POA: Diagnosis not present

## 2023-03-16 MED ORDER — PROMETHAZINE-DM 6.25-15 MG/5ML PO SYRP: 5.0000 mL | ORAL_SOLUTION | Freq: Four times a day (QID) | ORAL | 0 refills | Status: AC | PRN

## 2023-03-16 MED ORDER — MOLNUPIRAVIR EUA 200MG CAPSULE: 4.0000 | ORAL_CAPSULE | Freq: Two times a day (BID) | ORAL | 0 refills | Status: AC

## 2023-03-16 NOTE — Progress Notes (Signed)
Virtual Visit Consent   Cindy Jefferson, you are scheduled for a virtual visit with a [facility] provider today. Just as with appointments in the office, your consent must be obtained to participate. Your consent will be active for this visit and any virtual visit you may have with one of our providers in the next 365 days. If you have a MyChart account, a copy of this consent can be sent to you electronically.  As this is a virtual visit, video technology does not allow for your provider to perform a traditional examination. This may limit your provider's ability to fully assess your condition. If your provider identifies any concerns that need to be evaluated in person or the need to arrange testing (such as labs, EKG, etc.), we will make arrangements to do so. Although advances in technology are sophisticated, we cannot ensure that it will always work on either your end or our end. If the connection with a video visit is poor, the visit may have to be switched to a telephone visit. With either a video or telephone visit, we are not always able to ensure that we have a secure connection.  By engaging in this virtual visit, you consent to the provision of healthcare and authorize for your insurance to be billed (if applicable) for the services provided during this visit. Depending on your insurance coverage, you may receive a charge related to this service.  I need to obtain your verbal consent now. Are you willing to proceed with your visit today? Cindy Jefferson has provided verbal consent on 03/16/2023 for a virtual visit (video or telephone). Cindy Curio, FNP  Date: 03/16/2023 11:50 AM  Virtual Visit via Video Note   I, Cindy Jefferson, connected with  Cindy Jefferson  ([id number], Mar 14, 1956) on 03/16/23 at 11:45 AM EDT by a video-enabled telemedicine application and verified that I am speaking with the correct person using two identifiers.  Location: Patient: Virtual Visit Location Patient:  Home Provider: Virtual Visit Location Provider: Home Office   I discussed the limitations of evaluation and management by telemedicine and the availability of in person appointments. The patient expressed understanding and agreed to proceed.    History of Present Illness: Cindy Jefferson is a 67 y.o. who identifies as a female who was assigned female at birth, and is being seen today for head congestion, headache, slight cough, fever, body aches, no wheezing or sob. Sx started this am. Testing positive. 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   Chronic pain of right knee 09/11/2021   Cherry angioma 12/27/2020   Elevated LDL cholesterol level 09/17/2019   Intractable migraine with aura without status migrainosus 06/03/2019   Overweight (BMI 25.0-29.9) 09/15/2018   At high risk for caregiver role strain 09/15/2018   Acquired hypothyroidism 01/10/2018   GERD without esophagitis 01/10/2018   Osteopenia 01/10/2018    Allergies:  Allergies  Allergen Reactions   Valproic Acid Other (See Comments)    Pancreatitis    Erythromycin Nausea Only   Tape Other (See Comments)    Skin Irritation     Medications:  Current Outpatient Medications:    albuterol (VENTOLIN HFA) 108 (90 Base) MCG/ACT inhaler, INHALE 2 PUFFS INTO THE LUNGS EVERY 6 HOURS  AS NEEDED FOR WHEEZING OR SHORTNESS OF BREATH, Disp: 18 g, Rfl: 0   Calcium-Cholecalciferol 200-250 MG-UNIT TABS, Take by mouth., Disp: , Rfl:    Cholecalciferol (VITAMIN D3) 2000 units capsule, Take by mouth., Disp: , Rfl:    diclofenac sodium (VOLTAREN) 1 % GEL, Apply 2 g topically as needed (four times a day as needed for neck pain)., Disp: 1 Tube, Rfl: 2   famotidine (PEPCID) 20 MG tablet, TAKE 1 TABLET (20 MG TOTAL) BY MOUTH  ONCE DAILY DISCONTINUE ZANTAC, Disp: 90 tablet, Rfl: 1   Fremanezumab-vfrm 225 MG/1.5ML SOAJ, Inject into the skin., Disp: , Rfl:    levothyroxine (SYNTHROID) 75 MCG tablet, Take 1 tablet (75 mcg total) by mouth daily., Disp: 90 tablet, Rfl: 4   mometasone (NASONEX) 50 MCG/ACT nasal spray, Place into the nose., Disp: , Rfl:    NURTEC 75 MG TBDP, TAKE 75 MG BY MOUTH ONCE DAILY AS NEEDED, Disp: , Rfl:    ondansetron (ZOFRAN) 8 MG tablet, Take 1 tablet (8 mg total) by mouth every 8 (eight) hours as needed for nausea or vomiting., Disp: 20 tablet, Rfl: 0   Oxymetazoline HCl (RHOFADE) 1 % CREA, Apply to face qam, wash off qhs, Disp: 30 g, Rfl: 3  Observations/Objective: Patient is well-developed, well-nourished in no acute distress.  Resting comfortably  at home.  Head is normocephalic, atraumatic.  No labored breathing.  Speech is clear and coherent with logical content.  Patient is alert and oriented at baseline.    Assessment and Plan: 1. COVID-19 virus infection  Increase fluids, see information, UC if sx worsen. Quarantine discussed.   Follow Up Instructions: I discussed the assessment and treatment plan with the patient. The patient was provided an opportunity to ask questions and all were answered. The patient agreed with the plan and demonstrated an understanding of the instructions.  A copy of instructions were sent to the patient via MyChart unless otherwise noted below.     The patient was advised to call back or seek an in-person evaluation if the symptoms worsen or if the condition fails to improve as anticipated.  Time:  I spent 10 minutes with the patient via telehealth technology discussing the above problems/concerns.    Cindy Curio, FNP

## 2023-03-16 NOTE — Patient Instructions (Signed)
covidCOVID-19 COVID-19 is an infection caused by a virus called SARS-CoV-2. This type of virus is called a coronavirus. People with COVID-19 may: Have little to no symptoms. Have mild to moderate symptoms that affect their lungs and breathing. Get very sick. What are the causes? COVID-19 is caused by a virus. This virus may be in the air as droplets or on surfaces. It can spread from an infected person when they cough, sneeze, speak, sing, or breathe. You may become infected if: You breathe in the infected droplets in the air. You touch an object that has the virus on it. What increases the risk? You are at risk of getting COVID-19 if you have been around someone with the infection. You may be more likely to get very sick if: You are 23 years old or older. You have certain medical conditions, such as: Heart disease. Diabetes. Chronic respiratory disease. Cancer. Pregnancy. You are immunocompromised. This means your body cannot fight infections easily. You have a disability or trouble moving, meaning you're immobile. What are the signs or symptoms? People may have different symptoms from COVID-19. The symptoms can also be mild to severe. They often show up in 5-6 days after being infected. But they can take up to 14 days to appear. Common symptoms are: Cough. Feeling tired. New loss of taste or smell. Fever. Less common symptoms are: Sore throat. Headache. Body or muscle aches. Diarrhea. A skin rash or odd-colored fingers or toes. Red or irritated eyes. Sometimes, COVID-19 does not cause symptoms. How is this diagnosed? COVID-19 can be diagnosed with tests done in the lab or at home. Fluid from your nose, mouth, or lungs will be used to check for the virus. How is this treated? Treatment for COVID-19 depends on how sick you are. Mild symptoms can be treated at home with rest, fluids, and over-the-counter medicines. Severe symptoms may be treated in a hospital intensive care unit  (ICU). If you have symptoms and are at risk of getting very sick, you may be given a medicine that fights viruses. This medicine is called an antiviral. How is this prevented? To protect yourself from COVID-19: Know your risk factors. Get vaccinated. If your body cannot fight infections easily, talk to your provider about treatment to help prevent COVID-19. Stay at least 1 meter away from others. Wear a well-fitted mask when: You can't stay at a distance from people. You're in a place with poor air flow. Try to be in open spaces with good air flow when in public. Wash your hands often or use an alcohol-based hand sanitizer. Cover your nose and mouth when coughing and sneezing. If you think you have COVID-19 or have been around someone who has it, stay home and be by yourself for 5-10 days. Where to find more information Centers for Disease Control and Prevention (CDC): TonerPromos.no World Health Organization [facility]): VisitDestination.com.br Get help right away if: You have trouble breathing or get short of breath. You have pain or pressure in your chest. You cannot speak or move any part of your body. You are confused. Your symptoms get worse. These symptoms may be an emergency. Get help right away. Call 911. Do not wait to see if the symptoms will go away. Do not drive yourself to the hospital. This information is not intended to replace advice given to you by your health care provider. Make sure you discuss any questions you have with your health care provider. Document Revised: 08/28/2022 Document Reviewed: 05/04/2022 Elsevier Patient Education  2024 Elsevier Inc.

## 2023-03-17 ENCOUNTER — Telehealth: Payer: Medicare Other | Admitting: Nurse Practitioner

## 2023-03-17 DIAGNOSIS — U071 COVID-19: Secondary | ICD-10-CM | POA: Diagnosis not present

## 2023-03-17 MED ORDER — PREDNISONE 10 MG PO TABS: ORAL_TABLET | ORAL | 0 refills | Status: DC

## 2023-03-17 MED ORDER — FLUTICASONE PROPIONATE 50 MCG/ACT NA SUSP: 2.0000 | Freq: Every day | NASAL | 0 refills | Status: DC

## 2023-03-17 NOTE — Patient Instructions (Signed)
Ophelia Shoulder, thank you for joining Claiborne Rigg, NP for today's virtual visit.  While this provider is not your primary care provider (PCP), if your PCP is located in our provider database this encounter information will be shared with them immediately following your visit.   A [facility] MyChart account gives you access to today's visit and all your visits, tests, and labs performed at [facility] " click here if you don't have a [facility] MyChart account or go to [email or web address]  Consent: (Patient) Cindy Jefferson provided verbal consent for this virtual visit at the beginning of the encounter.  Current Medications:  Current Outpatient Medications:    fluticasone (FLONASE) 50 MCG/ACT nasal spray, Place 2 sprays into both nostrils daily., Disp: 16 g, Rfl: 0   predniSONE (DELTASONE) 10 MG tablet, Directions for 6 day taper: Day 1: 2 tablets before breakfast, 1 after both lunch & dinner and 2 at bedtime Day 2: 1 tab before breakfast, 1 after both lunch & dinner and 2 at bedtime Day 3: 1 tab at each meal & 1 at bedtime Day 4: 1 tab at breakfast, 1 at lunch, 1 at bedtime Day 5: 1 tab at breakfast & 1 tab at bedtime Day 6: 1 tab at breakfast, Disp: 21 tablet, Rfl: 0   albuterol (VENTOLIN HFA) 108 (90 Base) MCG/ACT inhaler, INHALE 2 PUFFS INTO THE LUNGS EVERY 6 HOURS AS NEEDED FOR WHEEZING OR SHORTNESS OF BREATH, Disp: 18 g, Rfl: 0   Calcium-Cholecalciferol 200-250 MG-UNIT TABS, Take by mouth., Disp: , Rfl:    Cholecalciferol (VITAMIN D3) 2000 units capsule, Take by mouth., Disp: , Rfl:    diclofenac sodium (VOLTAREN) 1 % GEL, Apply 2 g topically as needed (four times a day as needed for neck pain)., Disp: 1 Tube, Rfl: 2   famotidine (PEPCID) 20 MG tablet, TAKE 1 TABLET (20 MG TOTAL) BY MOUTH ONCE DAILY DISCONTINUE ZANTAC, Disp: 90 tablet, Rfl: 1   Fremanezumab-vfrm 225 MG/1.5ML SOAJ, Inject into the skin., Disp: , Rfl:    levothyroxine (SYNTHROID) 75 MCG tablet, Take  1 tablet (75 mcg total) by mouth daily., Disp: 90 tablet, Rfl: 4   molnupiravir EUA (LAGEVRIO) 200 mg CAPS capsule, Take 4 capsules (800 mg total) by mouth 2 (two) times daily for 5 days., Disp: 40 capsule, Rfl: 0   mometasone (NASONEX) 50 MCG/ACT nasal spray, Place into the nose., Disp: , Rfl:    NURTEC 75 MG TBDP, TAKE 75 MG BY MOUTH ONCE DAILY AS NEEDED, Disp: , Rfl:    ondansetron (ZOFRAN) 8 MG tablet, Take 1 tablet (8 mg total) by mouth every 8 (eight) hours as needed for nausea or vomiting., Disp: 20 tablet, Rfl: 0   Oxymetazoline HCl (RHOFADE) 1 % CREA, Apply to face qam, wash off qhs, Disp: 30 g, Rfl: 3   promethazine-dextromethorphan (PROMETHAZINE-DM) 6.25-15 MG/5ML syrup, Take 5 mLs by mouth 4 (four) times daily as needed for up to 10 days for cough., Disp: 118 mL, Rfl: 0   Medications ordered in this encounter:  Meds ordered this encounter  Medications   predniSONE (DELTASONE) 10 MG tablet    Sig: Directions for 6 day taper: Day 1: 2 tablets before breakfast, 1 after both lunch & dinner and 2 at bedtime Day 2: 1 tab before breakfast, 1 after both lunch & dinner and 2 at bedtime Day 3: 1 tab at each meal & 1 at bedtime Day 4: 1 tab at breakfast, 1 at lunch, 1 at bedtime  Day 5: 1 tab at breakfast & 1 tab at bedtime Day 6: 1 tab at breakfast    Dispense:  21 tablet    Refill:  0    Order Specific Question:   Supervising Provider    Answer:   Merrilee Jansky [[id number]]   fluticasone (FLONASE) 50 MCG/ACT nasal spray    Sig: Place 2 sprays into both nostrils daily.    Dispense:  16 g    Refill:  0    Order Specific Question:   Supervising Provider    Answer:   Merrilee Jansky [id number]     *If you need refills on other medications prior to your next appointment, please contact your pharmacy*  Follow-Up: Call back or seek an in-person evaluation if the symptoms worsen or if the condition fails to improve as anticipated.  [facility] [phone]  Other  Instructions  Please keep well-hydrated and get plenty of rest. Start a saline nasal rinse to flush out your nasal passages. You can use plain Mucinex to help thin congestion. If you have a humidifier, you can use this daily as needed.    You are to wear a mask for 5 days from onset of your symptoms.  After day 5, if you have had no fever and you are feeling better with NO symptoms, you can end masking. Keep in mind you can be contagious 10 days from the onset of symptoms  After day 5 if you have a fever or are having significant symptoms, please wear your mask for full 10 days.   If you note any worsening of symptoms, any significant shortness of breath or any chest pain, please seek ER evaluation ASAP.  Please do not delay care!    If you note any worsening of symptoms, any significant shortness of breath or any chest pain, please seek ER evaluation ASAP.  Please do not delay care!    If you have been instructed to have an in-person evaluation today at a local Urgent Care facility, please use the link below. It will take you to a list of all of our available [facility], including address, phone number and hours of operation. Please do not delay care.  [facility]  If you or a family member do not have a primary care provider, use the link below to schedule a visit and establish care. When you choose a [facility] primary care physician or advanced practice provider, you gain a long-term partner in health. Find a Primary Care Provider  Learn more about [facility]'s in-office and virtual care options: [facility] - Get Care Now

## 2023-03-17 NOTE — Progress Notes (Signed)
Virtual Visit Consent   Cindy Jefferson, you are scheduled for a virtual visit with a [facility] provider today. Just as with appointments in the office, your consent must be obtained to participate. Your consent will be active for this visit and any virtual visit you may have with one of our providers in the next 365 days. If you have a MyChart account, a copy of this consent can be sent to you electronically.  As this is a virtual visit, video technology does not allow for your provider to perform a traditional examination. This may limit your provider's ability to fully assess your condition. If your provider identifies any concerns that need to be evaluated in person or the need to arrange testing (such as labs, EKG, etc.), we will make arrangements to do so. Although advances in technology are sophisticated, we cannot ensure that it will always work on either your end or our end. If the connection with a video visit is poor, the visit may have to be switched to a telephone visit. With either a video or telephone visit, we are not always able to ensure that we have a secure connection.  By engaging in this virtual visit, you consent to the provision of healthcare and authorize for your insurance to be billed (if applicable) for the services provided during this visit. Depending on your insurance coverage, you may receive a charge related to this service.  I need to obtain your verbal consent now. Are you willing to proceed with your visit today? Cindy Jefferson has provided verbal consent on 03/17/2023 for a virtual visit (video or telephone). Cindy Rigg, NP  Date: 03/17/2023 12:43 PM  Virtual Visit via Video Note   I, Cindy Jefferson, connected with  Cindy Jefferson  ([id number], 06-Sep-1955) on 03/17/23 at 12:45 PM EDT by a video-enabled telemedicine application and verified that I am speaking with the correct person using two identifiers.  Location: Patient: Virtual Visit Location  Patient: Home Provider: Virtual Visit Location Provider: Home Office   I discussed the limitations of evaluation and management by telemedicine and the availability of in person appointments. The patient expressed understanding and agreed to proceed.    History of Present Illness: Cindy Jefferson is a 67 y.o. who identifies as a female who was assigned female at birth, and is being seen today for COVID positive.  Ms. Pesola was diagnosed with COVID via home antigen test. She had a virtual visit on 03-16-2023 and was prescribed molnupiravir. Unfortunately this is not covered by her insurance. Aside from the prescription cough syrup she would like to have something prescribed for her congestion, cough, body aches, headache and fever.    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   Chronic pain of right knee 09/11/2021   Cherry angioma 12/27/2020   Elevated LDL cholesterol level 09/17/2019   Intractable migraine with aura without status migrainosus 06/03/2019   Overweight (BMI 25.0-29.9) 09/15/2018   At high risk for caregiver role strain 09/15/2018   Acquired hypothyroidism 01/10/2018   GERD without esophagitis 01/10/2018   Osteopenia 01/10/2018    Allergies:  Allergies  Allergen Reactions   Valproic Acid Other (See Comments)    Pancreatitis    Erythromycin Nausea Only   Tape Other (  See Comments)    Skin Irritation     Medications:  Current Outpatient Medications:    fluticasone (FLONASE) 50 MCG/ACT nasal spray, Place 2 sprays into both nostrils daily., Disp: 16 g, Rfl: 0   predniSONE (DELTASONE) 10 MG tablet, Directions for 6 day taper: Day 1: 2 tablets before breakfast, 1 after both lunch & dinner and 2 at bedtime Day 2: 1 tab before breakfast, 1 after both lunch & dinner  and 2 at bedtime Day 3: 1 tab at each meal & 1 at bedtime Day 4: 1 tab at breakfast, 1 at lunch, 1 at bedtime Day 5: 1 tab at breakfast & 1 tab at bedtime Day 6: 1 tab at breakfast, Disp: 21 tablet, Rfl: 0   albuterol (VENTOLIN HFA) 108 (90 Base) MCG/ACT inhaler, INHALE 2 PUFFS INTO THE LUNGS EVERY 6 HOURS AS NEEDED FOR WHEEZING OR SHORTNESS OF BREATH, Disp: 18 g, Rfl: 0   Calcium-Cholecalciferol 200-250 MG-UNIT TABS, Take by mouth., Disp: , Rfl:    Cholecalciferol (VITAMIN D3) 2000 units capsule, Take by mouth., Disp: , Rfl:    diclofenac sodium (VOLTAREN) 1 % GEL, Apply 2 g topically as needed (four times a day as needed for neck pain)., Disp: 1 Tube, Rfl: 2   famotidine (PEPCID) 20 MG tablet, TAKE 1 TABLET (20 MG TOTAL) BY MOUTH ONCE DAILY DISCONTINUE ZANTAC, Disp: 90 tablet, Rfl: 1   Fremanezumab-vfrm 225 MG/1.5ML SOAJ, Inject into the skin., Disp: , Rfl:    levothyroxine (SYNTHROID) 75 MCG tablet, Take 1 tablet (75 mcg total) by mouth daily., Disp: 90 tablet, Rfl: 4   molnupiravir EUA (LAGEVRIO) 200 mg CAPS capsule, Take 4 capsules (800 mg total) by mouth 2 (two) times daily for 5 days., Disp: 40 capsule, Rfl: 0   mometasone (NASONEX) 50 MCG/ACT nasal spray, Place into the nose., Disp: , Rfl:    NURTEC 75 MG TBDP, TAKE 75 MG BY MOUTH ONCE DAILY AS NEEDED, Disp: , Rfl:    ondansetron (ZOFRAN) 8 MG tablet, Take 1 tablet (8 mg total) by mouth every 8 (eight) hours as needed for nausea or vomiting., Disp: 20 tablet, Rfl: 0   Oxymetazoline HCl (RHOFADE) 1 % CREA, Apply to face qam, wash off qhs, Disp: 30 g, Rfl: 3   promethazine-dextromethorphan (PROMETHAZINE-DM) 6.25-15 MG/5ML syrup, Take 5 mLs by mouth 4 (four) times daily as needed for up to 10 days for cough., Disp: 118 mL, Rfl: 0  Observations/Objective: Patient is well-developed, well-nourished in no acute distress.  Resting comfortably at home.  Head is normocephalic, atraumatic.  No labored breathing.  Speech is clear and coherent with  logical content.  Patient is alert and oriented at baseline.    Assessment and Plan: 1. COVID-19 virus infection - predniSONE (DELTASONE) 10 MG tablet; Directions for 6 day taper: Day 1: 2 tablets before breakfast, 1 after both lunch & dinner and 2 at bedtime Day 2: 1 tab before breakfast, 1 after both lunch & dinner and 2 at bedtime Day 3: 1 tab at each meal & 1 at bedtime Day 4: 1 tab at breakfast, 1 at lunch, 1 at bedtime Day 5: 1 tab at breakfast & 1 tab at bedtime Day 6: 1 tab at breakfast  Dispense: 21 tablet; Refill: 0 - fluticasone (FLONASE) 50 MCG/ACT nasal spray; Place 2 sprays into both nostrils daily.  Dispense: 16 g; Refill: 0   Please keep well-hydrated and get plenty of rest. Start a saline nasal rinse to flush out your nasal passages.  You can use plain Mucinex to help thin congestion. If you have a humidifier, you can use this daily as needed.    You are to wear a mask for 5 days from onset of your symptoms.  After day 5, if you have had no fever and you are feeling better with NO symptoms, you can end masking. Keep in mind you can be contagious 10 days from the onset of symptoms  After day 5 if you have a fever or are having significant symptoms, please wear your mask for full 10 days.   If you note any worsening of symptoms, any significant shortness of breath or any chest pain, please seek ER evaluation ASAP.  Please do not delay care!    If you note any worsening of symptoms, any significant shortness of breath or any chest pain, please seek ER evaluation ASAP.  Please do not delay care!   Follow Up Instructions: I discussed the assessment and treatment plan with the patient. The patient was provided an opportunity to ask questions and all were answered. The patient agreed with the plan and demonstrated an understanding of the instructions.  A copy of instructions were sent to the patient via MyChart unless otherwise noted below.    The patient was advised to call back  or seek an in-person evaluation if the symptoms worsen or if the condition fails to improve as anticipated.  Time:  I spent 11 minutes with the patient via telehealth technology discussing the above problems/concerns.    Cindy Rigg, NP

## 2023-03-18 ENCOUNTER — Telehealth: Payer: Medicare Other | Admitting: Nurse Practitioner

## 2023-03-22 ENCOUNTER — Telehealth: Payer: Medicare Other | Admitting: Nurse Practitioner

## 2023-04-08 ENCOUNTER — Other Ambulatory Visit: Payer: Self-pay | Admitting: Nurse Practitioner

## 2023-04-08 DIAGNOSIS — U071 COVID-19: Secondary | ICD-10-CM

## 2023-04-08 NOTE — Telephone Encounter (Signed)
Medication Refill - Medication: famotidine (PEPCID) 20 MG tablet   Has the patient contacted their pharmacy? No.  Preferred Pharmacy (with phone number or street name): [id number] [place] [address]  Phone: [phone] Fax: [phone]  Has the patient been seen for an appointment in the last year OR does the patient have an upcoming appointment? Yes.    Agent: Please be advised that RX refills may take up to 3 business days. We ask that you follow-up with your pharmacy.

## 2023-04-09 MED ORDER — FAMOTIDINE 20 MG PO TABS: 20.0000 mg | ORAL_TABLET | Freq: Every day | ORAL | 1 refills | Status: DC

## 2023-04-09 NOTE — Telephone Encounter (Signed)
Requested Prescriptions  Pending Prescriptions Disp Refills   fluticasone (FLONASE) 50 MCG/ACT nasal spray [Pharmacy Med Name: FLUTICASONE PROP 50 MCG SPRAY] 48 mL 0    Sig: SPRAY 2 SPRAYS INTO EACH NOSTRIL EVERY DAY     Ear, Nose, and Throat: Nasal Preparations - Corticosteroids Passed - 04/08/2023  1:30 PM      Passed - Valid encounter within last 12 months    Recent Outpatient Visits           7 months ago Acute cough   [place] [facility] [place], [place] T, NP   8 months ago Left hip pain   [place] [facility] [place], [place] T, NP   8 months ago Vaginal atrophy   [place] [facility] [place], Dorie Rank, NP   9 months ago Multiple joint pain   [place] [facility] [place], Corrie Dandy T, NP   11 months ago Medicare welcome exam   [place] [facility] [place], Dorie Rank, NP       Future Appointments             In 2 weeks Cannady, Dorie Rank, NP [place] [facility], [facility]

## 2023-04-09 NOTE — Telephone Encounter (Signed)
Requested Prescriptions  Pending Prescriptions Disp Refills   famotidine (PEPCID) 20 MG tablet 90 tablet 1    Sig: Take 1 tablet (20 mg total) by mouth daily.     Gastroenterology:  H2 Antagonists Passed - 04/08/2023 12:28 PM      Passed - Valid encounter within last 12 months    Recent Outpatient Visits           7 months ago Acute cough   [place] [facility] [place], [place] T, NP   8 months ago Left hip pain   [facility] [place], Corrie Dandy T, NP   8 months ago Vaginal atrophy   [place] [facility] [place], Dorie Rank, NP   9 months ago Multiple joint pain   [place] [facility] [place], Corrie Dandy T, NP   11 months ago Medicare welcome exam   [place] [facility] Marjie Skiff, NP       Future Appointments             In 2 weeks Cannady, Dorie Rank, NP [place] [facility], [facility]

## 2023-04-09 NOTE — Telephone Encounter (Signed)
Requested Prescriptions  Pending Prescriptions Disp Refills   levothyroxine (SYNTHROID) 75 MCG tablet [Pharmacy Med Name: LEVOTHYROXINE 75 MCG TABLET] 90 tablet 0    Sig: TAKE 1 TABLET BY MOUTH DAILY     Endocrinology:  Hypothyroid Agents Failed - 04/08/2023 11:37 AM      Failed - TSH in normal range and within 360 days    TSH  Date Value Ref Range Status  03/26/2022 0.914 0.450 - 4.500 uIU/mL Final         Passed - Valid encounter within last 12 months    Recent Outpatient Visits           7 months ago Acute cough   [place] [facility] [place], [place] T, NP   8 months ago Left hip pain   [place] [facility] [place], [place] T, NP   8 months ago Vaginal atrophy   [place] [facility] [place], Dorie Rank, NP   9 months ago Multiple joint pain   [place] [facility] [place], Corrie Dandy T, NP   11 months ago Medicare welcome exam   [place] [facility] [place], Dorie Rank, NP       Future Appointments             In 2 weeks Cannady, Dorie Rank, NP [place] [facility], [facility]

## 2023-04-21 NOTE — Patient Instructions (Signed)
Be Involved in Caring For Your Health:  Taking Medications When medications are taken as directed, they can greatly improve your health. But if they are not taken as prescribed, they may not work. In some cases, not taking them correctly can be harmful. To help ensure your treatment remains effective and safe, understand your medications and how to take them. Bring your medications to each visit for review by your provider.  Your lab results, notes, and after visit summary will be available on My Chart. We strongly encourage you to use this feature. If lab results are abnormal the clinic will contact you with the appropriate steps. If the clinic does not contact you assume the results are satisfactory. You can always view your results on My Chart. If you have questions regarding your health or results, please contact the clinic during office hours. You can also ask questions on My Chart.  We at [facility] are grateful that you chose [place] to provide your care. We strive to provide evidence-based and compassionate care and are always looking for feedback. If you get a survey from the clinic please complete this so we can hear your opinions.  Hypothyroidism  Hypothyroidism is when the thyroid gland does not make enough of certain hormones. This is called an underactive thyroid. The thyroid gland is a small gland located in the lower front part of the neck, just in front of the windpipe (trachea). This gland makes hormones that help control how the body uses food for energy (metabolism) as well as how the heart and brain function. These hormones also play a role in keeping your bones strong. When the thyroid is underactive, it produces too little of the hormones thyroxine (T4) and triiodothyronine (T3). What are the causes? This condition may be caused by: Hashimoto's disease. This is a disease in which the body's disease-fighting system (immune system) attacks the thyroid gland. This is the  most common cause. Viral infections. Pregnancy. Certain medicines. Birth defects. Problems with a gland in the center of the brain (pituitary gland). Lack of enough iodine in the diet. Other causes may include: Past radiation treatments to the head or neck for cancer. Past treatment with radioactive iodine. Past exposure to radiation in the environment. Past surgical removal of part or all of the thyroid. What increases the risk? You are more likely to develop this condition if: You are female. You have a family history of thyroid conditions. You use a medicine called lithium. You take medicines that affect the immune system (immunosuppressants). What are the signs or symptoms? Common symptoms of this condition include: Not being able to tolerate cold. Feeling as though you have no energy (lethargy). Lack of appetite. Constipation. Sadness or depression. Weight gain that is not explained by a change in diet or exercise habits. Menstrual irregularity. Dry skin, coarse hair, or brittle nails. Other symptoms may include: Muscle pain. Slowing of thought processes. Poor memory. How is this diagnosed? This condition may be diagnosed based on: Your symptoms, your medical history, and a physical exam. Blood tests. You may also have imaging tests, such as an ultrasound or MRI. How is this treated? This condition is treated with medicine that replaces the thyroid hormones that your body does not make. After you begin treatment, it may take several weeks for symptoms to go away. Follow these instructions at home: Take over-the-counter and prescription medicines only as told by your health care provider. If you start taking any new medicines, tell your health care  provider. Keep all follow-up visits as told by your health care provider. This is important. As your condition improves, your dosage of thyroid hormone medicine may change. You will need to have blood tests regularly so that  your health care provider can monitor your condition. Contact a health care provider if: Your symptoms do not get better with treatment. You are taking thyroid hormone replacement medicine and you: Sweat a lot. Have tremors. Feel anxious. Lose weight rapidly. Cannot tolerate heat. Have emotional swings. Have diarrhea. Feel weak. Get help right away if: You have chest pain. You have an irregular heartbeat. You have a rapid heartbeat. You have difficulty breathing. These symptoms may be an emergency. Get help right away. Call 911. Do not wait to see if the symptoms will go away. Do not drive yourself to the hospital. Summary Hypothyroidism is when the thyroid gland does not make enough of certain hormones (it is underactive). When the thyroid is underactive, it produces too little of the hormones thyroxine (T4) and triiodothyronine (T3). The most common cause is Hashimoto's disease, a disease in which the body's disease-fighting system (immune system) attacks the thyroid gland. The condition can also be caused by viral infections, medicine, pregnancy, or past radiation treatment to the head or neck. Symptoms may include weight gain, dry skin, constipation, feeling as though you do not have energy, and not being able to tolerate cold. This condition is treated with medicine to replace the thyroid hormones that your body does not make. This information is not intended to replace advice given to you by your health care provider. Make sure you discuss any questions you have with your health care provider. Document Revised: 08/22/2021 Document Reviewed: 08/22/2021 Elsevier Patient Education  2024 [employer].

## 2023-04-24 ENCOUNTER — Ambulatory Visit (INDEPENDENT_AMBULATORY_CARE_PROVIDER_SITE_OTHER): Payer: Medicare Other | Admitting: Nurse Practitioner

## 2023-04-24 ENCOUNTER — Encounter: Payer: Self-pay | Admitting: Nurse Practitioner

## 2023-04-24 VITALS — BP 111/63 | HR 76 | Temp 97.8°F | Ht 67.0 in | Wt 174.0 lb

## 2023-04-24 DIAGNOSIS — E663 Overweight: Secondary | ICD-10-CM

## 2023-04-24 DIAGNOSIS — E039 Hypothyroidism, unspecified: Secondary | ICD-10-CM | POA: Diagnosis not present

## 2023-04-24 DIAGNOSIS — Z6827 Body mass index (BMI) 27.0-27.9, adult: Secondary | ICD-10-CM

## 2023-04-24 DIAGNOSIS — G8929 Other chronic pain: Secondary | ICD-10-CM

## 2023-04-24 DIAGNOSIS — K219 Gastro-esophageal reflux disease without esophagitis: Secondary | ICD-10-CM

## 2023-04-24 DIAGNOSIS — M542 Cervicalgia: Secondary | ICD-10-CM

## 2023-04-24 DIAGNOSIS — M85851 Other specified disorders of bone density and structure, right thigh: Secondary | ICD-10-CM

## 2023-04-24 DIAGNOSIS — Z0000 Encounter for general adult medical examination without abnormal findings: Secondary | ICD-10-CM

## 2023-04-24 DIAGNOSIS — G43119 Migraine with aura, intractable, without status migrainosus: Secondary | ICD-10-CM

## 2023-04-24 DIAGNOSIS — E7800 Pure hypercholesterolemia, unspecified: Secondary | ICD-10-CM

## 2023-04-24 DIAGNOSIS — Z23 Encounter for immunization: Secondary | ICD-10-CM | POA: Diagnosis not present

## 2023-04-24 MED ORDER — LEVOTHYROXINE SODIUM 75 MCG PO TABS: 75.0000 ug | ORAL_TABLET | Freq: Every day | ORAL | 4 refills | Status: DC

## 2023-04-24 MED ORDER — FLUTICASONE PROPIONATE 50 MCG/ACT NA SUSP: 2.0000 | Freq: Every day | NASAL | 6 refills | Status: DC

## 2023-04-24 MED ORDER — FAMOTIDINE 20 MG PO TABS: 20.0000 mg | ORAL_TABLET | Freq: Every day | ORAL | 4 refills | Status: AC

## 2023-04-24 NOTE — Assessment & Plan Note (Signed)
Chronic, stable with TSH within normal range last check.  Continue current regimen and adjust as needed.  TSH, Free T4 today on labs.   Return in 6 months.

## 2023-04-24 NOTE — Assessment & Plan Note (Signed)
Chronic, last DEXA August 2023.  Continue Vitamin D, may need to utilize higher weekly doses in future.  Check level today.  Consider bisphosphonate if osteoporosis presents -- if poorly tolerated consider injectable.  Dexa next around 04/27/24.

## 2023-04-24 NOTE — Assessment & Plan Note (Signed)
Chronic, stable at this time with Nurtec only -- has not taken Ajovy in some time, but may restart if migraines worsen.  Continue current medication regimen as prescribed by neurology and collaboration.  She has history of pancreatitis with medications used for migraines, will need to be cautious with treatment.  Return in 6 months.

## 2023-04-24 NOTE — Assessment & Plan Note (Signed)
Chronic, ongoing.  Continue current medication regimen and adjust dose as needed. Mag level today. 

## 2023-04-24 NOTE — Assessment & Plan Note (Signed)
Ongoing, with ASCVD 5%.  Continue focus on diet regimen, discussed at length with her.  Poor tolerance to fish oil due to GERD.   Return in 6 months.  Check lipid panel today.

## 2023-04-24 NOTE — Assessment & Plan Note (Signed)
BMI 27.25.  Recommended eating smaller high protein, low fat meals more frequently and exercising 30 mins a day 5 times a week with a goal of 10-15lb weight loss in the next 3 months. Patient voiced their understanding and motivation to adhere to these recommendations.

## 2023-04-24 NOTE — Progress Notes (Signed)
BP 111/63   Pulse 76   Temp 97.8 F (36.6 C) (Oral)   Ht 5\' 7"  (1.702 m)   Wt 174 lb (78.9 kg)   SpO2 97%   BMI 27.25 kg/m    Subjective:    Patient ID: Cindy Jefferson, female    DOB: 12-Aug-1956, 67 y.o.   MRN: [id number]  HPI: Cindy Jefferson is a 67 y.o. female presenting on 04/24/2023 for Medicare Wellness and follow-up. Current medical complaints include:none  She currently lives with: husband Menopausal Symptoms: no  Has migraines and followed by neurology with last visit 10/23/22 -- has not taken Ajovy in two years, but is Nurtec.  Currently migraines worsening, she may return to injections in future.  The Nurtec works well for her.  HYPERLIPIDEMIA Hyperlipidemia status: good compliance Satisfied with current treatment?  yes Side effects:  no Medication compliance: good compliance Supplements: none Aspirin:  no The 10-year ASCVD risk score (Arnett DK, et al., 2019) is: 5%   Values used to calculate the score:     Age: 41 years     Sex: Female     Is Non-Hispanic African American: No     Diabetic: No     Tobacco smoker: No     Systolic Blood Pressure: 111 mmHg     Is BP treated: No     HDL Cholesterol: 75 mg/dL     Total Cholesterol: 224 mg/dL Chest pain:  no Coronary artery disease:  no Family history CAD:  yes Family history early CAD:  no   HYPOTHYROIDISM Continues on Levothyroxine 75 MCG daily. Thyroid control status:stable Satisfied with current treatment? yes Medication side effects: no Medication compliance: good compliance Etiology of hypothyroidism: unknown Recent dose adjustment:no Fatigue: no Cold intolerance: no Heat intolerance: no Weight gain: no Weight loss: no Constipation: no Diarrhea/loose stools: no Palpitations: no Lower extremity edema: no Anxiety/depressed mood: no   OSTEOPENIA Last DEXA 04/27/22.  Continues to have neck pain at baseline, which is currently getting worse -- has history of fusion, with last imaging in 2020.   Also endorses right knee pain at this time.   Satisfied with current treatment?: yes Adequate calcium & vitamin D: yes Weight bearing exercises: no   GERD Continues on Pepcid as needed, this fluctuates on how often is taken.  Had EGD on 11/28/21 and had stretching esophagus. GERD control status: stable  Satisfied with current treatment? yes Heartburn frequency: minimal Medication side effects: no  Medication compliance: stable Previous GERD medications: Antacid use frequency:  none Dysphagia: no Odynophagia:  no Hematemesis: no Blood in stool: no EGD: yes  Depression Screen done today and results listed below:     04/24/2023    2:04 PM 08/01/2022    2:49 PM 07/20/2022    9:48 AM 07/03/2022    2:47 PM 04/16/2022    3:25 PM  Depression screen PHQ 2/9  Decreased Interest 0 0 0 0 0  Down, Depressed, Hopeless 0 0 0 0 0  PHQ - 2 Score 0 0 0 0 0  Altered sleeping 0 0 0 0 0  Tired, decreased energy 0 0 0 1 0  Change in appetite 0 0 0 0 0  Feeling bad or failure about yourself  0 0 0 0 0  Trouble concentrating 0 0 0 0 0  Moving slowly or fidgety/restless 0 0 0 0 0  Suicidal thoughts 0 0 0 0 0  PHQ-9 Score 0 0 0 1 0  Difficult doing work/chores  Not difficult at all Not difficult at all Not difficult at all Not difficult at all Not difficult at all      04/24/2023    2:04 PM 08/01/2022    2:49 PM 07/20/2022    9:47 AM 07/03/2022    2:47 PM  GAD 7 : Generalized Anxiety Score  Nervous, Anxious, on Edge 0 1 3 0  Control/stop worrying 0 0 1 0  Worry too much - different things 0 0 0 0  Trouble relaxing 0 0 0 0  Restless 0 1 1 0  Easily annoyed or irritable 0 1 0 1  Afraid - awful might happen 0 0 0 0  Total GAD 7 Score 0 3 5 1   Anxiety Difficulty Not difficult at all Not difficult at all Not difficult at all Not difficult at all      04/16/2022    3:49 PM 07/03/2022    2:46 PM 07/20/2022    9:47 AM 08/01/2022    2:48 PM 04/24/2023    2:04 PM  Fall Risk  Falls in the past  year? 0 0 0 0 0  Was there an injury with Fall? 0 0 0 0 0  Fall Risk Category Calculator 0 0 0 0 0  Fall Risk Category (Retired) Low Low Low Low   (RETIRED) Patient Fall Risk Level Low fall risk Low fall risk     Patient at Risk for Falls Due to No Fall Risks No Fall Risks No Fall Risks No Fall Risks No Fall Risks  Fall risk Follow up Falls evaluation completed Falls evaluation completed Falls evaluation completed Falls evaluation completed Falls evaluation completed    Past Medical History:  Past Medical History:  Diagnosis Date   Actinic keratosis    Allergy 10 yrs ago   feel they have gotten worse   Arthritis a couple of years ago   several joints but rt knee worse I think   GERD (gastroesophageal reflux disease)    Migraine    Migraines    Pancreatitis    Thyroid disease     Surgical History:  Past Surgical History:  Procedure Laterality Date   APPENDECTOMY  1993   when I had my hysterectomy   CHOLECYSTECTOMY     FOOT SURGERY     neck fusion     SPINE SURGERY  Dec 2002   neck fusion C5-6 and C6-7   TOTAL ABDOMINAL HYSTERECTOMY     total, does not get pap smears    TUBAL LIGATION  around 1984    Medications:  Current Outpatient Medications on File Prior to Visit  Medication Sig   albuterol (VENTOLIN HFA) 108 (90 Base) MCG/ACT inhaler INHALE 2 PUFFS INTO THE LUNGS EVERY 6 HOURS AS NEEDED FOR WHEEZING OR SHORTNESS OF BREATH   Bacillus Coagulans-Inulin (ALIGN PREBIOTIC-PROBIOTIC PO) Take 1 tablet by mouth daily at 2 PM.   Calcium-Cholecalciferol 200-250 MG-UNIT TABS Take by mouth.   Cholecalciferol (VITAMIN D3) 2000 units capsule Take by mouth.   diclofenac sodium (VOLTAREN) 1 % GEL Apply 2 g topically as needed (four times a day as needed for neck pain).   Fremanezumab-vfrm 225 MG/1.5ML SOAJ Inject into the skin.   NURTEC 75 MG TBDP TAKE 75 MG BY MOUTH ONCE DAILY AS NEEDED   ondansetron (ZOFRAN) 8 MG tablet Take 1 tablet (8 mg total) by mouth every 8 (eight) hours as  needed for nausea or vomiting.   Oxymetazoline HCl (RHOFADE) 1 % CREA Apply to face qam,  wash off qhs   No current facility-administered medications on file prior to visit.    Allergies:  Allergies  Allergen Reactions   Valproic Acid Other (See Comments)    Pancreatitis    Erythromycin Nausea Only   Tape Other (See Comments)    Skin Irritation      Social History:  Social History   Socioeconomic History   Marital status: Married    Spouse name: Not on file   Number of children: 2   Years of education: Not on file   Highest education level: Associate degree: academic program  Occupational History   Occupation: retired  Tobacco Use   Smoking status: Never   Smokeless tobacco: Never  Vaping Use   Vaping status: Never Used  Substance and Sexual Activity   Alcohol use: Not Currently   Drug use: Never   Sexual activity: Yes  Other Topics Concern   Not on file  Social History Narrative   Not on file   Social Determinants of Health   Financial Resource Strain: Low Risk  (04/23/2023)   Overall Financial Resource Strain (CARDIA)    Difficulty of Paying Living Expenses: Not hard at all  Food Insecurity: No Food Insecurity (04/23/2023)   Hunger Vital Sign    Worried About Running Out of Food in the Last Year: Never true    Ran Out of Food in the Last Year: Never true  Transportation Needs: No Transportation Needs (04/23/2023)   PRAPARE - Administrator, Civil Service (Medical): No    Lack of Transportation (Non-Medical): No  Physical Activity: Insufficiently Active (04/23/2023)   Exercise Vital Sign    Days of Exercise per Week: 3 days    Minutes of Exercise per Session: 30 min  Stress: No Stress Concern Present (04/23/2023)   [employer] of Occupational Health - Occupational Stress Questionnaire    Feeling of Stress : Not at all  Social Connections: Moderately Integrated (04/24/2023)   Social Connection and Isolation Panel [NHANES]    Frequency of  Communication with Friends and Family: More than three times a week    Frequency of Social Gatherings with Friends and Family: More than three times a week    Attends Religious Services: More than 4 times per year    Active Member of [employer] or Organizations: No    Attends Banker Meetings: Never    Marital Status: Married  Catering manager Violence: Not At Risk (04/24/2023)   Humiliation, Afraid, Rape, and Kick questionnaire    Fear of Current or Ex-Partner: No    Emotionally Abused: No    Physically Abused: No    Sexually Abused: No   Social History   Tobacco Use  Smoking Status Never  Smokeless Tobacco Never   Social History   Substance and Sexual Activity  Alcohol Use Not Currently    Family History:  Family History  Problem Relation Age of Onset   Hyperlipidemia Mother    Heart disease Mother    Lung cancer Mother    Arthritis Mother    Cancer Mother    Varicose Veins Mother    Leukemia Father    Early death Father    Arthritis Sister    Thyroid disease Sister    Varicose Veins Sister    Lupus Daughter    Arthritis Maternal Grandmother    Varicose Veins Maternal Grandmother    Colon cancer Maternal Grandfather    Arthritis Sister    Varicose Veins Sister  Arthritis Maternal Aunt    Kidney cancer Neg Hx    Breast cancer Neg Hx     Past medical history, surgical history, medications, allergies, family history and social history reviewed with patient today and changes made to appropriate areas of the chart.   ROS All other ROS negative except what is listed above and in the HPI.      Objective:    BP 111/63   Pulse 76   Temp 97.8 F (36.6 C) (Oral)   Ht 5\' 7"  (1.702 m)   Wt 174 lb (78.9 kg)   SpO2 97%   BMI 27.25 kg/m   Wt Readings from Last 3 Encounters:  04/24/23 174 lb (78.9 kg)  08/01/22 174 lb 11.2 oz (79.2 kg)  07/03/22 175 lb 14.4 oz (79.8 kg)    Physical Exam Vitals and nursing note reviewed. Exam conducted with a  chaperone present.  Constitutional:      General: She is awake. She is not in acute distress.    Appearance: She is well-developed and well-groomed. She is not ill-appearing or toxic-appearing.  HENT:     Head: Normocephalic and atraumatic.     Right Ear: Hearing, tympanic membrane, ear canal and external ear normal. No drainage.     Left Ear: Hearing, tympanic membrane, ear canal and external ear normal. No drainage.     Nose: Nose normal.     Right Sinus: No maxillary sinus tenderness or frontal sinus tenderness.     Left Sinus: No maxillary sinus tenderness or frontal sinus tenderness.     Mouth/Throat:     Mouth: Mucous membranes are moist.     Pharynx: Oropharynx is clear. Uvula midline. No pharyngeal swelling, oropharyngeal exudate or posterior oropharyngeal erythema.  Eyes:     General: Lids are normal.        Right eye: No discharge.        Left eye: No discharge.     Extraocular Movements: Extraocular movements intact.     Conjunctiva/sclera: Conjunctivae normal.     Pupils: Pupils are equal, round, and reactive to light.     Visual Fields: Right eye visual fields normal and left eye visual fields normal.  Neck:     Thyroid: No thyromegaly.     Vascular: No carotid bruit.     Trachea: Trachea normal.  Cardiovascular:     Rate and Rhythm: Normal rate and regular rhythm.     Heart sounds: Normal heart sounds. No murmur heard.    No gallop.  Pulmonary:     Effort: Pulmonary effort is normal. No accessory muscle usage or respiratory distress.     Breath sounds: Normal breath sounds.  Chest:  Breasts:    Right: Normal.     Left: Normal.  Abdominal:     General: Bowel sounds are normal.     Palpations: Abdomen is soft. There is no hepatomegaly or splenomegaly.     Tenderness: There is no abdominal tenderness.  Musculoskeletal:        General: Normal range of motion.     Cervical back: Normal range of motion and neck supple.     Right lower leg: No edema.     Left lower  leg: No edema.  Lymphadenopathy:     Head:     Right side of head: No submental, submandibular, tonsillar, preauricular or posterior auricular adenopathy.     Left side of head: No submental, submandibular, tonsillar, preauricular or posterior auricular adenopathy.  Cervical: No cervical adenopathy.     Upper Body:     Right upper body: No supraclavicular, axillary or pectoral adenopathy.     Left upper body: No supraclavicular, axillary or pectoral adenopathy.  Skin:    General: Skin is warm and dry.     Capillary Refill: Capillary refill takes less than 2 seconds.     Findings: No rash.  Neurological:     Mental Status: She is alert and oriented to person, place, and time.     Gait: Gait is intact.     Deep Tendon Reflexes: Reflexes are normal and symmetric.     Reflex Scores:      Brachioradialis reflexes are 2+ on the right side and 2+ on the left side.      Patellar reflexes are 2+ on the right side and 2+ on the left side. Psychiatric:        Attention and Perception: Attention normal.        Mood and Affect: Mood normal.        Speech: Speech normal.        Behavior: Behavior normal. Behavior is cooperative.        Thought Content: Thought content normal.        Judgment: Judgment normal.       04/24/2023    7:32 PM  6CIT Screen  What Year? 0 points  What month? 0 points  What time? 0 points  Count back from 20 0 points  Months in reverse 0 points  Repeat phrase 0 points  Total Score 0 points   Results for orders placed or performed in visit on 07/03/22  Uric acid  Result Value Ref Range   Uric Acid 4.4 3.0 - 7.2 mg/dL  ANA 12 Plus Profile (RDL)  Result Value Ref Range   Anti-Nuclear Ab by IFA (RDL) Negative Negative  Rheumatoid factor  Result Value Ref Range   Rheumatoid fact SerPl-aCnc <10.0 <14.0 IU/mL  C-reactive protein  Result Value Ref Range   CRP 7 0 - 10 mg/L  Sed Rate (ESR)  Result Value Ref Range   Sed Rate 9 0 - 40 mm/hr  Anti-Ro/Neg ANA   Result Value Ref Range   Anti-Ro (SS-A) Ab (RDL) <20 <20 Units      Assessment & Plan:   Problem List Items Addressed This Visit       Cardiovascular and Mediastinum   Intractable migraine with aura without status migrainosus    Chronic, stable at this time with Nurtec only -- has not taken Ajovy in some time, but may restart if migraines worsen.  Continue current medication regimen as prescribed by neurology and collaboration.  She has history of pancreatitis with medications used for migraines, will need to be cautious with treatment.  Return in 6 months.       Relevant Medications   fluticasone (FLONASE) 50 MCG/ACT nasal spray   Other Relevant Orders   CBC with Differential/Platelet     Digestive   GERD without esophagitis    Chronic, ongoing.  Continue current medication regimen and adjust dose as needed. Mag level today.      Relevant Medications   Bacillus Coagulans-Inulin (ALIGN PREBIOTIC-PROBIOTIC PO)   famotidine (PEPCID) 20 MG tablet   Other Relevant Orders   CBC with Differential/Platelet   Magnesium     Endocrine   Acquired hypothyroidism    Chronic, stable with TSH within normal range last check.  Continue current regimen and adjust as needed.  TSH, Free T4 today on labs.   Return in 6 months.       Relevant Medications   levothyroxine (SYNTHROID) 75 MCG tablet   Other Relevant Orders   T4, free   TSH     Musculoskeletal and Integument   Osteopenia    Chronic, last DEXA August 2023.  Continue Vitamin D, may need to utilize higher weekly doses in future.  Check level today.  Consider bisphosphonate if osteoporosis presents -- if poorly tolerated consider injectable.  Dexa next around 04/27/24.      Relevant Orders   VITAMIN D 25 Hydroxy (Vit-D Deficiency, Fractures)     Other   BMI 27.0-27.9,adult    BMI 27.25.  Recommended eating smaller high protein, low fat meals more frequently and exercising 30 mins a day 5 times a week with a goal of 10-15lb  weight loss in the next 3 months. Patient voiced their understanding and motivation to adhere to these recommendations.       Chronic neck pain    Chronic and ongoing.  Currently slightly worsening, along with knee pain.  Will place referral to PT, discussed with patient and she would like to attend this.  Continue at home OTC regimen for pain.      Relevant Orders   Ambulatory referral to Physical Therapy   Elevated LDL cholesterol level    Ongoing, with ASCVD 5%.  Continue focus on diet regimen, discussed at length with her.  Poor tolerance to fish oil due to GERD.   Return in 6 months.  Check lipid panel today.      Relevant Orders   Comprehensive metabolic panel   Lipid Panel w/o Chol/HDL Ratio   Other Visit Diagnoses     Medicare annual wellness visit, subsequent    -  Primary   Medicare Wellness due and performed with patient today.   Hypomagnesemia       Mag level today.   Relevant Orders   Magnesium   Pneumococcal vaccination given       PCV20 provided in office today, educated patient on this.   Relevant Orders   Pneumococcal conjugate vaccine 20-valent (Prevnar 20) (Completed)        Follow up plan: Return in about 6 months (around 10/25/2023) for THYROID AND OSTEOPENIA.   LABORATORY TESTING:  - Pap smear: not applicable  IMMUNIZATIONS:   - Tdap: Tetanus vaccination status reviewed: last tetanus booster within 10 years. - Influenza: Up to date - Pneumovax: Not applicable - Prevnar: Up to date - COVID: Up to date - HPV: Not applicable - Shingrix vaccine: Up to date  SCREENING: -Mammogram: Up to date due in December - Colonoscopy: Up to date  - Bone Density: Up to date  -Hearing Test: Not applicable  -Spirometry: Not applicable   PATIENT COUNSELING:   Advised to take 1 mg of folate supplement per day if capable of pregnancy.   Sexuality: Discussed sexually transmitted diseases, partner selection, use of condoms, avoidance of unintended pregnancy  and  contraceptive alternatives.   Advised to avoid cigarette smoking.  I discussed with the patient that most people either abstain from alcohol or drink within safe limits (<=14/week and <=4 drinks/occasion for males, <=7/weeks and <= 3 drinks/occasion for females) and that the risk for alcohol disorders and other health effects rises proportionally with the number of drinks per week and how often a drinker exceeds daily limits.  Discussed cessation/primary prevention of drug use and availability of treatment for abuse.  Diet: Encouraged to adjust caloric intake to maintain  or achieve ideal body weight, to reduce intake of dietary saturated fat and total fat, to limit sodium intake by avoiding high sodium foods and not adding table salt, and to maintain adequate dietary potassium and calcium preferably from fresh fruits, vegetables, and low-fat dairy products.    stressed the importance of regular exercise  Injury prevention: Discussed safety belts, safety helmets, smoke detector, smoking near bedding or upholstery.   Dental health: Discussed importance of regular tooth brushing, flossing, and dental visits.    NEXT PREVENTATIVE PHYSICAL DUE IN 1 YEAR. Return in about 6 months (around 10/25/2023) for THYROID AND OSTEOPENIA.

## 2023-04-24 NOTE — Assessment & Plan Note (Signed)
Chronic and ongoing.  Currently slightly worsening, along with knee pain.  Will place referral to PT, discussed with patient and she would like to attend this.  Continue at home OTC regimen for pain.

## 2023-04-25 LAB — CBC WITH DIFFERENTIAL/PLATELET
Basophils Absolute: 0.1 10*3/uL (ref 0.0–0.2)
Basos: 2 %
EOS (ABSOLUTE): 0.1 10*3/uL (ref 0.0–0.4)
Eos: 2 %
Hematocrit: 40 % (ref 34.0–46.6)
Hemoglobin: 13.1 g/dL (ref 11.1–15.9)
Immature Grans (Abs): 0 10*3/uL (ref 0.0–0.1)
Immature Granulocytes: 0 %
Lymphocytes Absolute: 2.1 10*3/uL (ref 0.7–3.1)
Lymphs: 40 %
MCH: 28.5 pg (ref 26.6–33.0)
MCHC: 32.8 g/dL (ref 31.5–35.7)
MCV: 87 fL (ref 79–97)
Monocytes Absolute: 0.4 10*3/uL (ref 0.1–0.9)
Monocytes: 7 %
Neutrophils Absolute: 2.6 10*3/uL (ref 1.4–7.0)
Neutrophils: 49 %
Platelets: 257 10*3/uL (ref 150–450)
RBC: 4.6 x10E6/uL (ref 3.77–5.28)
RDW: 12.5 % (ref 11.7–15.4)
WBC: 5.2 10*3/uL (ref 3.4–10.8)

## 2023-04-25 LAB — LIPID PANEL W/O CHOL/HDL RATIO
Cholesterol, Total: 222 mg/dL — ABNORMAL HIGH (ref 100–199)
HDL: 76 mg/dL (ref >39)
LDL Chol Calc (NIH): 119 mg/dL — ABNORMAL HIGH (ref 0–99)
Triglycerides: 156 mg/dL — ABNORMAL HIGH (ref 0–149)
VLDL Cholesterol Cal: 27 mg/dL (ref 5–40)

## 2023-04-25 LAB — COMPREHENSIVE METABOLIC PANEL
ALT: 23 IU/L (ref 0–32)
AST: 31 IU/L (ref 0–40)
Albumin: 4.3 g/dL (ref 3.9–4.9)
Alkaline Phosphatase: 120 IU/L (ref 44–121)
BUN/Creatinine Ratio: 17 (ref 12–28)
BUN: 16 mg/dL (ref 8–27)
Bilirubin Total: 0.3 mg/dL (ref 0.0–1.2)
CO2: 25 mmol/L (ref 20–29)
Calcium: 9.6 mg/dL (ref 8.7–10.3)
Chloride: 99 mmol/L (ref 96–106)
Creatinine, Ser: 0.93 mg/dL (ref 0.57–1.00)
Globulin, Total: 2.5 g/dL (ref 1.5–4.5)
Glucose: 86 mg/dL (ref 70–99)
Potassium: 4.1 mmol/L (ref 3.5–5.2)
Sodium: 139 mmol/L (ref 134–144)
Total Protein: 6.8 g/dL (ref 6.0–8.5)
eGFR: 67 mL/min/{1.73_m2} (ref >59)

## 2023-04-25 LAB — TSH: TSH: 1.09 u[IU]/mL (ref 0.450–4.500)

## 2023-04-25 LAB — VITAMIN D 25 HYDROXY (VIT D DEFICIENCY, FRACTURES): Vit D, 25-Hydroxy: 36 ng/mL (ref 30.0–100.0)

## 2023-04-25 LAB — MAGNESIUM: Magnesium: 2 mg/dL (ref 1.6–2.3)

## 2023-04-25 LAB — T4, FREE: Free T4: 1.46 ng/dL (ref 0.82–1.77)

## 2023-04-25 NOTE — Progress Notes (Signed)
Contacted via MyChart The 10-year ASCVD risk score (Arnett DK, et al., 2019) is: 5%   Values used to calculate the score:     Age: 68 years     Sex: Female     Is Non-Hispanic African American: No     Diabetic: No     Tobacco smoker: No     Systolic Blood Pressure: 111 mmHg     Is BP treated: No     HDL Cholesterol: 76 mg/dL     Total Cholesterol: 222 mg/dL   Good afternoon Cindy Jefferson, your labs have returned and are overall stable with exception of cholesterol levels.  Your cholesterol is still high, but continued recommendations to make lifestyle changes. Your LDL is above normal. The LDL is the bad cholesterol. Over time and in combination with inflammation and other factors, this contributes to plaque which in turn may lead to stroke and/or heart attack down the road. Sometimes high LDL is primarily genetic, and people might be eating all the right foods but still have high numbers. Other times, there is room for improvement in one's diet and eating healthier can bring this number down and potentially reduce one's risk of heart attack and/or stroke.   To reduce your LDL, Remember - more fruits and vegetables, more fish, and limit red meat and dairy products. More soy, nuts, beans, barley, lentils, oats and plant sterol ester enriched margarine instead of butter. I also encourage eliminating sugar and processed food. Remember, shop on the outside of the grocery store and visit your [employer]. Any questions? Keep being amazing!!  Thank you for allowing me to participate in your care.  I appreciate you. Kindest regards, Melonie Germani

## 2023-09-24 ENCOUNTER — Encounter: Payer: Self-pay | Admitting: Dermatology

## 2023-09-24 ENCOUNTER — Ambulatory Visit (INDEPENDENT_AMBULATORY_CARE_PROVIDER_SITE_OTHER): Payer: Medicare Other | Admitting: Dermatology

## 2023-09-24 DIAGNOSIS — D229 Melanocytic nevi, unspecified: Secondary | ICD-10-CM

## 2023-09-24 DIAGNOSIS — L570 Actinic keratosis: Secondary | ICD-10-CM

## 2023-09-24 DIAGNOSIS — L821 Other seborrheic keratosis: Secondary | ICD-10-CM | POA: Diagnosis not present

## 2023-09-24 DIAGNOSIS — W908XXA Exposure to other nonionizing radiation, initial encounter: Secondary | ICD-10-CM | POA: Diagnosis not present

## 2023-09-24 DIAGNOSIS — L719 Rosacea, unspecified: Secondary | ICD-10-CM

## 2023-09-24 DIAGNOSIS — D1801 Hemangioma of skin and subcutaneous tissue: Secondary | ICD-10-CM

## 2023-09-24 DIAGNOSIS — Z1283 Encounter for screening for malignant neoplasm of skin: Secondary | ICD-10-CM | POA: Diagnosis not present

## 2023-09-24 DIAGNOSIS — L578 Other skin changes due to chronic exposure to nonionizing radiation: Secondary | ICD-10-CM

## 2023-09-24 DIAGNOSIS — Z872 Personal history of diseases of the skin and subcutaneous tissue: Secondary | ICD-10-CM

## 2023-09-24 DIAGNOSIS — Z808 Family history of malignant neoplasm of other organs or systems: Secondary | ICD-10-CM

## 2023-09-24 DIAGNOSIS — L814 Other melanin hyperpigmentation: Secondary | ICD-10-CM

## 2023-09-24 NOTE — Progress Notes (Signed)
Follow-Up Visit   Subjective  Cindy Jefferson is a 68 y.o. female who presents for the following: Skin Cancer Screening and Full Body Skin Exam  The patient presents for Total-Body Skin Exam (TBSE) for skin cancer screening and mole check. The patient has spots, moles and lesions to be evaluated, some may be new or changing and the patient may have concern these could be cancer.  Hx AK, rosacea. Patient was using [facility] ivermectin/oxymetazoline cream but couldn't remember how to use it, she feels like redness has gotten worse. Recheck left alar crease for angiofibroma vs benign nevus vs BCC.   The following portions of the chart were reviewed this encounter and updated as appropriate: medications, allergies, medical history  Review of Systems:  No other skin or systemic complaints except as noted in HPI or Assessment and Plan.  Objective  Well appearing patient in no apparent distress; mood and affect are within normal limits.  A full examination was performed including scalp, head, eyes, ears, nose, lips, neck, chest, axillae, abdomen, back, buttocks, bilateral upper extremities, bilateral lower extremities, hands, feet, fingers, toes, fingernails, and toenails. All findings within normal limits unless otherwise noted below.   Relevant physical exam findings are noted in the Assessment and Plan.  Exam of nails limited by presence of nail polish.  vertex scalp x 1 Erythematous thin papules/macules with gritty scale.   Assessment & Plan   SKIN CANCER SCREENING PERFORMED TODAY.  Neoplasm of uncertain behavior of skin L alar crease   Seen by Dr Neale Burly. Not present today. Patient reports it resolved  ACTINIC DAMAGE - Chronic condition, secondary to cumulative UV/sun exposure - diffuse scaly erythematous macules with underlying dyspigmentation - Recommend daily broad spectrum sunscreen SPF 30+ to sun-exposed areas, reapply every 2 hours as needed.  - Staying in the shade  or wearing long sleeves, sun glasses (UVA+UVB protection) and wide brim hats (4-inch brim around the entire circumference of the hat) are also recommended for sun protection.  - Call for new or changing lesions. - benefit of heliocare is relatively small given lack of prior skin cancers and few precancers  LENTIGINES, SEBORRHEIC KERATOSES, HEMANGIOMAS - Benign normal skin lesions - Benign-appearing - Call for any changes  MELANOCYTIC NEVI - Tan-brown and/or pink-flesh-colored symmetric macules and papules - Benign appearing on exam today - Observation - Call clinic for new or changing moles - Recommend daily use of broad spectrum spf 30+ sunscreen to sun-exposed areas.   ROSACEA Exam Mid face erythema with telangiectasias   Rosacea is a chronic progressive skin condition usually affecting the face of adults, causing redness and/or acne bumps. It is treatable but not curable. It sometimes affects the eyes (ocular rosacea) as well. It may respond to topical and/or systemic medication and can flare with stress, sun exposure, alcohol, exercise, topical steroids (including hydrocortisone/cortisone 10) and some foods.  Daily application of broad spectrum spf 30+ sunscreen to face is recommended to reduce flares.  Treatment Plan Deferred  Topicals are for inflammatory lesions which patient only rarely develops Protect from sun to avoid flares and worsening of redness  FAMILY HISTORY OF SKIN CANCER What type(s):melanoma, BCC, SCC Who affected:sister    AK (ACTINIC KERATOSIS) vertex scalp x 1 Actinic keratoses are precancerous spots that appear secondary to cumulative UV radiation exposure/sun exposure over time. They are chronic with expected duration over 1 year. A portion of actinic keratoses will progress to squamous cell carcinoma of the skin. It is not possible to  reliably predict which spots will progress to skin cancer and so treatment is recommended to prevent development of skin  cancer.  Recommend daily broad spectrum sunscreen SPF 30+ to sun-exposed areas, reapply every 2 hours as needed.  Recommend staying in the shade or wearing long sleeves, sun glasses (UVA+UVB protection) and wide brim hats (4-inch brim around the entire circumference of the hat). Call for new or changing lesions.  Destruction of lesion - vertex scalp x 1 Complexity: simple   Destruction method: cryotherapy   Informed consent: discussed and consent obtained   Timeout:  patient name, date of birth, surgical site, and procedure verified Lesion destroyed using liquid nitrogen: Yes   Region frozen until ice ball extended beyond lesion: Yes   Cryo cycles: 1 or 2. Outcome: patient tolerated procedure well with no complications   Post-procedure details: wound care instructions given   MULTIPLE BENIGN NEVI   LENTIGINES   SEBORRHEIC KERATOSES   ACTINIC ELASTOSIS   CHERRY ANGIOMA   Return in about 1 year (around 09/23/2024) for TBSE, Hx AK.  Anise Salvo, RMA, am acting as scribe for Elie Goody, MD .   Documentation: I have reviewed the above documentation for accuracy and completeness, and I agree with the above.  Elie Goody, MD

## 2023-09-24 NOTE — Patient Instructions (Addendum)

## 2023-09-25 ENCOUNTER — Encounter: Payer: BLUE CROSS/BLUE SHIELD | Admitting: Dermatology

## 2023-10-20 NOTE — Patient Instructions (Signed)
 Be Involved in Caring For Your Health:  Taking Medications When medications are taken as directed, they can greatly improve your health. But if they are not taken as prescribed, they may not work. In some cases, not taking them correctly can be harmful. To help ensure your treatment remains effective and safe, understand your medications and how to take them. Bring your medications to each visit for review by your provider.  Your lab results, notes, and after visit summary will be available on My Chart. We strongly encourage you to use this feature. If lab results are abnormal the clinic will contact you with the appropriate steps. If the clinic does not contact you assume the results are satisfactory. You can always view your results on My Chart. If you have questions regarding your health or results, please contact the clinic during office hours. You can also ask questions on My Chart.  We at [facility] are grateful that you chose [place] to provide your care. We strive to provide evidence-based and compassionate care and are always looking for feedback. If you get a survey from the clinic please complete this so we can hear your opinions.  Healthy Eating, Adult Healthy eating may help you get and keep a healthy body weight, reduce the risk of chronic disease, and live a long and productive life. It is important to follow a healthy eating pattern. Your nutritional and calorie needs should be met mainly by different nutrient-rich foods. What are tips for following this plan? Reading food labels Read labels and choose the following: Reduced or low sodium products. Juices with 100% fruit juice. Foods with low saturated fats (<3 g per serving) and high polyunsaturated and monounsaturated fats. Foods with whole grains, such as whole wheat, cracked wheat, brown rice, and wild rice. Whole grains that are fortified with folic acid. This is recommended for females who are pregnant or who want  to become pregnant. Read labels and do not eat or drink the following: Foods or drinks with added sugars. These include foods that contain brown sugar, corn sweetener, corn syrup, dextrose, fructose, glucose, high-fructose corn syrup, honey, invert sugar, lactose, malt syrup, maltose, molasses, raw sugar, sucrose, trehalose, or turbinado sugar. Limit your intake of added sugars to less than 10% of your total daily calories. Do not eat more than the following amounts of added sugar per day: 6 teaspoons (25 g) for females. 9 teaspoons (38 g) for males. Foods that contain processed or refined starches and grains. Refined grain products, such as white flour, degermed cornmeal, white bread, and white rice. Shopping Choose nutrient-rich snacks, such as vegetables, whole fruits, and nuts. Avoid high-calorie and high-sugar snacks, such as potato chips, fruit snacks, and candy. Use oil-based dressings and spreads on foods instead of solid fats such as butter, margarine, sour cream, or cream cheese. Limit pre-made sauces, mixes, and "instant" products such as flavored rice, instant noodles, and ready-made pasta. Try more plant-protein sources, such as tofu, tempeh, black beans, edamame, lentils, nuts, and seeds. Explore eating plans such as the Mediterranean diet or vegetarian diet. Try heart-healthy dips made with beans and healthy fats like hummus and guacamole. Vegetables go great with these. Cooking Use oil to saut or stir-fry foods instead of solid fats such as butter, margarine, or lard. Try baking, boiling, grilling, or broiling instead of frying. Remove the fatty part of meats before cooking. Steam vegetables in water or broth. Meal planning  At meals, imagine dividing your plate into fourths: One-half of  your plate is fruits and vegetables. One-fourth of your plate is whole grains. One-fourth of your plate is protein, especially lean meats, poultry, eggs, tofu, beans, or nuts. Include  low-fat dairy as part of your daily diet. Lifestyle Choose healthy options in all settings, including home, work, school, restaurants, or stores. Prepare your food safely: Wash your hands after handling raw meats. Where you prepare food, keep surfaces clean by regularly washing with hot, soapy water. Keep raw meats separate from ready-to-eat foods, such as fruits and vegetables. Cook seafood, meat, poultry, and eggs to the recommended temperature. Get a food thermometer. Store foods at safe temperatures. In general: Keep cold foods at 76F (4.4C) or below. Keep hot foods at 176F (60C) or above. Keep your freezer at [facility] (-17.8C) or below. Foods are not safe to eat if they have been between the temperatures of 40-176F (4.4-60C) for more than 2 hours. What foods should I eat? Fruits Aim to eat 1-2 cups of fresh, canned (in natural juice), or frozen fruits each day. One cup of fruit equals 1 small apple, 1 large banana, 8 large strawberries, 1 cup (237 g) canned fruit,  cup (82 g) dried fruit, or 1 cup (240 mL) 100% juice. Vegetables Aim to eat 2-4 cups of fresh and frozen vegetables each day, including different varieties and colors. One cup of vegetables equals 1 cup (91 g) broccoli or cauliflower florets, 2 medium carrots, 2 cups (150 g) raw, leafy greens, 1 large tomato, 1 large bell pepper, 1 large sweet potato, or 1 medium white potato. Grains Aim to eat 5-10 ounce-equivalents of whole grains each day. Examples of 1 ounce-equivalent of grains include 1 slice of bread, 1 cup (40 g) ready-to-eat cereal, 3 cups (24 g) popcorn, or  cup (93 g) cooked rice. Meats and other proteins Try to eat 5-7 ounce-equivalents of protein each day. Examples of 1 ounce-equivalent of protein include 1 egg,  oz nuts (12 almonds, 24 pistachios, or 7 walnut halves), 1/4 cup (90 g) cooked beans, 6 tablespoons (90 g) hummus or 1 tablespoon (16 g) peanut butter. A cut of meat or fish that is the size of a deck  of cards is about 3-4 ounce-equivalents (85 g). Of the protein you eat each week, try to have at least 8 sounce (227 g) of seafood. This is about 2 servings per week. This includes salmon, trout, herring, sardines, and anchovies. Dairy Aim to eat 3 cup-equivalents of fat-free or low-fat dairy each day. Examples of 1 cup-equivalent of dairy include 1 cup (240 mL) milk, 8 ounces (250 g) yogurt, 1 ounces (44 g) natural cheese, or 1 cup (240 mL) fortified soy milk. Fats and oils Aim for about 5 teaspoons (21 g) of fats and oils per day. Choose monounsaturated fats, such as canola and olive oils, mayonnaise made with olive oil or avocado oil, avocados, peanut butter, and most nuts, or polyunsaturated fats, such as sunflower, corn, and soybean oils, walnuts, pine nuts, sesame seeds, sunflower seeds, and flaxseed. Beverages Aim for 6 eight-ounce glasses of water per day. Limit coffee to 3-5 eight-ounce cups per day. Limit caffeinated beverages that have added calories, such as soda and energy drinks. If you drink alcohol: Limit how much you have to: 0-1 drink a day if you are female. 0-2 drinks a day if you are female. Know how much alcohol is in your drink. In the [place] one drink is one 12 oz bottle of beer (355 mL), one 5 oz glass of wine (  148 mL), or one 1 oz glass of hard liquor (44 mL). Seasoning and other foods Try not to add too much salt to your food. Try using herbs and spices instead of salt. Try not to add sugar to food. This information is based on [place] nutrition guidelines. To learn more, visit [employer].be. Exact amounts may vary. You may need different amounts. This information is not intended to replace advice given to you by your health care provider. Make sure you discuss any questions you have with your health care provider. Document Revised: 05/21/2022 Document Reviewed: 05/21/2022 Elsevier Patient Education  2024 [employer].

## 2023-10-25 ENCOUNTER — Ambulatory Visit (INDEPENDENT_AMBULATORY_CARE_PROVIDER_SITE_OTHER): Payer: Medicare Other | Admitting: Nurse Practitioner

## 2023-10-25 ENCOUNTER — Encounter: Payer: Self-pay | Admitting: Nurse Practitioner

## 2023-10-25 VITALS — BP 110/69 | HR 71 | Temp 97.4°F | Ht 67.0 in | Wt 157.0 lb

## 2023-10-25 DIAGNOSIS — E7800 Pure hypercholesterolemia, unspecified: Secondary | ICD-10-CM | POA: Diagnosis not present

## 2023-10-25 DIAGNOSIS — G43119 Migraine with aura, intractable, without status migrainosus: Secondary | ICD-10-CM

## 2023-10-25 DIAGNOSIS — M85851 Other specified disorders of bone density and structure, right thigh: Secondary | ICD-10-CM

## 2023-10-25 DIAGNOSIS — E039 Hypothyroidism, unspecified: Secondary | ICD-10-CM | POA: Diagnosis not present

## 2023-10-25 DIAGNOSIS — Z23 Encounter for immunization: Secondary | ICD-10-CM

## 2023-10-25 DIAGNOSIS — Z6827 Body mass index (BMI) 27.0-27.9, adult: Secondary | ICD-10-CM

## 2023-10-25 NOTE — Assessment & Plan Note (Signed)
Chronic, last DEXA August 2023.  Continue Vitamin D, may need to utilize higher weekly doses in future. Consider bisphosphonate if osteoporosis presents -- if poorly tolerated consider injectable.  Dexa next around 04/27/24.

## 2023-10-25 NOTE — Assessment & Plan Note (Signed)
Ongoing, with ASCVD 4.9%.  Continue focus on diet regimen, discussed at length with her.  Poor tolerance to fish oil due to GERD.   Return in 6 months.  Check lipid panel today.

## 2023-10-25 NOTE — Progress Notes (Signed)
BP 110/69   Pulse 71   Temp (!) 97.4 F (36.3 C) (Oral)   Ht 5\' 7"  (1.702 m)   Wt 157 lb (71.2 kg)   SpO2 98%   BMI 24.59 kg/m    Subjective:    Patient ID: Cindy Jefferson, female    DOB: [birth year], 68 y.o.   MRN: [id number]  HPI: Cindy Jefferson is a 68 y.o. female  Chief Complaint  Patient presents with   Hypothyroidism   Osteopenia   Migraine   MIGRAINES Saw neurology last 06/26/23.  Has changed diet and gotten off sugar. Taking Nurtec as needed.  Migraines have been better. Duration: chronic Onset: gradual Severity: moderate Quality: dull, aching, and throbbing Frequency: intermittent Location: left side and posterior Headache duration: >24 hours Radiation: no Time of day headache occurs: varies Alleviating factors: Diet changes and Nurtec Aggravating factors: diet Headache status at time of visit:  very slight Treatments attempted: multiple  Aura: yes Nausea:  yes Vomiting: no Photophobia:  yes Phonophobia:  yes Effect on social functioning:  no Numbers of missed days of school/work each month: 0 Confusion:  no Gait disturbance/ataxia:  no Behavioral changes:  no Fevers:  no  The 10-year ASCVD risk score (Arnett DK, et al., 2019) is: 4.9%   Values used to calculate the score:     Age: 3 years     Sex: Female     Is Non-Hispanic African American: No     Diabetic: No     Tobacco smoker: No     Systolic Blood Pressure: 110 mmHg     Is BP treated: No     HDL Cholesterol: 76 mg/dL     Total Cholesterol: 222 mg/dL   HYPOTHYROIDISM Taking Levothyroxine 75 MCG daily.  Every winter her fingers split and bleed. Thyroid control status:stable Satisfied with current treatment? yes Medication side effects: no Medication compliance: good compliance Etiology of hypothyroidism: unknown Recent dose adjustment:no Fatigue: no Cold intolerance: yes Heat intolerance: no Weight gain: no Weight loss: no Constipation: no Diarrhea/loose stools:  no Palpitations: no Lower extremity edema: no Anxiety/depressed mood: no    OSTEOPENIA DEXA 04/27/22.  Has history of fusion, with last imaging in 2020.   Satisfied with current treatment?: yes Adequate calcium & vitamin D: yes Weight bearing exercises: no      10/25/2023    9:11 AM 04/24/2023    2:04 PM 08/01/2022    2:49 PM 07/20/2022    9:48 AM 07/03/2022    2:47 PM  Depression screen PHQ 2/9  Decreased Interest 0 0 0 0 0  Down, Depressed, Hopeless 0 0 0 0 0  PHQ - 2 Score 0 0 0 0 0  Altered sleeping 0 0 0 0 0  Tired, decreased energy 0 0 0 0 1  Change in appetite 0 0 0 0 0  Feeling bad or failure about yourself  0 0 0 0 0  Trouble concentrating 0 0 0 0 0  Moving slowly or fidgety/restless 0 0 0 0 0  Suicidal thoughts 0 0 0 0 0  PHQ-9 Score 0 0 0 0 1  Difficult doing work/chores Not difficult at all Not difficult at all Not difficult at all Not difficult at all Not difficult at all       10/25/2023    9:11 AM 04/24/2023    2:04 PM 08/01/2022    2:49 PM 07/20/2022    9:47 AM  GAD 7 : Generalized Anxiety Score  Nervous,  Anxious, on Edge 0 0 1 3  Control/stop worrying 0 0 0 1  Worry too much - different things 0 0 0 0  Trouble relaxing 0 0 0 0  Restless 0 0 1 1  Easily annoyed or irritable 0 0 1 0  Afraid - awful might happen 0 0 0 0  Total GAD 7 Score 0 0 3 5  Anxiety Difficulty Not difficult at all Not difficult at all Not difficult at all Not difficult at all   Relevant past medical, surgical, family and social history reviewed and updated as indicated. Interim medical history since our last visit reviewed. Allergies and medications reviewed and updated.  Review of Systems  Constitutional:  Negative for activity change, appetite change, diaphoresis, fatigue and fever.  Respiratory:  Negative for cough, chest tightness, shortness of breath and wheezing.   Cardiovascular:  Negative for chest pain, palpitations and leg swelling.  Gastrointestinal: Negative.    Endocrine: Positive for cold intolerance. Negative for heat intolerance.  Neurological: Negative.   Psychiatric/Behavioral: Negative.      Per HPI unless specifically indicated above     Objective:    BP 110/69   Pulse 71   Temp (!) 97.4 F (36.3 C) (Oral)   Ht 5\' 7"  (1.702 m)   Wt 157 lb (71.2 kg)   SpO2 98%   BMI 24.59 kg/m   Wt Readings from Last 3 Encounters:  10/25/23 157 lb (71.2 kg)  04/24/23 174 lb (78.9 kg)  08/01/22 174 lb 11.2 oz (79.2 kg)    Physical Exam Vitals and nursing note reviewed.  Constitutional:      General: She is awake. She is not in acute distress.    Appearance: She is well-developed and well-groomed. She is not ill-appearing or toxic-appearing.  HENT:     Head: Normocephalic.     Right Ear: Hearing and external ear normal.     Left Ear: Hearing and external ear normal.  Eyes:     General: Lids are normal.        Right eye: No discharge.        Left eye: No discharge.     Conjunctiva/sclera: Conjunctivae normal.     Pupils: Pupils are equal, round, and reactive to light.  Neck:     Thyroid: No thyromegaly.     Vascular: No carotid bruit.  Cardiovascular:     Rate and Rhythm: Normal rate and regular rhythm.     Heart sounds: Normal heart sounds. No murmur heard.    No gallop.  Pulmonary:     Effort: Pulmonary effort is normal. No accessory muscle usage or respiratory distress.     Breath sounds: Normal breath sounds.  Abdominal:     General: Bowel sounds are normal. There is no distension.     Palpations: Abdomen is soft.     Tenderness: There is no abdominal tenderness.  Musculoskeletal:     Cervical back: Normal range of motion and neck supple.     Right lower leg: No edema.     Left lower leg: No edema.  Lymphadenopathy:     Cervical: No cervical adenopathy.  Skin:    General: Skin is warm and dry.  Neurological:     Mental Status: She is alert and oriented to person, place, and time.     Deep Tendon Reflexes: Reflexes are  normal and symmetric.     Reflex Scores:      Brachioradialis reflexes are 2+ on the right side and  2+ on the left side.      Patellar reflexes are 2+ on the right side and 2+ on the left side. Psychiatric:        Attention and Perception: Attention normal.        Mood and Affect: Mood normal.        Speech: Speech normal.        Behavior: Behavior normal. Behavior is cooperative.        Thought Content: Thought content normal.     Results for orders placed or performed in visit on 04/24/23  T4, free   Collection Time: 04/24/23  4:42 PM  Result Value Ref Range   Free T4 1.46 0.82 - 1.77 ng/dL  CBC with Differential/Platelet   Collection Time: 04/24/23  4:42 PM  Result Value Ref Range   WBC 5.2 3.4 - 10.8 x10E3/uL   RBC 4.60 3.77 - 5.28 x10E6/uL   Hemoglobin 13.1 11.1 - 15.9 g/dL   Hematocrit 16.1 09.6 - 46.6 %   MCV 87 79 - 97 fL   MCH 28.5 26.6 - 33.0 pg   MCHC 32.8 31.5 - 35.7 g/dL   RDW 04.5 40.9 - 81.1 %   Platelets 257 150 - 450 x10E3/uL   Neutrophils 49 Not Estab. %   Lymphs 40 Not Estab. %   Monocytes 7 Not Estab. %   Eos 2 Not Estab. %   Basos 2 Not Estab. %   Neutrophils Absolute 2.6 1.4 - 7.0 x10E3/uL   Lymphocytes Absolute 2.1 0.7 - 3.1 x10E3/uL   Monocytes Absolute 0.4 0.1 - 0.9 x10E3/uL   EOS (ABSOLUTE) 0.1 0.0 - 0.4 x10E3/uL   Basophils Absolute 0.1 0.0 - 0.2 x10E3/uL   Immature Granulocytes 0 Not Estab. %   Immature Grans (Abs) 0.0 0.0 - 0.1 x10E3/uL  Comprehensive metabolic panel   Collection Time: 04/24/23  4:42 PM  Result Value Ref Range   Glucose 86 70 - 99 mg/dL   BUN 16 8 - 27 mg/dL   Creatinine, Ser 9.14 0.57 - 1.00 mg/dL   eGFR 67 >78 GN/FAO/1.30   BUN/Creatinine Ratio 17 12 - 28   Sodium 139 134 - 144 mmol/L   Potassium 4.1 3.5 - 5.2 mmol/L   Chloride 99 96 - 106 mmol/L   CO2 25 20 - 29 mmol/L   Calcium 9.6 8.7 - 10.3 mg/dL   Total Protein 6.8 6.0 - 8.5 g/dL   Albumin 4.3 3.9 - 4.9 g/dL   Globulin, Total 2.5 1.5 - 4.5 g/dL   Bilirubin  Total 0.3 0.0 - 1.2 mg/dL   Alkaline Phosphatase 120 44 - 121 IU/L   AST 31 0 - 40 IU/L   ALT 23 0 - 32 IU/L  Lipid Panel w/o Chol/HDL Ratio   Collection Time: 04/24/23  4:42 PM  Result Value Ref Range   Cholesterol, Total 222 (H) 100 - 199 mg/dL   Triglycerides 865 (H) 0 - 149 mg/dL   HDL 76 >78 mg/dL   VLDL Cholesterol Cal 27 5 - 40 mg/dL   LDL Chol Calc (NIH) 469 (H) 0 - 99 mg/dL  TSH   Collection Time: 04/24/23  4:42 PM  Result Value Ref Range   TSH 1.090 0.450 - 4.500 uIU/mL  VITAMIN D 25 Hydroxy (Vit-D Deficiency, Fractures)   Collection Time: 04/24/23  4:42 PM  Result Value Ref Range   Vit D, 25-Hydroxy 36.0 30.0 - 100.0 ng/mL  Magnesium   Collection Time: 04/24/23  4:42 PM  Result Value  Ref Range   Magnesium 2.0 1.6 - 2.3 mg/dL      Assessment & Plan:   Problem List Items Addressed This Visit       Cardiovascular and Mediastinum   Intractable migraine with aura without status migrainosus - Primary   Chronic, stable at this time with Nurtec only -- has not taken Ajovy in some time, but may restart if migraines worsen.  Continue current medication regimen as prescribed by neurology and collaboration.  She has history of pancreatitis with medications used for migraines, will need to be cautious with treatment.  Return in 6 months.         Endocrine   Acquired hypothyroidism   Chronic, stable with TSH within normal range last check.  Continue current regimen and adjust as needed.  TSH, Free T4 today on labs.   Return in 6 months.       Relevant Orders   T4, free   TSH     Musculoskeletal and Integument   Osteopenia   Chronic, last DEXA August 2023.  Continue Vitamin D, may need to utilize higher weekly doses in future. Consider bisphosphonate if osteoporosis presents -- if poorly tolerated consider injectable.  Dexa next around 04/27/24.        Other   Elevated LDL cholesterol level   Ongoing, with ASCVD 4.9%.  Continue focus on diet regimen, discussed at  length with her.  Poor tolerance to fish oil due to GERD.   Return in 6 months.  Check lipid panel today.      Relevant Orders   Lipid Panel w/o Chol/HDL Ratio     Follow up plan: Return in about 6 months (around 04/23/2024) for Annual Physical -- after 04/23/24.

## 2023-10-25 NOTE — Assessment & Plan Note (Signed)
Chronic, stable at this time with Nurtec only -- has not taken Ajovy in some time, but may restart if migraines worsen.  Continue current medication regimen as prescribed by neurology and collaboration.  She has history of pancreatitis with medications used for migraines, will need to be cautious with treatment.  Return in 6 months.

## 2023-10-25 NOTE — Assessment & Plan Note (Signed)
Chronic, stable with TSH within normal range last check.  Continue current regimen and adjust as needed.  TSH, Free T4 today on labs.   Return in 6 months.

## 2023-10-26 ENCOUNTER — Encounter: Payer: Self-pay | Admitting: Nurse Practitioner

## 2023-10-26 LAB — TSH: TSH: 0.884 u[IU]/mL (ref 0.450–4.500)

## 2023-10-26 LAB — LIPID PANEL W/O CHOL/HDL RATIO
Cholesterol, Total: 241 mg/dL — ABNORMAL HIGH (ref 100–199)
HDL: 76 mg/dL (ref >39)
LDL Chol Calc (NIH): 146 mg/dL — ABNORMAL HIGH (ref 0–99)
Triglycerides: 111 mg/dL (ref 0–149)
VLDL Cholesterol Cal: 19 mg/dL (ref 5–40)

## 2023-10-26 LAB — T4, FREE: Free T4: 1.61 ng/dL (ref 0.82–1.77)

## 2023-10-26 NOTE — Progress Notes (Signed)
 Contacted via MyChart   Good evening Cindy Jefferson, your labs have returned.  Thyroid levels are stable.  Lipid panel shows ongoing elevations, however no medication is needed.  Continue your current diet focus.  Any questions? Keep being amazing!!  Thank you for allowing me to participate in your care.  I appreciate you. Kindest regards, Gustava Berland

## 2023-10-30 ENCOUNTER — Other Ambulatory Visit: Payer: Self-pay | Admitting: Nurse Practitioner

## 2023-10-30 DIAGNOSIS — Z1231 Encounter for screening mammogram for malignant neoplasm of breast: Secondary | ICD-10-CM

## 2023-11-12 ENCOUNTER — Ambulatory Visit
Admission: RE | Admit: 2023-11-12 | Discharge: 2023-11-12 | Disposition: A | Discharge status: Home / Self Care | Payer: BLUE CROSS/BLUE SHIELD | Source: Ambulatory Visit | Attending: Nurse Practitioner | Admitting: Nurse Practitioner

## 2023-11-12 DIAGNOSIS — Z1231 Encounter for screening mammogram for malignant neoplasm of breast: Secondary | ICD-10-CM | POA: Insufficient documentation

## 2023-11-15 ENCOUNTER — Encounter: Payer: Self-pay | Admitting: Nurse Practitioner

## 2023-11-15 NOTE — Progress Notes (Signed)
 Contacted via MyChart   Normal mammogram, may repeat in one year:)

## 2024-01-20 ENCOUNTER — Other Ambulatory Visit: Payer: Self-pay | Admitting: Nurse Practitioner

## 2024-01-22 NOTE — Telephone Encounter (Signed)
 Requested medication (s) are due for refill today:   Provider to review  Requested medication (s) are on the active medication list:   No  Future visit scheduled:   Yes 8/28   LOV 10/25/2023   Last ordered: Not on list but there is a note that she is to use this after intercourse as needed..   No protocol assigned to this.   Requested Prescriptions  Pending Prescriptions Disp Refills   nitrofurantoin , macrocrystal-monohydrate, (MACROBID ) 100 MG capsule [Pharmacy Med Name: NITROFURANTOIN  MONO-MCR 100 MG] 60 capsule 6    Sig: TAKE 1 CAPULE BY MOUTH AFTER INTERCOURSE AS NEEDED FOR UTI PREVENTION     Off-Protocol Failed - 01/22/2024  1:45 PM      Failed - Medication not assigned to a protocol, review manually.      Passed - Valid encounter within last 12 months    Recent Outpatient Visits           2 months ago Intractable migraine with aura without status migrainosus   [facility] [place], Lavelle Posey, NP       Future Appointments             In 8 months Harris Liming, MD [facility]

## 2024-02-25 ENCOUNTER — Other Ambulatory Visit: Payer: Self-pay

## 2024-02-25 DIAGNOSIS — H8113 Benign paroxysmal vertigo, bilateral: Secondary | ICD-10-CM

## 2024-02-25 DIAGNOSIS — G43119 Migraine with aura, intractable, without status migrainosus: Secondary | ICD-10-CM

## 2024-02-27 ENCOUNTER — Ambulatory Visit
Admission: RE | Admit: 2024-02-27 | Discharge: 2024-02-27 | Disposition: A | Discharge status: Home / Self Care | Source: Ambulatory Visit

## 2024-02-27 DIAGNOSIS — G43119 Migraine with aura, intractable, without status migrainosus: Secondary | ICD-10-CM | POA: Insufficient documentation

## 2024-02-27 DIAGNOSIS — H8113 Benign paroxysmal vertigo, bilateral: Secondary | ICD-10-CM | POA: Insufficient documentation

## 2024-04-26 DIAGNOSIS — E538 Deficiency of other specified B group vitamins: Secondary | ICD-10-CM | POA: Insufficient documentation

## 2024-04-26 NOTE — Patient Instructions (Signed)
 Be Involved in Caring For Your Health:  Taking Medications When medications are taken as directed, they can greatly improve your health. But if they are not taken as prescribed, they may not work. In some cases, not taking them correctly can be harmful. To help ensure your treatment remains effective and safe, understand your medications and how to take them. Bring your medications to each visit for review by your provider.  Your lab results, notes, and after visit summary will be available on My Chart. We strongly encourage you to use this feature. If lab results are abnormal the clinic will contact you with the appropriate steps. If the clinic does not contact you assume the results are satisfactory. You can always view your results on My Chart. If you have questions regarding your health or results, please contact the clinic during office hours. You can also ask questions on My Chart.  We at [facility] are grateful that you chose us  to provide your care. We strive to provide evidence-based and compassionate care and are always looking for feedback. If you get a survey from the clinic please complete this so we can hear your opinions.  Healthy Eating, Adult Healthy eating may help you get and keep a healthy body weight, reduce the risk of chronic disease, and live a long and productive life. It is important to follow a healthy eating pattern. Your nutritional and calorie needs should be met mainly by different nutrient-rich foods. What are tips for following this plan? Reading food labels Read labels and choose the following: Reduced or low sodium products. Juices with 100% fruit juice. Foods with low saturated fats (<3 g per serving) and high polyunsaturated and monounsaturated fats. Foods with whole grains, such as whole wheat, cracked wheat, brown rice, and wild rice. Whole grains that are fortified with folic acid. This is recommended for females who are pregnant or who want to  become pregnant. Read labels and do not eat or drink the following: Foods or drinks with added sugars. These include foods that contain brown sugar, corn sweetener, corn syrup, dextrose , fructose, glucose, high-fructose corn syrup, honey, invert sugar, lactose, malt syrup, maltose, molasses, raw sugar, sucrose, trehalose, or turbinado sugar. Limit your intake of added sugars to less than 10% of your total daily calories. Do not eat more than the following amounts of added sugar per day: 6 teaspoons (25 g) for females. 9 teaspoons (38 g) for males. Foods that contain processed or refined starches and grains. Refined grain products, such as white flour, degermed cornmeal, white bread, and white rice. Shopping Choose nutrient-rich snacks, such as vegetables, whole fruits, and nuts. Avoid high-calorie and high-sugar snacks, such as potato chips, fruit snacks, and candy. Use oil-based dressings and spreads on foods instead of solid fats such as butter, margarine, sour cream, or cream cheese. Limit pre-made sauces, mixes, and instant products such as flavored rice, instant noodles, and ready-made pasta. Try more plant-protein sources, such as tofu, tempeh, black beans, edamame, lentils, nuts, and seeds. Explore eating plans such as the Mediterranean diet or vegetarian diet. Try heart-healthy dips made with beans and healthy fats like hummus and guacamole. Vegetables go great with these. Cooking Use oil to saut or stir-fry foods instead of solid fats such as butter, margarine, or lard. Try baking, boiling, grilling, or broiling instead of frying. Remove the fatty part of meats before cooking. Steam vegetables in water  or broth. Meal planning  At meals, imagine dividing your plate into fourths: One-half of  your plate is fruits and vegetables. One-fourth of your plate is whole grains. One-fourth of your plate is protein, especially lean meats, poultry, eggs, tofu, beans, or nuts. Include low-fat  dairy as part of your daily diet. Lifestyle Choose healthy options in all settings, including home, work, school, restaurants, or stores. Prepare your food safely: Wash your hands after handling raw meats. Where you prepare food, keep surfaces clean by regularly washing with hot, soapy water . Keep raw meats separate from ready-to-eat foods, such as fruits and vegetables. Cook seafood, meat, poultry, and eggs to the recommended temperature. Get a food thermometer. Store foods at safe temperatures. In general: Keep cold foods at 84F (4.4C) or below. Keep hot foods at 184F (60C) or above. Keep your freezer at [facility] (-17.8C) or below. Foods are not safe to eat if they have been between the temperatures of 40-184F (4.4-60C) for more than 2 hours. What foods should I eat? Fruits Aim to eat 1-2 cups of fresh, canned (in natural juice), or frozen fruits each day. One cup of fruit equals 1 small apple, 1 large banana, 8 large strawberries, 1 cup (237 g) canned fruit,  cup (82 g) dried fruit, or 1 cup (240 mL) 100% juice. Vegetables Aim to eat 2-4 cups of fresh and frozen vegetables each day, including different varieties and colors. One cup of vegetables equals 1 cup (91 g) broccoli or cauliflower florets, 2 medium carrots, 2 cups (150 g) raw, leafy greens, 1 large tomato, 1 large bell pepper, 1 large sweet potato, or 1 medium white potato. Grains Aim to eat 5-10 ounce-equivalents of whole grains each day. Examples of 1 ounce-equivalent of grains include 1 slice of bread, 1 cup (40 g) ready-to-eat cereal, 3 cups (24 g) popcorn, or  cup (93 g) cooked rice. Meats and other proteins Try to eat 5-7 ounce-equivalents of protein each day. Examples of 1 ounce-equivalent of protein include 1 egg,  oz nuts (12 almonds, 24 pistachios, or 7 walnut halves), 1/4 cup (90 g) cooked beans, 6 tablespoons (90 g) hummus or 1 tablespoon (16 g) peanut butter. A cut of meat or fish that is the size of a deck of  cards is about 3-4 ounce-equivalents (85 g). Of the protein you eat each week, try to have at least 8 sounce (227 g) of seafood. This is about 2 servings per week. This includes salmon, trout, herring, sardines, and anchovies. Dairy Aim to eat 3 cup-equivalents of fat-free or low-fat dairy each day. Examples of 1 cup-equivalent of dairy include 1 cup (240 mL) milk, 8 ounces (250 g) yogurt, 1 ounces (44 g) natural cheese, or 1 cup (240 mL) fortified soy milk. Fats and oils Aim for about 5 teaspoons (21 g) of fats and oils per day. Choose monounsaturated fats, such as canola and olive oils, mayonnaise made with olive oil or avocado oil, avocados, peanut butter, and most nuts, or polyunsaturated fats, such as sunflower, corn, and soybean oils, walnuts, pine nuts, sesame seeds, sunflower seeds, and flaxseed. Beverages Aim for 6 eight-ounce glasses of water  per day. Limit coffee to 3-5 eight-ounce cups per day. Limit caffeinated beverages that have added calories, such as soda and energy drinks. If you drink alcohol: Limit how much you have to: 0-1 drink a day if you are female. 0-2 drinks a day if you are female. Know how much alcohol is in your drink. In the [place] one drink is one 12 oz bottle of beer (355 mL), one 5 oz glass of wine (  148 mL), or one 1 oz glass of hard liquor (44 mL). Seasoning and other foods Try not to add too much salt to your food. Try using herbs and spices instead of salt. Try not to add sugar to food. This information is based on [place] nutrition guidelines. To learn more, visit [employer]. Exact amounts may vary. You may need different amounts. This information is not intended to replace advice given to you by your health care provider. Make sure you discuss any questions you have with your health care provider. Document Revised: 05/21/2022 Document Reviewed: 05/21/2022 Elsevier Patient Education  2024 [employer].

## 2024-04-30 ENCOUNTER — Ambulatory Visit: Payer: BLUE CROSS/BLUE SHIELD | Admitting: Nurse Practitioner

## 2024-04-30 ENCOUNTER — Encounter: Payer: Self-pay | Admitting: Nurse Practitioner

## 2024-04-30 VITALS — BP 116/76 | HR 57 | Temp 98.5°F | Ht 66.1 in | Wt 152.4 lb

## 2024-04-30 DIAGNOSIS — G43119 Migraine with aura, intractable, without status migrainosus: Secondary | ICD-10-CM

## 2024-04-30 DIAGNOSIS — E039 Hypothyroidism, unspecified: Secondary | ICD-10-CM | POA: Diagnosis not present

## 2024-04-30 DIAGNOSIS — Z0000 Encounter for general adult medical examination without abnormal findings: Secondary | ICD-10-CM

## 2024-04-30 DIAGNOSIS — E7800 Pure hypercholesterolemia, unspecified: Secondary | ICD-10-CM | POA: Diagnosis not present

## 2024-04-30 DIAGNOSIS — K219 Gastro-esophageal reflux disease without esophagitis: Secondary | ICD-10-CM

## 2024-04-30 DIAGNOSIS — M85851 Other specified disorders of bone density and structure, right thigh: Secondary | ICD-10-CM | POA: Diagnosis not present

## 2024-04-30 DIAGNOSIS — E538 Deficiency of other specified B group vitamins: Secondary | ICD-10-CM | POA: Diagnosis not present

## 2024-04-30 MED ORDER — FLUTICASONE PROPIONATE 50 MCG/ACT NA SUSP: 2.0000 | Freq: Every day | NASAL | 6 refills | Status: AC

## 2024-04-30 MED ORDER — CYANOCOBALAMIN 1000 MCG/ML IJ SOLN
1000.0000 ug | INTRAMUSCULAR | Status: AC
  Administered 2024-04-30 – 2024-09-15 (×4): 1000 ug via INTRAMUSCULAR

## 2024-04-30 MED ORDER — LEVOTHYROXINE SODIUM 75 MCG PO TABS: 75.0000 ug | ORAL_TABLET | Freq: Every day | ORAL | 4 refills | Status: AC

## 2024-04-30 NOTE — Assessment & Plan Note (Signed)
 Ongoing, with ASCVD 6.3%.  Continue focus on diet regimen, discussed at length with her.  Poor tolerance to fish oil due to GERD.   Return in 6 months.  Check lipid panel today.

## 2024-04-30 NOTE — Assessment & Plan Note (Signed)
Chronic, stable with TSH within normal range last check.  Continue current regimen and adjust as needed.  TSH, Free T4 today on labs.   Return in 6 months.

## 2024-04-30 NOTE — Assessment & Plan Note (Signed)
 Noted on neurology labs <300.  Does not tolerate oral B12.  Discussed injections monthly, she would like to try this to help improve levels.  Her husband reports she has some brain fog at times.

## 2024-04-30 NOTE — Assessment & Plan Note (Signed)
 Chronic, stable at this time with Nurtec only.  Took Ajovy in past.  Continue current medication regimen as prescribed by neurology and collaboration.  She has history of pancreatitis with medications used for migraines, will need to be cautious with treatment.

## 2024-04-30 NOTE — Assessment & Plan Note (Signed)
 Chronic, last DEXA August 2023.  Continue Vitamin D , may need to utilize higher weekly doses in future. Consider bisphosphonate if osteoporosis presents -- if poorly tolerated consider injectable.  DEXA due, ordered today and she is aware to schedule.  Discussed benefit of adding on low weights a few times a week for muscle and bone health.

## 2024-04-30 NOTE — Assessment & Plan Note (Addendum)
 Chronic, ongoing.  Continue current medication regimen and adjust dose as needed. Uses only as needed.

## 2024-04-30 NOTE — Addendum Note (Signed)
 Addended by: Shamina Etheridge T on: 04/30/2024 08:36 AM   Modules accepted: Orders

## 2024-04-30 NOTE — Progress Notes (Signed)
 BP 116/76   Pulse (!) 57   Temp 98.5 F (36.9 C) (Oral)   Ht 5' 6.1 (1.679 m)   Wt 152 lb 6.4 oz (69.1 kg)   SpO2 98%   BMI 24.52 kg/m    Subjective:    Patient ID: Cindy [facility] Home, female    DOB: April 11, 1956, 68 y.o.   MRN: [id number]  HPI: Cindy Jefferson is a 68 y.o. female presenting on 04/30/2024 for Annual physical exam. Current medical complaints include:none  She currently lives with: husband Menopausal Symptoms: no  Follows with neurology for migraines, last visit was 02/24/24. Taking Nurtec, prescribed at that visit. B12 was low on labs with them, can't tolerate oral B12, irritates her belly.  HYPERLIPIDEMIA Hyperlipidemia status: good compliance Satisfied with current treatment?  yes Side effects:  no Medication compliance: good compliance Supplements: taking plant sterols Aspirin:  no The 10-year ASCVD risk score (Arnett DK, et al., 2019) is: 6.3%   Values used to calculate the score:     Age: 57 years     Clincally relevant sex: Female     Is Non-Hispanic African American: No     Diabetic: No     Tobacco smoker: No     Systolic Blood Pressure: 116 mmHg     Is BP treated: No     HDL Cholesterol: 76 mg/dL     Total Cholesterol: 241 mg/dL Chest pain:  no Coronary artery disease:  no Family history CAD:  yes Family history early CAD:  no   HYPOTHYROIDISM Taking Levothyroxine  75 MCG daily. Thyroid  control status:stable Satisfied with current treatment? yes Medication side effects: no Medication compliance: good compliance Etiology of hypothyroidism: unknown Recent dose adjustment:no Fatigue: no Cold intolerance: yes Heat intolerance: no Weight gain: no Weight loss: no Constipation: no Diarrhea/loose stools: no Palpitations: no Lower extremity edema: no Anxiety/depressed mood: no   OSTEOPENIA Last DEXA 04/27/22. Has history of fusion neck. Satisfied with current treatment?: yes Adequate calcium & vitamin D : yes Weight bearing exercises: no    GERD Takes Pepcid  as needed, this fluctuates on how often is taken.   GERD control status: stable  Satisfied with current treatment? yes Heartburn frequency: minimal Medication side effects: no  Medication compliance: stable Previous GERD medications: Antacid use frequency:  none Dysphagia: no Odynophagia:  no Hematemesis: no Blood in stool: no EGD: yes  Depression Screen done today and results listed below:     04/30/2024    8:09 AM 10/25/2023    9:11 AM 04/24/2023    2:04 PM 08/01/2022    2:49 PM 07/20/2022    9:48 AM  Depression screen PHQ 2/9  Decreased Interest 0 0 0 0 0  Down, Depressed, Hopeless 0 0 0 0 0  PHQ - 2 Score 0 0 0 0 0  Altered sleeping 0 0 0 0 0  Tired, decreased energy 0 0 0 0 0  Change in appetite 0 0 0 0 0  Feeling bad or failure about yourself  0 0 0 0 0  Trouble concentrating 0 0 0 0 0  Moving slowly or fidgety/restless 0 0 0 0 0  Suicidal thoughts 0 0 0 0 0  PHQ-9 Score 0 0 0 0 0  Difficult doing work/chores Not difficult at all Not difficult at all Not difficult at all Not difficult at all Not difficult at all      04/30/2024    8:09 AM 10/25/2023    9:11 AM 04/24/2023    2:04  PM 08/01/2022    2:49 PM  GAD 7 : Generalized Anxiety Score  Nervous, Anxious, on Edge 0 0 0 1  Control/stop worrying 0 0 0 0  Worry too much - different things 1 0 0 0  Trouble relaxing 0 0 0 0  Restless 0 0 0 1  Easily annoyed or irritable 1 0 0 1  Afraid - awful might happen 0 0 0 0  Total GAD 7 Score 2 0 0 3  Anxiety Difficulty Not difficult at all Not difficult at all Not difficult at all Not difficult at all      07/20/2022    9:47 AM 08/01/2022    2:48 PM 04/24/2023    2:04 PM 10/25/2023    9:03 AM 04/30/2024    8:08 AM  Fall Risk  Falls in the past year? 0 0 0 0 0  Was there an injury with Fall? 0 0 0 0 0  Fall Risk Category Calculator 0 0 0 0 0  Fall Risk Category (Retired) Low  Low      Patient at Risk for Falls Due to No Fall Risks No Fall Risks  No Fall Risks No Fall Risks No Fall Risks  Fall risk Follow up Falls evaluation completed  Falls evaluation completed  Falls evaluation completed Falls evaluation completed Falls evaluation completed     Data saved with a previous flowsheet row definition    Past Medical History:  Past Medical History:  Diagnosis Date   Actinic keratosis    Allergy 10 yrs ago   feel they have gotten worse   Arthritis a couple of years ago   several joints but rt knee worse I think   GERD (gastroesophageal reflux disease)    Migraine    Migraines    Pancreatitis    Thyroid  disease     Surgical History:  Past Surgical History:  Procedure Laterality Date   APPENDECTOMY  1993   when I had my hysterectomy   CHOLECYSTECTOMY     FOOT SURGERY     neck fusion     SPINE SURGERY  Dec 2002   neck fusion C5-6 and C6-7   TOTAL ABDOMINAL HYSTERECTOMY     total, does not get pap smears    TUBAL LIGATION  around 1984    Medications:  Current Outpatient Medications on File Prior to Visit  Medication Sig   Bacillus Coagulans-Inulin (ALIGN PREBIOTIC-PROBIOTIC PO) Take 1 tablet by mouth daily at 2 PM.   Calcium-Cholecalciferol 200-250 MG-UNIT TABS Take by mouth.   Cholecalciferol (VITAMIN D3) 2000 units capsule Take by mouth.   diclofenac  sodium (VOLTAREN ) 1 % GEL Apply 2 g topically as needed (four times a day as needed for neck pain).   famotidine  (PEPCID ) 20 MG tablet Take 1 tablet (20 mg total) by mouth daily.   nitrofurantoin , macrocrystal-monohydrate, (MACROBID ) 100 MG capsule TAKE 1 CAPULE BY MOUTH AFTER INTERCOURSE AS NEEDED FOR UTI PREVENTION   Rimegepant Sulfate (NURTEC) 75 MG TBDP Take 75 mg by mouth every other day.   No current facility-administered medications on file prior to visit.    Allergies:  Allergies  Allergen Reactions   Valproic Acid Other (See Comments)    Pancreatitis    Erythromycin Nausea Only   Tape Other (See Comments)    Skin Irritation      Social History:   Social History   Socioeconomic History   Marital status: Married    Spouse name: Not on file   Number  of children: 2   Years of education: Not on file   Highest education level: Associate degree: academic program  Occupational History   Occupation: retired  Tobacco Use   Smoking status: Never   Smokeless tobacco: Never  Vaping Use   Vaping status: Never Used  Substance and Sexual Activity   Alcohol use: Not Currently   Drug use: Never   Sexual activity: Yes  Other Topics Concern   Not on file  Social History Narrative   Not on file   Social Drivers of Health   Financial Resource Strain: Low Risk  (04/23/2023)   Overall Financial Resource Strain (CARDIA)    Difficulty of Paying Living Expenses: Not hard at all  Food Insecurity: No Food Insecurity (04/23/2023)   Hunger Vital Sign    Worried About Running Out of Food in the Last Year: Never true    Ran Out of Food in the Last Year: Never true  Transportation Needs: No Transportation Needs (04/23/2023)   PRAPARE - Administrator, Civil Service (Medical): No    Lack of Transportation (Non-Medical): No  Physical Activity: Insufficiently Active (04/23/2023)   Exercise Vital Sign    Days of Exercise per Week: 3 days    Minutes of Exercise per Session: 30 min  Stress: No Stress Concern Present (04/23/2023)   [employer] of Occupational Health - Occupational Stress Questionnaire    Feeling of Stress : Not at all  Social Connections: Moderately Integrated (04/24/2023)   Social Connection and Isolation Panel    Frequency of Communication with Friends and Family: More than three times a week    Frequency of Social Gatherings with Friends and Family: More than three times a week    Attends Religious Services: More than 4 times per year    Active Member of [employer] or Organizations: No    Attends Banker Meetings: Never    Marital Status: Married  Catering manager Violence: Not At Risk (04/24/2023)    Humiliation, Afraid, Rape, and Kick questionnaire    Fear of Current or Ex-Partner: No    Emotionally Abused: No    Physically Abused: No    Sexually Abused: No   Social History   Tobacco Use  Smoking Status Never  Smokeless Tobacco Never   Social History   Substance and Sexual Activity  Alcohol Use Not Currently    Family History:  Family History  Problem Relation Age of Onset   Hyperlipidemia Mother    Heart disease Mother    Lung cancer Mother    Arthritis Mother    Cancer Mother    Varicose Veins Mother    Leukemia Father    Early death Father    Arthritis Sister    Thyroid  disease Sister    Varicose Veins Sister    Lupus Daughter    Arthritis Maternal Grandmother    Varicose Veins Maternal Grandmother    Colon cancer Maternal Grandfather    Arthritis Sister    Varicose Veins Sister    Arthritis Maternal Aunt    Kidney cancer Neg Hx    Breast cancer Neg Hx     Past medical history, surgical history, medications, allergies, family history and social history reviewed with patient today and changes made to appropriate areas of the chart.   ROS All other ROS negative except what is listed above and in the HPI.      Objective:    BP 116/76   Pulse (!) 57  Temp 98.5 F (36.9 C) (Oral)   Ht 5' 6.1 (1.679 m)   Wt 152 lb 6.4 oz (69.1 kg)   SpO2 98%   BMI 24.52 kg/m   Wt Readings from Last 3 Encounters:  04/30/24 152 lb 6.4 oz (69.1 kg)  10/25/23 157 lb (71.2 kg)  04/24/23 174 lb (78.9 kg)    Physical Exam Vitals and nursing note reviewed. Exam conducted with a chaperone present.  Constitutional:      General: She is awake. She is not in acute distress.    Appearance: She is well-developed and well-groomed. She is not ill-appearing or toxic-appearing.  HENT:     Head: Normocephalic and atraumatic.     Right Ear: Hearing, tympanic membrane, ear canal and external ear normal. No drainage.     Left Ear: Hearing, tympanic membrane, ear canal and  external ear normal. No drainage.     Nose: Nose normal.     Right Sinus: No maxillary sinus tenderness or frontal sinus tenderness.     Left Sinus: No maxillary sinus tenderness or frontal sinus tenderness.     Mouth/Throat:     Mouth: Mucous membranes are moist.     Pharynx: Oropharynx is clear. Uvula midline. No pharyngeal swelling, oropharyngeal exudate or posterior oropharyngeal erythema.  Eyes:     General: Lids are normal.        Right eye: No discharge.        Left eye: No discharge.     Extraocular Movements: Extraocular movements intact.     Conjunctiva/sclera: Conjunctivae normal.     Pupils: Pupils are equal, round, and reactive to light.     Visual Fields: Right eye visual fields normal and left eye visual fields normal.  Neck:     Thyroid : No thyromegaly.     Vascular: No carotid bruit.     Trachea: Trachea normal.  Cardiovascular:     Rate and Rhythm: Normal rate and regular rhythm.     Heart sounds: Normal heart sounds. No murmur heard.    No gallop.  Pulmonary:     Effort: Pulmonary effort is normal. No accessory muscle usage or respiratory distress.     Breath sounds: Normal breath sounds.  Chest:  Breasts:    Right: Normal.     Left: Normal.  Abdominal:     General: Bowel sounds are normal.     Palpations: Abdomen is soft. There is no hepatomegaly or splenomegaly.     Tenderness: There is no abdominal tenderness.  Musculoskeletal:        General: Normal range of motion.     Cervical back: Normal range of motion and neck supple.     Right lower leg: No edema.     Left lower leg: No edema.  Lymphadenopathy:     Head:     Right side of head: No submental, submandibular, tonsillar, preauricular or posterior auricular adenopathy.     Left side of head: No submental, submandibular, tonsillar, preauricular or posterior auricular adenopathy.     Cervical: No cervical adenopathy.     Upper Body:     Right upper body: No supraclavicular, axillary or pectoral  adenopathy.     Left upper body: No supraclavicular, axillary or pectoral adenopathy.  Skin:    General: Skin is warm and dry.     Capillary Refill: Capillary refill takes less than 2 seconds.     Findings: No rash.  Neurological:     Mental Status: She is alert and  oriented to person, place, and time.     Gait: Gait is intact.     Deep Tendon Reflexes: Reflexes are normal and symmetric.     Reflex Scores:      Brachioradialis reflexes are 2+ on the right side and 2+ on the left side.      Patellar reflexes are 2+ on the right side and 2+ on the left side. Psychiatric:        Attention and Perception: Attention normal.        Mood and Affect: Mood normal.        Speech: Speech normal.        Behavior: Behavior normal. Behavior is cooperative.        Thought Content: Thought content normal.        Judgment: Judgment normal.       04/24/2023    7:32 PM  6CIT Screen  What Year? 0 points  What month? 0 points  What time? 0 points  Count back from 20 0 points  Months in reverse 0 points  Repeat phrase 0 points  Total Score 0 points   Results for orders placed or performed in visit on 10/25/23  T4, free   Collection Time: 10/25/23  9:39 AM  Result Value Ref Range   Free T4 1.61 0.82 - 1.77 ng/dL  TSH   Collection Time: 10/25/23  9:39 AM  Result Value Ref Range   TSH 0.884 0.450 - 4.500 uIU/mL  Lipid Panel w/o Chol/HDL Ratio   Collection Time: 10/25/23  9:39 AM  Result Value Ref Range   Cholesterol, Total 241 (H) 100 - 199 mg/dL   Triglycerides 888 0 - 149 mg/dL   HDL 76 >60 mg/dL   VLDL Cholesterol Cal 19 5 - 40 mg/dL   LDL Chol Calc (NIH) 853 (H) 0 - 99 mg/dL      Assessment & Plan:   Problem List Items Addressed This Visit       Cardiovascular and Mediastinum   Intractable migraine with aura without status migrainosus - Primary   Chronic, stable at this time with Nurtec only.  Took Ajovy in past.  Continue current medication regimen as prescribed by neurology  and collaboration.  She has history of pancreatitis with medications used for migraines, will need to be cautious with treatment.        Relevant Medications   Rimegepant Sulfate (NURTEC) 75 MG TBDP   fluticasone  (FLONASE ) 50 MCG/ACT nasal spray   Other Relevant Orders   CBC with Differential/Platelet     Digestive   GERD without esophagitis   Chronic, ongoing.  Continue current medication regimen and adjust dose as needed. Uses only as needed.        Endocrine   Acquired hypothyroidism   Chronic, stable with TSH within normal range last check.  Continue current regimen and adjust as needed.  TSH, Free T4 today on labs.   Return in 6 months.       Relevant Medications   levothyroxine  (SYNTHROID ) 75 MCG tablet   Other Relevant Orders   T4, free   TSH     Musculoskeletal and Integument   Osteopenia   Chronic, last DEXA August 2023.  Continue Vitamin D , may need to utilize higher weekly doses in future. Consider bisphosphonate if osteoporosis presents -- if poorly tolerated consider injectable.  DEXA due, ordered today and she is aware to schedule.  Discussed benefit of adding on low weights a few times a week for muscle  and bone health.      Relevant Orders   VITAMIN D  25 Hydroxy (Vit-D Deficiency, Fractures)   DG Bone Density     Other   Elevated LDL cholesterol level   Ongoing, with ASCVD 6.3%.  Continue focus on diet regimen, discussed at length with her.  Poor tolerance to fish oil due to GERD.   Return in 6 months.  Check lipid panel today.      Relevant Orders   Comprehensive metabolic panel with GFR   Lipid Panel w/o Chol/HDL Ratio   B12 deficiency   Noted on neurology labs <300.  Does not tolerate oral B12.  Discussed injections monthly, she would like to try this to help improve levels.  Her husband reports she has some brain fog at times.      Other Visit Diagnoses       Encounter for annual physical exam       Annual physical today with labs and health  maintenance reviewed, discussed with patient.        Follow up plan: Return in about 6 months (around 10/31/2024) for THYROID , HLD, OSTEOPENIA + needs Medicare Wellness with nurse please + needs monthly B12 visits.   LABORATORY TESTING:  - Pap smear: not applicable  IMMUNIZATIONS:   - Tdap: Tetanus vaccination status reviewed: last tetanus booster within 10 years. - Influenza: Up to date - Pneumovax: Not applicable - Prevnar: Up to date - COVID: Up to date - HPV: Not applicable - Shingrix vaccine: Up to date  SCREENING: -Mammogram: Up to date had last in March 2025 - Colonoscopy: Up to date  - Bone Density: Up to date  -Hearing Test: Not applicable  -Spirometry: Not applicable   PATIENT COUNSELING:   Advised to take 1 mg of folate supplement per day if capable of pregnancy.   Sexuality: Discussed sexually transmitted diseases, partner selection, use of condoms, avoidance of unintended pregnancy  and contraceptive alternatives.   Advised to avoid cigarette smoking.  I discussed with the patient that most people either abstain from alcohol or drink within safe limits (<=14/week and <=4 drinks/occasion for males, <=7/weeks and <= 3 drinks/occasion for females) and that the risk for alcohol disorders and other health effects rises proportionally with the number of drinks per week and how often a drinker exceeds daily limits.  Discussed cessation/primary prevention of drug use and availability of treatment for abuse.   Diet: Encouraged to adjust caloric intake to maintain  or achieve ideal body weight, to reduce intake of dietary saturated fat and total fat, to limit sodium intake by avoiding high sodium foods and not adding table salt, and to maintain adequate dietary potassium and calcium preferably from fresh fruits, vegetables, and low-fat dairy products.    stressed the importance of regular exercise  Injury prevention: Discussed safety belts, safety helmets, smoke detector,  smoking near bedding or upholstery.   Dental health: Discussed importance of regular tooth brushing, flossing, and dental visits.    NEXT PREVENTATIVE PHYSICAL DUE IN 1 YEAR. Return in about 6 months (around 10/31/2024) for THYROID , HLD, OSTEOPENIA + needs Medicare Wellness with nurse please + needs monthly B12 visits.

## 2024-05-01 ENCOUNTER — Ambulatory Visit: Payer: Self-pay | Admitting: Nurse Practitioner

## 2024-05-01 ENCOUNTER — Ambulatory Visit (INDEPENDENT_AMBULATORY_CARE_PROVIDER_SITE_OTHER): Admitting: Emergency Medicine

## 2024-05-01 VITALS — Ht 66.0 in | Wt 152.0 lb

## 2024-05-01 DIAGNOSIS — Z0000 Encounter for general adult medical examination without abnormal findings: Secondary | ICD-10-CM | POA: Diagnosis not present

## 2024-05-01 LAB — VITAMIN D 25 HYDROXY (VIT D DEFICIENCY, FRACTURES): Vit D, 25-Hydroxy: 47.6 ng/mL (ref 30.0–100.0)

## 2024-05-01 LAB — CBC WITH DIFFERENTIAL/PLATELET
Basophils Absolute: 0.1 x10E3/uL (ref 0.0–0.2)
Basos: 1 %
EOS (ABSOLUTE): 0 x10E3/uL (ref 0.0–0.4)
Eos: 1 %
Hematocrit: 44.1 % (ref 34.0–46.6)
Hemoglobin: 14 g/dL (ref 11.1–15.9)
Immature Grans (Abs): 0 x10E3/uL (ref 0.0–0.1)
Immature Granulocytes: 0 %
Lymphocytes Absolute: 1.6 x10E3/uL (ref 0.7–3.1)
Lymphs: 38 %
MCH: 29.6 pg (ref 26.6–33.0)
MCHC: 31.7 g/dL (ref 31.5–35.7)
MCV: 93 fL (ref 79–97)
Monocytes Absolute: 0.3 x10E3/uL (ref 0.1–0.9)
Monocytes: 8 %
Neutrophils Absolute: 2.2 x10E3/uL (ref 1.4–7.0)
Neutrophils: 52 %
Platelets: 242 x10E3/uL (ref 150–450)
RBC: 4.73 x10E6/uL (ref 3.77–5.28)
RDW: 12 % (ref 11.7–15.4)
WBC: 4.2 x10E3/uL (ref 3.4–10.8)

## 2024-05-01 LAB — COMPREHENSIVE METABOLIC PANEL WITH GFR
ALT: 17 IU/L (ref 0–32)
AST: 24 IU/L (ref 0–40)
Albumin: 4.5 g/dL (ref 3.9–4.9)
Alkaline Phosphatase: 95 IU/L (ref 44–121)
BUN/Creatinine Ratio: 17 (ref 12–28)
BUN: 15 mg/dL (ref 8–27)
Bilirubin Total: 0.6 mg/dL (ref 0.0–1.2)
CO2: 24 mmol/L (ref 20–29)
Calcium: 9.2 mg/dL (ref 8.7–10.3)
Chloride: 100 mmol/L (ref 96–106)
Creatinine, Ser: 0.86 mg/dL (ref 0.57–1.00)
Globulin, Total: 2.1 g/dL (ref 1.5–4.5)
Glucose: 82 mg/dL (ref 70–99)
Potassium: 4.4 mmol/L (ref 3.5–5.2)
Sodium: 138 mmol/L (ref 134–144)
Total Protein: 6.6 g/dL (ref 6.0–8.5)
eGFR: 74 mL/min/1.73 (ref >59)

## 2024-05-01 LAB — LIPID PANEL W/O CHOL/HDL RATIO
Cholesterol, Total: 249 mg/dL — ABNORMAL HIGH (ref 100–199)
HDL: 86 mg/dL (ref >39)
LDL Chol Calc (NIH): 151 mg/dL — ABNORMAL HIGH (ref 0–99)
Triglycerides: 75 mg/dL (ref 0–149)
VLDL Cholesterol Cal: 12 mg/dL (ref 5–40)

## 2024-05-01 LAB — T4, FREE: Free T4: 1.65 ng/dL (ref 0.82–1.77)

## 2024-05-01 LAB — TSH: TSH: 0.965 u[IU]/mL (ref 0.450–4.500)

## 2024-05-01 NOTE — Progress Notes (Signed)
 Subjective:   Cindy Jefferson is a 68 y.o. who presents for a Medicare Wellness preventive visit.  As a reminder, Annual Wellness Visits don't include a physical exam, and some assessments may be limited, especially if this visit is performed virtually. We may recommend an in-person follow-up visit with your provider if needed.  Visit Complete: Virtual I connected with  [facility] on 05/01/24 by a audio enabled telemedicine application and verified that I am speaking with the correct person using two identifiers.  Patient Location: Home  Provider Location: Home Office  I discussed the limitations of evaluation and management by telemedicine. The patient expressed understanding and agreed to proceed.  Vital Signs: Because this visit was a virtual/telehealth visit, some criteria may be missing or patient reported. Any vitals not documented were not able to be obtained and vitals that have been documented are patient reported.  VideoDeclined- This patient declined Librarian, academic. Therefore the visit was completed with audio only.  Persons Participating in Visit: Patient.  AWV Questionnaire: No: Patient Medicare AWV questionnaire was not completed prior to this visit.  Cardiac Risk Factors include: advanced age (>68men, >45 women)     Objective:    Today's Vitals   05/01/24 0835  Weight: 152 lb (68.9 kg)  Height: 5' 6 (1.676 m)   Body mass index is 24.53 kg/m.     05/01/2024    8:51 AM 10/06/2019   10:22 AM 02/24/2019    3:11 PM 01/01/2019    6:15 PM  Advanced Directives  Does Patient Have a Medical Advance Directive? No No No No  Would patient like information on creating a medical advance directive? Yes (MAU/Ambulatory/Procedural Areas - Information given) No - Patient declined No - Patient declined  No - Patient declined      Data saved with a previous flowsheet row definition    Current Medications (verified) Outpatient Encounter  Medications as of 05/01/2024  Medication Sig   Bacillus Coagulans-Inulin (ALIGN PREBIOTIC-PROBIOTIC PO) Take 1 tablet by mouth daily at 2 PM.   Calcium-Cholecalciferol 200-250 MG-UNIT TABS Take by mouth.   Cholecalciferol (VITAMIN D3) 2000 units capsule Take by mouth.   diclofenac  sodium (VOLTAREN ) 1 % GEL Apply 2 g topically as needed (four times a day as needed for neck pain).   famotidine  (PEPCID ) 20 MG tablet Take 1 tablet (20 mg total) by mouth daily. (Patient taking differently: Take 20 mg by mouth daily. PRN)   fluticasone  (FLONASE ) 50 MCG/ACT nasal spray Place 2 sprays into both nostrils daily. (Patient taking differently: Place 2 sprays into both nostrils daily. PRN)   levothyroxine  (SYNTHROID ) 75 MCG tablet Take 1 tablet (75 mcg total) by mouth daily.   nitrofurantoin , macrocrystal-monohydrate, (MACROBID ) 100 MG capsule TAKE 1 CAPULE BY MOUTH AFTER INTERCOURSE AS NEEDED FOR UTI PREVENTION   Rimegepant Sulfate (NURTEC) 75 MG TBDP Take 75 mg by mouth every other day. (Patient taking differently: Take 75 mg by mouth every other day. PRN)   Facility-Administered Encounter Medications as of 05/01/2024  Medication   cyanocobalamin  (VITAMIN B12) injection 1,000 mcg    Allergies (verified) Valproic acid, Erythromycin, and Tape   History: Past Medical History:  Diagnosis Date   Actinic keratosis    Allergy 10 yrs ago   feel they have gotten worse   Arthritis a couple of years ago   several joints but rt knee worse I think   GERD (gastroesophageal reflux disease)    Migraine    Migraines  Pancreatitis    Thyroid  disease    Past Surgical History:  Procedure Laterality Date   APPENDECTOMY  1993   when I had my hysterectomy   CHOLECYSTECTOMY     FOOT SURGERY     neck fusion     SPINE SURGERY  Dec 2002   neck fusion C5-6 and C6-7   TOTAL ABDOMINAL HYSTERECTOMY     total, does not get pap smears    TUBAL LIGATION  around 1984   Family History  Problem Relation Age of Onset    Hyperlipidemia Mother    Heart disease Mother    Lung cancer Mother    Arthritis Mother    Cancer Mother    Varicose Veins Mother    Leukemia Father    Early death Father    Arthritis Sister    Thyroid  disease Sister    Varicose Veins Sister    Lupus Daughter    Arthritis Maternal Grandmother    Varicose Veins Maternal Grandmother    Colon cancer Maternal Grandfather    Arthritis Sister    Varicose Veins Sister    Arthritis Maternal Aunt    Kidney cancer Neg Hx    Breast cancer Neg Hx    Social History   Socioeconomic History   Marital status: Married    Spouse name: Luis   Number of children: 2   Years of education: Not on file   Highest education level: Associate degree: academic program  Occupational History   Occupation: retired  Tobacco Use   Smoking status: Former    Current packs/day: 0.00    Average packs/day: 0.2 packs/day for 6.0 years (1.2 ttl pk-yrs)    Types: Cigarettes    Start date: 84    Quit date: 1982    Years since quitting: 43.6   Smokeless tobacco: Never   Tobacco comments:    05/01/24 social smoker from ages [age]/pbt  Vaping Use   Vaping status: Never Used  Substance and Sexual Activity   Alcohol use: Not Currently   Drug use: Never   Sexual activity: Yes  Other Topics Concern   Not on file  Social History Narrative   Not on file   Social Drivers of Health   Financial Resource Strain: Low Risk  (05/01/2024)   Overall Financial Resource Strain (CARDIA)    Difficulty of Paying Living Expenses: Not hard at all  Food Insecurity: No Food Insecurity (05/01/2024)   Hunger Vital Sign    Worried About Running Out of Food in the Last Year: Never true    Ran Out of Food in the Last Year: Never true  Transportation Needs: No Transportation Needs (05/01/2024)   PRAPARE - Administrator, Civil Service (Medical): No    Lack of Transportation (Non-Medical): No  Physical Activity: Sufficiently Active (05/01/2024)   Exercise Vital  Sign    Days of Exercise per Week: 5 days    Minutes of Exercise per Session: 50 min  Stress: No Stress Concern Present (05/01/2024)   [employer] of Occupational Health - Occupational Stress Questionnaire    Feeling of Stress: Only a little  Social Connections: Moderately Isolated (05/01/2024)   Social Connection and Isolation Panel    Frequency of Communication with Friends and Family: More than three times a week    Frequency of Social Gatherings with Friends and Family: More than three times a week    Attends Religious Services: Never    Database administrator or Organizations: No  Attends Banker Meetings: Never    Marital Status: Married    Tobacco Counseling Counseling given: Not Answered Tobacco comments: 05/01/24 social smoker from ages [age]/pbt    Clinical Intake:  Pre-visit preparation completed: Yes  Pain : No/denies pain     BMI - recorded: 24.53 Nutritional Status: BMI of 19-24  Normal Nutritional Risks: None Diabetes: No  No results found for: HGBA1C   How often do you need to have someone help you when you read instructions, pamphlets, or other written materials from your doctor or pharmacy?: 1 - Never  Interpreter Needed?: No  Information entered by :: Vina Ned, CMA   Activities of Daily Living     05/01/2024    8:38 AM  In your present state of health, do you have any difficulty performing the following activities:  Hearing? 0  Vision? 0  Difficulty concentrating or making decisions? 0  Walking or climbing stairs? 0  Dressing or bathing? 0  Doing errands, shopping? 0  Preparing Food and eating ? N  Using the Toilet? N  In the past six months, have you accidently leaked urine? Y  Comment not often, wears panty liner prn  Do you have problems with loss of bowel control? N  Managing your Medications? N  Managing your Finances? N  Housekeeping or managing your Housekeeping? N    Patient Care Team: Valerio Melanie DASEN, NP as PCP - General (Nurse Practitioner) Mevelyn JONETTA Bathe, OD (Optometry) Caffaro, Kaitlin T, PA-C (Neurology) Maree Jannett POUR, MD as Consulting Physician (Neurology) Claudene Lehmann, MD as Referring Physician (Dermatology) Ashley Soulier, DPM as Referring Physician (Podiatry)  I have updated your Care Teams any recent Medical Services you may have received from other providers in the past year.     Assessment:   This is a routine wellness examination for Jerline.  Hearing/Vision screen Hearing Screening - Comments:: Denies hearing loss   Vision Screening - Comments:: Gets routine eye exams, Dr Bathe Mevelyn, Arlyss Hensley   Goals Addressed             This Visit's Progress    Patient Stated       Work on decreasing LDL       Depression Screen     05/01/2024    8:46 AM 04/30/2024    8:09 AM 10/25/2023    9:11 AM 04/24/2023    2:04 PM 08/01/2022    2:49 PM 07/20/2022    9:48 AM 07/03/2022    2:47 PM  PHQ 2/9 Scores  PHQ - 2 Score 0 0 0 0 0 0 0  PHQ- 9 Score 0 0 0 0 0 0 1    Fall Risk     05/01/2024    8:53 AM 04/30/2024    8:08 AM 10/25/2023    9:03 AM 04/24/2023    2:04 PM 08/01/2022    2:48 PM  Fall Risk   Falls in the past year? 0 0 0 0 0  Number falls in past yr: 0 0 0 0 0  Injury with Fall? 0 0 0 0 0  Risk for fall due to : No Fall Risks No Fall Risks No Fall Risks No Fall Risks No Fall Risks  Follow up Falls evaluation completed Falls evaluation completed Falls evaluation completed Falls evaluation completed Falls evaluation completed      Data saved with a previous flowsheet row definition    MEDICARE RISK AT HOME:  Medicare Risk at Home Any stairs in  or around the home?: Yes If so, are there any without handrails?: No Home free of loose throw rugs in walkways, pet beds, electrical cords, etc?: Yes Adequate lighting in your home to reduce risk of falls?: Yes Life alert?: No Use of a cane, walker or w/c?: No Grab bars in the bathroom?: No Shower  chair or bench in shower?: No Elevated toilet seat or a handicapped toilet?: Yes  TIMED UP AND GO:  Was the test performed?  No  Cognitive Function: 6CIT completed        05/01/2024    8:55 AM 04/24/2023    7:32 PM  6CIT Screen  What Year? 0 points 0 points  What month? 0 points 0 points  What time? 0 points 0 points  Count back from 20 0 points 0 points  Months in reverse 0 points 0 points  Repeat phrase 0 points 0 points  Total Score 0 points 0 points    Immunizations Immunization History  Administered Date(s) Administered   Fluad Quad(high Dose 65+) 09/11/2021   Hepatitis A 03/17/2012, 11/15/2012, 05/18/2013   Hepatitis B 07/19/2011, 11/15/2012, 05/18/2013   Influenza Inj Mdck Quad Pf 06/24/2018   Influenza,inj,Quad PF,6+ Mos 05/16/2019   MMR 03/17/2012   PFIZER(Purple Top)SARS-COV-2 Vaccination 01/19/2020, 02/16/2020   PNEUMOCOCCAL CONJUGATE-20 04/24/2023   Pneumococcal Conjugate-13 03/26/2022   Tdap 09/17/2010, 01/01/2019   Typhoid Inactivated 03/17/2012   Yellow Fever 03/17/2012   Zoster Recombinant(Shingrix) 12/16/2016, 12/28/2016, 05/18/2017    Screening Tests Health Maintenance  Topic Date Due   DEXA SCAN  04/27/2024   INFLUENZA VACCINE  04/03/2024   COVID-19 Vaccine (3 - Pfizer risk series) 05/12/2024 (Originally 03/15/2020)   MAMMOGRAM  11/11/2024   Medicare Annual Wellness (AWV)  05/01/2025   Colonoscopy  12/26/2026   DTaP/Tdap/Td (3 - Td or Tdap) 12/31/2028   Pneumococcal Vaccine: 50+ Years  Completed   Hepatitis C Screening  Completed   Zoster Vaccines- Shingrix  Completed   HPV VACCINES  Aged Out   Meningococcal B Vaccine  Aged Out   Hepatitis B Vaccines 19-59 Average Risk  Discontinued    Health Maintenance  Health Maintenance Due  Topic Date Due   DEXA SCAN  04/27/2024   INFLUENZA VACCINE  04/03/2024   Health Maintenance Items Addressed: See Nurse Notes at the end of this note  Additional Screening:  Vision Screening: Recommended  annual ophthalmology exams for early detection of glaucoma and other disorders of the eye. Would you like a referral to an eye doctor? No    Dental Screening: Recommended annual dental exams for proper oral hygiene  Community Resource Referral / Chronic Care Management: CRR required this visit?  No   CCM required this visit?  No   Plan:    I have personally reviewed and noted the following in the patient's chart:   Medical and social history Use of alcohol, tobacco or illicit drugs  Current medications and supplements including opioid prescriptions. Patient is not currently taking opioid prescriptions. Functional ability and status Nutritional status Physical activity Advanced directives List of other physicians Hospitalizations, surgeries, and ER visits in previous 12 months Vitals Screenings to include cognitive, depression, and falls Referrals and appointments  In addition, I have reviewed and discussed with patient certain preventive protocols, quality metrics, and best practice recommendations. A written personalized care plan for preventive services as well as general preventive health recommendations were provided to patient.   Vina Ned, CMA   05/01/2024   After Visit Summary: (MyChart) Due  to this being a telephonic visit, the after visit summary with patients personalized plan was offered to patient via MyChart   Notes:  Will get flu vaccine in the fall Gave ph# to call and schedule DEXA scan (ordered 04/30/24)

## 2024-05-01 NOTE — Patient Instructions (Signed)
 Ms. Cindy Jefferson , Thank you for taking time out of your busy schedule to complete your Annual Wellness Visit with me. I enjoyed our conversation and look forward to speaking with you again next year. I, as well as your care team,  appreciate your ongoing commitment to your health goals. Please review the following plan we discussed and let me know if I can assist you in the future. Your Game plan/ To Do List    Referrals: None   Follow up Visits: We will see or speak with you next year for your Next Medicare AWV with our clinical staff Have you seen your provider in the last 6 months (3 months if uncontrolled diabetes)? Yes  Clinician Recommendations: Call [facility] [phone]) to schedule your bone density test at your convenience.  Aim for 30 minutes of exercise or brisk walking, 6-8 glasses of water, and 5 servings of fruits and vegetables each day.       This is a list of the screenings recommended for you:  Health Maintenance  Topic Date Due   DEXA scan (bone density measurement)  04/27/2024   Flu Shot  04/03/2024   COVID-19 Vaccine (3 - Pfizer risk series) 05/12/2024*   Mammogram  11/11/2024   Medicare Annual Wellness Visit  05/01/2025   Colon Cancer Screening  12/26/2026   DTaP/Tdap/Td vaccine (3 - Td or Tdap) 12/31/2028   Pneumococcal Vaccine for age over 34  Completed   Hepatitis C Screening  Completed   Zoster (Shingles) Vaccine  Completed   HPV Vaccine  Aged Out   Meningitis B Vaccine  Aged Out   Hepatitis B Vaccine  Discontinued  *Topic was postponed. The date shown is not the original due date.    Advanced directives: (ACP Link)Information on Advanced Care Planning can be found at [place]  Secretary of [facility] Advance Health Care Directives Advance Health Care Directives. [email or web address] You may also get the forms at your doctor's office. Advance Care Planning is important because it:  [x]  Makes sure you receive the medical care that is consistent with your values,  goals, and preferences  [x]  It provides guidance to your family and loved ones and reduces their decisional burden about whether or not they are making the right decisions based on your wishes.  Follow the link provided in your after visit summary or read over the paperwork we have mailed to you to help you started getting your Advance Directives in place. If you need assistance in completing these, please reach out to us  so that we can help you!  See attachments for Preventive Care and Fall Prevention Tips.   Fall Prevention in the Home, Adult Falls can cause injuries and affect people of all ages. There are many simple things that you can do to make your home safe and to help prevent falls. If you need it, ask for help making these changes. What actions can I take to prevent falls? General information Use good lighting in all rooms. Make sure to: Replace any light bulbs that burn out. Turn on lights if it is dark and use night-lights. Keep items that you use often in easy-to-reach places. Lower the shelves around your home if needed. Move furniture so that there are clear paths around it. Do not keep throw rugs or other things on the floor that can make you trip. If any of your floors are uneven, fix them. Add color or contrast paint or tape to clearly mark and help you see: Grab bars  or handrails. First and last steps of staircases. Where the edge of each step is. If you use a ladder or stepladder: Make sure that it is fully opened. Do not climb a closed ladder. Make sure the sides of the ladder are locked in place. Have someone hold the ladder while you use it. Know where your pets are as you move through your home. What can I do in the bathroom?     Keep the floor dry. Clean up any water that is on the floor right away. Remove soap buildup in the bathtub or shower. Buildup makes bathtubs and showers slippery. Use non-skid mats or decals on the floor of the bathtub or  shower. Attach bath mats securely with double-sided, non-slip rug tape. If you need to sit down while you are in the shower, use a non-slip stool. Install grab bars by the toilet and in the bathtub and shower. Do not use towel bars as grab bars. What can I do in the bedroom? Make sure that you have a light by your bed that is easy to reach. Do not use any sheets or blankets on your bed that hang to the floor. Have a firm bench or chair with side arms that you can use for support when you get dressed. What can I do in the kitchen? Clean up any spills right away. If you need to reach something above you, use a sturdy step stool that has a grab bar. Keep electrical cables out of the way. Do not use floor polish or wax that makes floors slippery. What can I do with my stairs? Do not leave anything on the stairs. Make sure that you have a light switch at the top and the bottom of the stairs. Have them installed if you do not have them. Make sure that there are handrails on both sides of the stairs. Fix handrails that are broken or loose. Make sure that handrails are as long as the staircases. Install non-slip stair treads on all stairs in your home if they do not have carpet. Avoid having throw rugs at the top or bottom of stairs, or secure the rugs with carpet tape to prevent them from moving. Choose a carpet design that does not hide the edge of steps on the stairs. Make sure that carpet is firmly attached to the stairs. Fix any carpet that is loose or worn. What can I do on the outside of my home? Use bright outdoor lighting. Repair the edges of walkways and driveways and fix any cracks. Clear paths of anything that can make you trip, such as tools or rocks. Add color or contrast paint or tape to clearly mark and help you see high doorway thresholds. Trim any bushes or trees on the main path into your home. Check that handrails are securely fastened and in good repair. Both sides of all steps  should have handrails. Install guardrails along the edges of any raised decks or porches. Have leaves, snow, and ice cleared regularly. Use sand, salt, or ice melt on walkways during winter months if you live where there is ice and snow. In the garage, clean up any spills right away, including grease or oil spills. What other actions can I take? Review your medicines with your health care provider. Some medicines can make you confused or feel dizzy. This can increase your chance of falling. Wear closed-toe shoes that fit well and support your feet. Wear shoes that have rubber soles and low heels. Use  a cane, walker, scooter, or crutches that help you move around if needed. Talk with your provider about other ways that you can decrease your risk of falls. This may include seeing a physical therapist to learn to do exercises to improve movement and strength. Where to find more information Centers for Disease Control and Prevention, STEADI: TonerPromos.no [employer] on Aging: BaseRingTones.pl National Institute on Aging: BaseRingTones.pl Contact a health care provider if: You are afraid of falling at home. You feel weak, drowsy, or dizzy at home. You fall at home. Get help right away if you: Lose consciousness or have trouble moving after a fall. Have a fall that causes a head injury. These symptoms may be an emergency. Get help right away. Call 911. Do not wait to see if the symptoms will go away. Do not drive yourself to the hospital. This information is not intended to replace advice given to you by your health care provider. Make sure you discuss any questions you have with your health care provider. Document Revised: 04/23/2022 Document Reviewed: 04/23/2022 Elsevier Patient Education  2024 [employer].

## 2024-05-01 NOTE — Progress Notes (Signed)
 Contacted via MyChart  Good afternoon Cindy Jefferson, your labs have returned and everything looks great with exception of lipid panel. I recommend we recheck this next visit as you are moving more into the category of where we would recommend medication.  If levels place your risk score above 7% next visit then I will recommend starting a statin. Current risk score is below.  Any questions? The 10-year ASCVD risk score (Arnett DK, et al., 2019) is: 8.9%   Values used to calculate the score:     Age: 68 years     Clincally relevant sex: Female     Is Non-Hispanic African American: No     Diabetic: No     Tobacco smoker: No     Systolic Blood Pressure: 142 mmHg     Is BP treated: No     HDL Cholesterol: 86 mg/dL     Total Cholesterol: 249 mg/dL Keep being stellar!!  Thank you for allowing me to participate in your care.  I appreciate you. Kindest regards, Tzvi Economou

## 2024-05-18 ENCOUNTER — Encounter: Payer: Self-pay | Admitting: Family Medicine

## 2024-05-18 ENCOUNTER — Ambulatory Visit (INDEPENDENT_AMBULATORY_CARE_PROVIDER_SITE_OTHER): Admitting: Family Medicine

## 2024-05-18 ENCOUNTER — Ambulatory Visit: Payer: Self-pay

## 2024-05-18 ENCOUNTER — Ambulatory Visit
Admission: RE | Admit: 2024-05-18 | Discharge: 2024-05-18 | Disposition: A | Discharge status: Home / Self Care | Source: Ambulatory Visit | Attending: Family Medicine | Admitting: Family Medicine

## 2024-05-18 VITALS — BP 108/70 | HR 66 | Ht 67.0 in | Wt 158.0 lb

## 2024-05-18 DIAGNOSIS — M542 Cervicalgia: Secondary | ICD-10-CM | POA: Insufficient documentation

## 2024-05-18 DIAGNOSIS — Z23 Encounter for immunization: Secondary | ICD-10-CM

## 2024-05-18 DIAGNOSIS — G8929 Other chronic pain: Secondary | ICD-10-CM | POA: Diagnosis not present

## 2024-05-18 MED ORDER — KETOROLAC TROMETHAMINE 60 MG/2ML IM SOLN: 60.0000 mg | Freq: Once | INTRAMUSCULAR | Status: AC

## 2024-05-18 MED ORDER — BACLOFEN 10 MG PO TABS: 5.0000 mg | ORAL_TABLET | Freq: Every evening | ORAL | 0 refills | Status: DC | PRN

## 2024-05-18 NOTE — Progress Notes (Unsigned)
 BP 108/70 (BP Location: Left Arm, Patient Position: Sitting, Cuff Size: Large)   Pulse 66   Ht 5' 7 (1.702 m)   Wt 158 lb (71.7 kg)   SpO2 99%   BMI 24.75 kg/m    Subjective:    Patient ID: Cindy [facility], female    DOB: March 02, 1956, 68 y.o.   MRN: [id number]  HPI: Cindy Jefferson is a 68 y.o. female  Chief Complaint  Patient presents with   Neck Pain    Pain located in the posterior spine and radiates down to the right scapula, right arm and right hand   NECK PAIN  Location: R neck into her shoulder blade into her R arm Duration:6 months, worse in the past few days Severity: 6-10/10 Quality: tight, aching Frequency: constant Radiation: R arm Aggravating factors: nothing Alleviating factors: nothing Weakness:  yes Paresthesias / decreased sensation:  no  Fevers:  no Status: worse Treatments attempted: ibuprofen, ice, heat, deep blue, stretching  Relief with NSAIDs?:  no   Relevant past medical, surgical, family and social history reviewed and updated as indicated. Interim medical history since our last visit reviewed. Allergies and medications reviewed and updated.  Review of Systems  Constitutional: Negative.   Respiratory: Negative.    Cardiovascular: Negative.   Musculoskeletal:  Positive for myalgias, neck pain and neck stiffness. Negative for arthralgias, back pain, gait problem and joint swelling.  Skin: Negative.   Neurological:  Positive for weakness. Negative for dizziness, tremors, seizures, syncope, facial asymmetry, speech difficulty, light-headedness, numbness and headaches.  Psychiatric/Behavioral: Negative.      Per HPI unless specifically indicated above     Objective:    BP 108/70 (BP Location: Left Arm, Patient Position: Sitting, Cuff Size: Large)   Pulse 66   Ht 5' 7 (1.702 m)   Wt 158 lb (71.7 kg)   SpO2 99%   BMI 24.75 kg/m   Wt Readings from Last 3 Encounters:  05/18/24 158 lb (71.7 kg)  05/01/24 152 lb (68.9 kg)  04/30/24  152 lb 6.4 oz (69.1 kg)    Physical Exam Vitals and nursing note reviewed.  Constitutional:      General: She is not in acute distress.    Appearance: Normal appearance. She is normal weight. She is not ill-appearing, toxic-appearing or diaphoretic.  HENT:     Head: Normocephalic and atraumatic.     Right Ear: External ear normal.     Left Ear: External ear normal.     Nose: Nose normal.     Mouth/Throat:     Mouth: Mucous membranes are moist.     Pharynx: Oropharynx is clear.  Eyes:     General: No scleral icterus.       Right eye: No discharge.        Left eye: No discharge.     Extraocular Movements: Extraocular movements intact.     Conjunctiva/sclera: Conjunctivae normal.     Pupils: Pupils are equal, round, and reactive to light.  Cardiovascular:     Rate and Rhythm: Normal rate and regular rhythm.     Pulses: Normal pulses.     Heart sounds: Normal heart sounds. No murmur heard.    No friction rub. No gallop.  Pulmonary:     Effort: Pulmonary effort is normal. No respiratory distress.     Breath sounds: Normal breath sounds. No stridor. No wheezing, rhonchi or rales.  Chest:     Chest wall: No tenderness.  Musculoskeletal:  General: Tenderness present. No swelling, deformity or signs of injury. Normal range of motion.     Cervical back: Normal range of motion and neck supple.     Right lower leg: No edema.     Left lower leg: No edema.  Skin:    General: Skin is warm and dry.     Capillary Refill: Capillary refill takes less than 2 seconds.     Coloration: Skin is not jaundiced or pale.     Findings: No bruising, erythema, lesion or rash.  Neurological:     General: No focal deficit present.     Mental Status: She is alert and oriented to person, place, and time. Mental status is at baseline.  Psychiatric:        Mood and Affect: Mood normal.        Behavior: Behavior normal.        Thought Content: Thought content normal.        Judgment: Judgment  normal.     Results for orders placed or performed in visit on 04/30/24  T4, free   Collection Time: 04/30/24  8:38 AM  Result Value Ref Range   Free T4 1.65 0.82 - 1.77 ng/dL  CBC with Differential/Platelet   Collection Time: 04/30/24  8:38 AM  Result Value Ref Range   WBC 4.2 3.4 - 10.8 x10E3/uL   RBC 4.73 3.77 - 5.28 x10E6/uL   Hemoglobin 14.0 11.1 - 15.9 g/dL   Hematocrit 55.8 65.9 - 46.6 %   MCV 93 79 - 97 fL   MCH 29.6 26.6 - 33.0 pg   MCHC 31.7 31.5 - 35.7 g/dL   RDW 87.9 88.2 - 84.5 %   Platelets 242 150 - 450 x10E3/uL   Neutrophils 52 Not Estab. %   Lymphs 38 Not Estab. %   Monocytes 8 Not Estab. %   Eos 1 Not Estab. %   Basos 1 Not Estab. %   Neutrophils Absolute 2.2 1.4 - 7.0 x10E3/uL   Lymphocytes Absolute 1.6 0.7 - 3.1 x10E3/uL   Monocytes Absolute 0.3 0.1 - 0.9 x10E3/uL   EOS (ABSOLUTE) 0.0 0.0 - 0.4 x10E3/uL   Basophils Absolute 0.1 0.0 - 0.2 x10E3/uL   Immature Granulocytes 0 Not Estab. %   Immature Grans (Abs) 0.0 0.0 - 0.1 x10E3/uL  Comprehensive metabolic panel with GFR   Collection Time: 04/30/24  8:38 AM  Result Value Ref Range   Glucose 82 70 - 99 mg/dL   BUN 15 8 - 27 mg/dL   Creatinine, Ser 9.13 0.57 - 1.00 mg/dL   eGFR 74 >40 fO/fpw/8.26   BUN/Creatinine Ratio 17 12 - 28   Sodium 138 134 - 144 mmol/L   Potassium 4.4 3.5 - 5.2 mmol/L   Chloride 100 96 - 106 mmol/L   CO2 24 20 - 29 mmol/L   Calcium 9.2 8.7 - 10.3 mg/dL   Total Protein 6.6 6.0 - 8.5 g/dL   Albumin 4.5 3.9 - 4.9 g/dL   Globulin, Total 2.1 1.5 - 4.5 g/dL   Bilirubin Total 0.6 0.0 - 1.2 mg/dL   Alkaline Phosphatase 95 44 - 121 IU/L   AST 24 0 - 40 IU/L   ALT 17 0 - 32 IU/L  Lipid Panel w/o Chol/HDL Ratio   Collection Time: 04/30/24  8:38 AM  Result Value Ref Range   Cholesterol, Total 249 (H) 100 - 199 mg/dL   Triglycerides 75 0 - 149 mg/dL   HDL 86 >60 mg/dL   VLDL  Cholesterol Cal 12 5 - 40 mg/dL   LDL Chol Calc (NIH) 848 (H) 0 - 99 mg/dL  TSH   Collection Time:  04/30/24  8:38 AM  Result Value Ref Range   TSH 0.965 0.450 - 4.500 uIU/mL  VITAMIN D  25 Hydroxy (Vit-D Deficiency, Fractures)   Collection Time: 04/30/24  8:38 AM  Result Value Ref Range   Vit D, 25-Hydroxy 47.6 30.0 - 100.0 ng/mL      Assessment & Plan:   Problem List Items Addressed This Visit       Other   Chronic neck pain - Primary   In exacerbation. Will give toradol  shot and start baclofen . Continue stretches. Will send her for x-ray. Call with any concerns or if not getting better.       Relevant Medications   baclofen  (LIORESAL ) 10 MG tablet   ketorolac  (TORADOL ) injection 60 mg (Start on 05/18/2024  2:45 PM)   Other Relevant Orders   DG Cervical Spine Complete   Other Visit Diagnoses       Needs flu shot       Flu shot given today.   Relevant Orders   Flu vaccine HIGH DOSE PF(Fluzone Trivalent)        Follow up plan: Return if symptoms worsen or fail to improve.

## 2024-05-18 NOTE — Telephone Encounter (Signed)
 FYI Only or Action Required?: Action required by provider: request for appointment.  Patient was last seen in primary care on 04/30/2024 by Cannady, Jolene T, NP.  Called Nurse Triage reporting Neck Pain.  Symptoms began several months ago.  Interventions attempted: Rest, hydration, or home remedies.  Symptoms are: unchanged.  Triage Disposition: See HCP Within 4 Hours (Or PCP Triage)  Patient/caregiver understands and will follow disposition?: YesCopied from CRM [id number]. Topic: Clinical - Red Word Triage >> May 18, 2024 11:00 AM Larissa RAMAN wrote: [facility] that prompted transfer to Nurse Triage: severe neck pain -worsening Reason for Disposition  [1] SEVERE neck pain (e.g., excruciating, unable to do any normal activities) AND [2] not improved after 2 hours of pain medicine  Answer Assessment - Initial Assessment Questions Chronic neck pain. Pt is taking ibuprofen while using heating pad/ice packs. Pt has been traveling and feels this has aggravated the symptoms. Pt has neuro consult November.       1. ONSET: When did the pain begin?      6 months on and off 2. LOCATION: Where does it hurt?      Right side 3. PATTERN Does the pain come and go, or has it been constant since it started?      Constant for last 2 days 4. SEVERITY: How bad is the pain?  (Scale 0-10; or none or slight stiffness, mild, moderate, severe)     8 5. RADIATION: Does the pain go anywhere else, shoot into your arms?     Down into shoulder blade 6. CORD SYMPTOMS: Any weakness or numbness of the arms or legs?     denies 7. CAUSE: What do you think is causing the neck pain?     Not sure 8. NECK OVERUSE: Any recent activities that involved turning or twisting the neck?     na 9. OTHER SYMPTOMS: Do you have any other symptoms? (e.g., headache, fever, chest pain, difficulty breathing, neck swelling)     Right arm-burning pain  Protocols used: Neck Pain or Stiffness-A-AH

## 2024-05-18 NOTE — Assessment & Plan Note (Signed)
 In exacerbation. Will give toradol  shot and start baclofen . Continue stretches. Will send her for x-ray. Call with any concerns or if not getting better.

## 2024-05-19 DIAGNOSIS — Z23 Encounter for immunization: Secondary | ICD-10-CM | POA: Diagnosis not present

## 2024-05-28 ENCOUNTER — Ambulatory Visit: Payer: Self-pay | Admitting: Family Medicine

## 2024-06-01 ENCOUNTER — Encounter: Payer: Self-pay | Admitting: Nurse Practitioner

## 2024-06-02 ENCOUNTER — Ambulatory Visit (INDEPENDENT_AMBULATORY_CARE_PROVIDER_SITE_OTHER)

## 2024-06-02 DIAGNOSIS — E538 Deficiency of other specified B group vitamins: Secondary | ICD-10-CM | POA: Diagnosis not present

## 2024-06-02 NOTE — Progress Notes (Signed)
 Patient is in office today for a nurse visit for B12 Injection. Patient Injection was given in the  Left deltoid. Patient tolerated injection well.

## 2024-06-06 NOTE — Patient Instructions (Incomplete)
Wegovy or Zepbound  Focus on DASH diet for high blood pressure or Mediterranean diet  Be Involved in Caring For Your Health:  Taking Medications When medications are taken as directed, they can greatly improve your health. But if they are not taken as prescribed, they may not work. In some cases, not taking them correctly can be harmful. To help ensure your treatment remains effective and safe, understand your medications and how to take them. Bring your medications to each visit for review by your provider.  Your lab results, notes, and after visit summary will be available on My Chart. We strongly encourage you to use this feature. If lab results are abnormal the clinic will contact you with the appropriate steps. If the clinic does not contact you assume the results are satisfactory. You can always view your results on My Chart. If you have questions regarding your health or results, please contact the clinic during office hours. You can also ask questions on My Chart.  We at [facility] are grateful that you chose us to provide your care. We strive to provide evidence-based and compassionate care and are always looking for feedback. If you get a survey from the clinic please complete this so we can hear your opinions.  Preventing High Cholesterol Cholesterol is a white, waxy substance similar to fat that the human body needs to help build cells. The liver makes all the cholesterol that a person's body needs. Having high cholesterol (hypercholesterolemia) increases your risk for heart disease and stroke. Extra or excess cholesterol comes from the food that you eat. High cholesterol can often be prevented with diet and lifestyle changes. If you already have high cholesterol, you can control it with diet, lifestyle changes, and medicines. How can high cholesterol affect me? If you have high cholesterol, fatty deposits (plaques) may build up on the walls of your blood vessels. The blood  vessels that carry blood away from your heart are called arteries. Plaques make the arteries narrower and stiffer. This in turn can: Restrict or block blood flow and cause blood clots to form. Increase your risk for heart attack and stroke. What can increase my risk for high cholesterol? This condition is more likely to develop in people who: Eat foods that are high in saturated fat or cholesterol. Saturated fat is mostly found in foods that come from animal sources. Are overweight. Are not getting enough exercise. Use products that contain nicotine or tobacco, such as cigarettes, e-cigarettes, and chewing tobacco. Have a family history of high cholesterol (familial hypercholesterolemia). What actions can I take to prevent this? Nutrition  Eat less saturated fat. Avoid trans fats (partially hydrogenated oils). These are often found in margarine and in some baked goods, fried foods, and snacks bought in packages. Avoid precooked or cured meat, such as bacon, sausages, or meat loaves. Avoid foods and drinks that have added sugars. Eat more fruits, vegetables, and whole grains. Choose healthy sources of protein, such as fish, poultry, lean cuts of red meat, beans, peas, lentils, and nuts. Choose healthy sources of fat, such as: Nuts. Vegetable oils, especially olive oil. Fish that have healthy fats, such as omega-3 fatty acids. These fish include mackerel or salmon. Lifestyle Lose weight if you are overweight. Maintaining a healthy body mass index (BMI) can help prevent or control high cholesterol. It can also lower your risk for diabetes and high blood pressure. Ask your health care provider to help you with a diet and exercise plan to lose   weight safely. Do not use any products that contain nicotine or tobacco. These products include cigarettes, chewing tobacco, and vaping devices, such as e-cigarettes. If you need help quitting, ask your health care provider. Alcohol use Do not drink  alcohol if: Your health care provider tells you not to drink. You are pregnant, may be pregnant, or are planning to become pregnant. If you drink alcohol: Limit how much you have to: 0-1 drink a day for women. 0-2 drinks a day for men. Know how much alcohol is in your drink. In the [place] one drink equals one 12 oz bottle of beer (355 mL), one 5 oz glass of wine (148 mL), or one 1 oz glass of hard liquor (44 mL). Activity  Get enough exercise. Do exercises as told by your health care provider. Each week, do at least 150 minutes of exercise that takes a medium level of effort (moderate-intensity exercise). This kind of exercise: Makes your heart beat faster while allowing you to still be able to talk. Can be done in short sessions several times a day or longer sessions a few times a week. For example, on 5 days each week, you could walk fast or ride your bike 3 times a day for 10 minutes each time. Medicines Your health care provider may recommend medicines to help lower cholesterol. This may be a medicine to lower the amount of cholesterol that your liver makes. You may need medicine if: Diet and lifestyle changes have not lowered your cholesterol enough. You have high cholesterol and other risk factors for heart disease or stroke. Take over-the-counter and prescription medicines only as told by your health care provider. General information Manage your risk factors for high cholesterol. Talk with your health care provider about all your risk factors and how to lower your risk. Manage other conditions that you have, such as diabetes or high blood pressure (hypertension). Have blood tests to check your cholesterol levels at regular points in time as told by your health care provider. Keep all follow-up visits. This is important. Where to find more information American Heart Association: www.heart.org National Heart, Lung, and Blood Institute: www.nhlbi.nih.gov Summary High cholesterol  increases your risk for heart disease and stroke. By keeping your cholesterol level low, you can reduce your risk for these conditions. High cholesterol can often be prevented with diet and lifestyle changes. Work with your health care provider to manage your risk factors, and have your blood tested regularly. This information is not intended to replace advice given to you by your health care provider. Make sure you discuss any questions you have with your health care provider. Document Revised: 03/23/2022 Document Reviewed: 10/24/2020 Elsevier Patient Education  2024 [employer]vier Inc.  

## 2024-06-08 ENCOUNTER — Ambulatory Visit: Admitting: Nurse Practitioner

## 2024-06-09 ENCOUNTER — Other Ambulatory Visit: Payer: Self-pay | Admitting: Family Medicine

## 2024-06-11 NOTE — Telephone Encounter (Signed)
 Requested medication (s) are due for refill today: routing for review  Requested medication (s) are on the active medication list: yes  Last refill:  05/18/24  Future visit scheduled: yes  Notes to clinic:  should patient continue to take, routing for review.     Requested Prescriptions  Pending Prescriptions Disp Refills   baclofen  (LIORESAL ) 10 MG tablet [Pharmacy Med Name: BACLOFEN  10 MG TABLET] 90 tablet 1    Sig: Take 0.5-1 tablets (5-10 mg total) by mouth at bedtime as needed.     Analgesics:  Muscle Relaxants - baclofen  Passed - 06/11/2024  9:11 AM      Passed - Cr in normal range and within 180 days    Creatinine, Ser  Date Value Ref Range Status  04/30/2024 0.86 0.57 - 1.00 mg/dL Final         Passed - eGFR is 30 or above and within 180 days    GFR calc Af Amer  Date Value Ref Range Status  02/29/2020 79 >59 mL/min/1.73 Final    Comment:    **[facility] currently reports eGFR in compliance with the current**   recommendations of the [employer]. [facility] will   update reporting as new guidelines are published from the NKF-ASN   Task force.    GFR calc non Af Amer  Date Value Ref Range Status  02/29/2020 68 >59 mL/min/1.73 Final   eGFR  Date Value Ref Range Status  04/30/2024 74 >59 mL/min/1.73 Final         Passed - Valid encounter within last 6 months    Recent Outpatient Visits           3 weeks ago Chronic neck pain   [facility] [place], Megan P, DO   1 month ago Intractable migraine with aura without status migrainosus   [facility] [place], [place] T, NP   7 months ago Intractable migraine with aura without status migrainosus   [facility] [place], Melanie DASEN, NP       Future Appointments             In 3 months Claudene Lehmann, MD [facility]

## 2024-06-17 MED ORDER — ROSUVASTATIN CALCIUM 10 MG PO TABS: 10.0000 mg | ORAL_TABLET | Freq: Every day | ORAL | 3 refills | Status: AC

## 2024-06-28 NOTE — Patient Instructions (Signed)
 Be Involved in Caring For Your Health:  Taking Medications When medications are taken as directed, they can greatly improve your health. But if they are not taken as prescribed, they may not work. In some cases, not taking them correctly can be harmful. To help ensure your treatment remains effective and safe, understand your medications and how to take them. Bring your medications to each visit for review by your provider.  Your lab results, notes, and after visit summary will be available on My Chart. We strongly encourage you to use this feature. If lab results are abnormal the clinic will contact you with the appropriate steps. If the clinic does not contact you assume the results are satisfactory. You can always view your results on My Chart. If you have questions regarding your health or results, please contact the clinic during office hours. You can also ask questions on My Chart.  We at [facility] are grateful that you chose [place] to provide your care. We strive to provide evidence-based and compassionate care and are always looking for feedback. If you get a survey from the clinic please complete this so we can hear your opinions.  Heart-Healthy Eating Plan Many factors influence your heart health, including eating and exercise habits. Heart health is also called coronary health. Coronary risk increases with abnormal blood fat (lipid) levels. A heart-healthy eating plan includes limiting unhealthy fats, increasing healthy fats, limiting salt (sodium) intake, and making other diet and lifestyle changes. What is my plan? Your health care provider may recommend that: You limit your fat intake to _________% or less of your total calories each day. You limit your saturated fat intake to _________% or less of your total calories each day. You limit the amount of cholesterol in your diet to less than _________ mg per day. You limit the amount of sodium in your diet to less than _________  mg per day. What are tips for following this plan? Cooking Cook foods using methods other than frying. Baking, boiling, grilling, and broiling are all good options. Other ways to reduce fat include: Removing the skin from poultry. Removing all visible fats from meats. Steaming vegetables in water or broth. Meal planning  At meals, imagine dividing your plate into fourths: Fill one-half of your plate with vegetables and green salads. Fill one-fourth of your plate with whole grains. Fill one-fourth of your plate with lean protein foods. Eat 2-4 cups of vegetables per day. One cup of vegetables equals 1 cup (91 g) broccoli or cauliflower florets, 2 medium carrots, 1 large bell pepper, 1 large sweet potato, 1 large tomato, 1 medium white potato, 2 cups (150 g) raw leafy greens. Eat 1-2 cups of fruit per day. One cup of fruit equals 1 small apple, 1 large banana, 1 cup (237 g) mixed fruit, 1 large orange,  cup (82 g) dried fruit, 1 cup (240 mL) 100% fruit juice. Eat more foods that contain soluble fiber. Examples include apples, broccoli, carrots, beans, peas, and barley. Aim to get 25-30 g of fiber per day. Increase your consumption of legumes, nuts, and seeds to 4-5 servings per week. One serving of dried beans or legumes equals  cup (90 g) cooked, 1 serving of nuts is  oz (12 almonds, 24 pistachios, or 7 walnut halves), and 1 serving of seeds equals  oz (8 g). Fats Choose healthy fats more often. Choose monounsaturated and polyunsaturated fats, such as olive and canola oils, avocado oil, flaxseeds, walnuts, almonds, and seeds. Eat  more omega-3 fats. Choose salmon, mackerel, sardines, tuna, flaxseed oil, and ground flaxseeds. Aim to eat fish at least 2 times each week. Check food labels carefully to identify foods with trans fats or high amounts of saturated fat. Limit saturated fats. These are found in animal products, such as meats, butter, and cream. Plant sources of saturated fats  include palm oil, palm kernel oil, and coconut oil. Avoid foods with partially hydrogenated oils in them. These contain trans fats. Examples are stick margarine, some tub margarines, cookies, crackers, and other baked goods. Avoid fried foods. General information Eat more home-cooked food and less restaurant, buffet, and fast food. Limit or avoid alcohol. Limit foods that are high in added sugar and simple starches such as foods made using white refined flour (white breads, pastries, sweets). Lose weight if you are overweight. Losing just 5-10% of your body weight can help your overall health and prevent diseases such as diabetes and heart disease. Monitor your sodium intake, especially if you have high blood pressure. Talk with your health care provider about your sodium intake. Try to incorporate more vegetarian meals weekly. What foods should I eat? Fruits All fresh, canned (in natural juice), or frozen fruits. Vegetables Fresh or frozen vegetables (raw, steamed, roasted, or grilled). Green salads. Grains Most grains. Choose whole wheat and whole grains most of the time. Rice and pasta, including brown rice and pastas made with whole wheat. Meats and other proteins Lean, well-trimmed beef, veal, pork, and lamb. Chicken and [place] without skin. All fish and shellfish. Wild duck, rabbit, pheasant, and venison. Egg whites or low-cholesterol egg substitutes. Dried beans, peas, lentils, and tofu. Seeds and most nuts. Dairy Low-fat or nonfat cheeses, including ricotta and mozzarella. Skim or 1% milk (liquid, powdered, or evaporated). Buttermilk made with low-fat milk. Nonfat or low-fat yogurt. Fats and oils Non-hydrogenated (trans-free) margarines. Vegetable oils, including soybean, sesame, sunflower, olive, avocado, peanut, safflower, corn, canola, and cottonseed. Salad dressings or mayonnaise made with a vegetable oil. Beverages Water (mineral or sparkling). Coffee and tea. Unsweetened ice  tea. Diet beverages. Sweets and desserts Sherbet, gelatin, and fruit ice. Small amounts of dark chocolate. Limit all sweets and desserts. Seasonings and condiments All seasonings and condiments. The items listed above may not be a complete list of foods and beverages you can eat. Contact a dietitian for more options. What foods should I avoid? Fruits Canned fruit in heavy syrup. Fruit in cream or butter sauce. Fried fruit. Limit coconut. Vegetables Vegetables cooked in cheese, cream, or butter sauce. Fried vegetables. Grains Breads made with saturated or trans fats, oils, or whole milk. Croissants. Sweet rolls. Donuts. High-fat crackers, such as cheese crackers and chips. Meats and other proteins Fatty meats, such as hot dogs, ribs, sausage, bacon, rib-eye roast or steak. High-fat deli meats, such as salami and bologna. Caviar. Domestic duck and goose. Organ meats, such as liver. Dairy Cream, sour cream, cream cheese, and creamed cottage cheese. Whole-milk cheeses. Whole or 2% milk (liquid, evaporated, or condensed). Whole buttermilk. Cream sauce or high-fat cheese sauce. Whole-milk yogurt. Fats and oils Meat fat, or shortening. Cocoa butter, hydrogenated oils, palm oil, coconut oil, palm kernel oil. Solid fats and shortenings, including bacon fat, salt pork, lard, and butter. Nondairy cream substitutes. Salad dressings with cheese or sour cream. Beverages Regular sodas and any drinks with added sugar. Sweets and desserts Frosting. Pudding. Cookies. Cakes. Pies. Milk chocolate or white chocolate. Buttered syrups. Full-fat ice cream or ice cream drinks. The items listed above may  not be a complete list of foods and beverages to avoid. Contact a dietitian for more information. Summary Heart-healthy meal planning includes limiting unhealthy fats, increasing healthy fats, limiting salt (sodium) intake and making other diet and lifestyle changes. Lose weight if you are overweight. Losing just  5-10% of your body weight can help your overall health and prevent diseases such as diabetes and heart disease. Focus on eating a balance of foods, including fruits and vegetables, low-fat or nonfat dairy, lean protein, nuts and legumes, whole grains, and heart-healthy oils and fats. This information is not intended to replace advice given to you by your health care provider. Make sure you discuss any questions you have with your health care provider. Document Revised: 09/25/2021 Document Reviewed: 09/25/2021 Elsevier Patient Education  2024 [employer].

## 2024-06-30 ENCOUNTER — Other Ambulatory Visit: Payer: Self-pay | Admitting: Internal Medicine

## 2024-06-30 ENCOUNTER — Ambulatory Visit

## 2024-06-30 DIAGNOSIS — R531 Weakness: Secondary | ICD-10-CM

## 2024-06-30 DIAGNOSIS — R079 Chest pain, unspecified: Secondary | ICD-10-CM

## 2024-06-30 DIAGNOSIS — E7800 Pure hypercholesterolemia, unspecified: Secondary | ICD-10-CM

## 2024-06-30 DIAGNOSIS — I2089 Other forms of angina pectoris: Secondary | ICD-10-CM

## 2024-06-30 DIAGNOSIS — R0602 Shortness of breath: Secondary | ICD-10-CM

## 2024-07-01 ENCOUNTER — Encounter: Payer: Self-pay | Admitting: Nurse Practitioner

## 2024-07-01 ENCOUNTER — Ambulatory Visit (INDEPENDENT_AMBULATORY_CARE_PROVIDER_SITE_OTHER): Admitting: Nurse Practitioner

## 2024-07-01 VITALS — BP 113/76 | HR 62 | Temp 98.2°F | Resp 15 | Ht 67.01 in | Wt 155.0 lb

## 2024-07-01 DIAGNOSIS — E538 Deficiency of other specified B group vitamins: Secondary | ICD-10-CM

## 2024-07-01 DIAGNOSIS — E782 Mixed hyperlipidemia: Secondary | ICD-10-CM | POA: Diagnosis not present

## 2024-07-01 NOTE — Assessment & Plan Note (Signed)
 Ongoing, started on Rosuvastatin 06/17/24 which she is tolerating well.  Continue this regimen and adjust as needed.  Plan on recheck of labs in 8 weeks.  Will continue collaboration with cardiology and neurology.

## 2024-07-01 NOTE — Progress Notes (Signed)
 BP 113/76 (BP Location: Left Arm, Patient Position: Sitting, Cuff Size: Normal)   Pulse 62   Temp 98.2 F (36.8 C) (Oral)   Resp 15   Ht 5' 7.01 (1.702 m)   Wt 155 lb (70.3 kg)   SpO2 98%   BMI 24.27 kg/m    Subjective:    Patient ID: Cindy Jefferson [facility], female    DOB: 1956/02/06, 68 y.o.   MRN: [id number]  HPI: Cindy Jefferson is a 68 y.o. female  Chief Complaint  Patient presents with   Discuss results    Wants to discuss results from neurology.     Irregular Heart Beat    Feels her heart beat is very fast, no pain. Some times can be forced to go lay down because the spells are so bad. SOB and shaky at times. Holter monitor completed and ECHO completed this morning. Describes it happens a lot when raising hands or after getting out of the shower.    HYPERLIPIDEMIA Started on Crestor 10 MG on 06/17/24, tolerating well. 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 Was advised to start statin therapy.  On 06/26/24 was seen by cardiology for possible angina pain, echo CTA ordered - had this morning. May start Plavix and ASA dependent on findings. Has been having some irregular beats (heart racing), just completed Holter and echo this morning.  Was told on recent sleep study, home study, that she had moderate to severe sleep apnea. She reports that during home test she did not sleep well though. Is going to repeat sleep study and retest. Hyperlipidemia status: good compliance Satisfied with current treatment?  yes Side effects:  no Medication compliance: good compliance Past cholesterol meds: Crestor Supplements: none Aspirin:  no The 10-year ASCVD risk score (Arnett DK, et al., 2019) is: 5.8%   Values used to calculate the score:     Age: 65 years     Clincally relevant sex: Female     Is Non-Hispanic African American: No     Diabetic: No     Tobacco smoker: No     Systolic  Blood Pressure: 113 mmHg     Is BP treated: No     HDL Cholesterol: 86 mg/dL     Total Cholesterol: 249 mg/dL Chest pain:  no Coronary artery disease:  yes Family history CAD:  yes Family history early CAD:  no   Relevant past medical, surgical, family and social history reviewed and updated as indicated. Interim medical history since our last visit reviewed. Allergies and medications reviewed and updated.  Review of Systems  Constitutional:  Negative for activity change, appetite change, diaphoresis, fatigue and fever.  Respiratory:  Negative for cough, chest tightness, shortness of breath and wheezing.   Cardiovascular:  Negative for chest pain, palpitations and leg swelling.  Gastrointestinal: Negative.   Neurological: Negative.   Psychiatric/Behavioral: Negative.      Per HPI unless specifically indicated above     Objective:    BP 113/76 (BP Location: Left Arm, Patient Position: Sitting, Cuff Size: Normal)   Pulse 62   Temp 98.2 F (36.8 C) (Oral)   Resp 15   Ht 5' 7.01 (1.702 m)   Wt 155 lb (70.3 kg)   SpO2 98%   BMI 24.27 kg/m   Wt Readings from Last 3 Encounters:  07/01/24 155 lb (70.3 kg)  05/18/24 158 lb (71.7 kg)  05/01/24 152 lb (68.9  kg)    Physical Exam Vitals and nursing note reviewed.  Constitutional:      General: She is awake. She is not in acute distress.    Appearance: She is well-developed and well-groomed. She is not ill-appearing or toxic-appearing.  HENT:     Head: Normocephalic.     Right Ear: Hearing and external ear normal.     Left Ear: Hearing and external ear normal.  Eyes:     General: Lids are normal.        Right eye: No discharge.        Left eye: No discharge.     Conjunctiva/sclera: Conjunctivae normal.     Pupils: Pupils are equal, round, and reactive to light.  Neck:     Thyroid : No thyromegaly.     Vascular: No carotid bruit.  Cardiovascular:     Rate and Rhythm: Normal rate and regular rhythm.     Heart sounds:  Normal heart sounds. No murmur heard.    No gallop.  Pulmonary:     Effort: Pulmonary effort is normal. No accessory muscle usage or respiratory distress.     Breath sounds: Normal breath sounds.  Abdominal:     General: Bowel sounds are normal. There is no distension.     Palpations: Abdomen is soft.     Tenderness: There is no abdominal tenderness.  Musculoskeletal:     Cervical back: Normal range of motion and neck supple.     Right lower leg: No edema.     Left lower leg: No edema.  Lymphadenopathy:     Cervical: No cervical adenopathy.  Skin:    General: Skin is warm and dry.  Neurological:     Mental Status: She is alert and oriented to person, place, and time.     Deep Tendon Reflexes: Reflexes are normal and symmetric.     Reflex Scores:      Brachioradialis reflexes are 2+ on the right side and 2+ on the left side.      Patellar reflexes are 2+ on the right side and 2+ on the left side. Psychiatric:        Attention and Perception: Attention normal.        Mood and Affect: Mood normal.        Speech: Speech normal.        Behavior: Behavior normal. Behavior is cooperative.        Thought Content: Thought content normal.    Results for orders placed or performed in visit on 04/30/24  T4, free   Collection Time: 04/30/24  8:38 AM  Result Value Ref Range   Free T4 1.65 0.82 - 1.77 ng/dL  CBC with Differential/Platelet   Collection Time: 04/30/24  8:38 AM  Result Value Ref Range   WBC 4.2 3.4 - 10.8 x10E3/uL   RBC 4.73 3.77 - 5.28 x10E6/uL   Hemoglobin 14.0 11.1 - 15.9 g/dL   Hematocrit 55.8 65.9 - 46.6 %   MCV 93 79 - 97 fL   MCH 29.6 26.6 - 33.0 pg   MCHC 31.7 31.5 - 35.7 g/dL   RDW 87.9 88.2 - 84.5 %   Platelets 242 150 - 450 x10E3/uL   Neutrophils 52 Not Estab. %   Lymphs 38 Not Estab. %   Monocytes 8 Not Estab. %   Eos 1 Not Estab. %   Basos 1 Not Estab. %   Neutrophils Absolute 2.2 1.4 - 7.0 x10E3/uL   Lymphocytes Absolute 1.6 0.7 -  3.1 x10E3/uL    Monocytes Absolute 0.3 0.1 - 0.9 x10E3/uL   EOS (ABSOLUTE) 0.0 0.0 - 0.4 x10E3/uL   Basophils Absolute 0.1 0.0 - 0.2 x10E3/uL   Immature Granulocytes 0 Not Estab. %   Immature Grans (Abs) 0.0 0.0 - 0.1 x10E3/uL  Comprehensive metabolic panel with GFR   Collection Time: 04/30/24  8:38 AM  Result Value Ref Range   Glucose 82 70 - 99 mg/dL   BUN 15 8 - 27 mg/dL   Creatinine, Ser 9.13 0.57 - 1.00 mg/dL   eGFR 74 >40 fO/fpw/8.26   BUN/Creatinine Ratio 17 12 - 28   Sodium 138 134 - 144 mmol/L   Potassium 4.4 3.5 - 5.2 mmol/L   Chloride 100 96 - 106 mmol/L   CO2 24 20 - 29 mmol/L   Calcium 9.2 8.7 - 10.3 mg/dL   Total Protein 6.6 6.0 - 8.5 g/dL   Albumin 4.5 3.9 - 4.9 g/dL   Globulin, Total 2.1 1.5 - 4.5 g/dL   Bilirubin Total 0.6 0.0 - 1.2 mg/dL   Alkaline Phosphatase 95 44 - 121 IU/L   AST 24 0 - 40 IU/L   ALT 17 0 - 32 IU/L  Lipid Panel w/o Chol/HDL Ratio   Collection Time: 04/30/24  8:38 AM  Result Value Ref Range   Cholesterol, Total 249 (H) 100 - 199 mg/dL   Triglycerides 75 0 - 149 mg/dL   HDL 86 >60 mg/dL   VLDL Cholesterol Cal 12 5 - 40 mg/dL   LDL Chol Calc (NIH) 848 (H) 0 - 99 mg/dL  TSH   Collection Time: 04/30/24  8:38 AM  Result Value Ref Range   TSH 0.965 0.450 - 4.500 uIU/mL  VITAMIN D  25 Hydroxy (Vit-D Deficiency, Fractures)   Collection Time: 04/30/24  8:38 AM  Result Value Ref Range   Vit D, 25-Hydroxy 47.6 30.0 - 100.0 ng/mL      Assessment & Plan:   Problem List Items Addressed This Visit       Other   Mixed hyperlipidemia - Primary   Ongoing, started on Rosuvastatin 06/17/24 which she is tolerating well.  Continue this regimen and adjust as needed.  Plan on recheck of labs in 8 weeks.  Will continue collaboration with cardiology and neurology.        Follow up plan: Return in about 8 weeks (around 08/26/2024) for HLD.

## 2024-07-02 ENCOUNTER — Ambulatory Visit

## 2024-07-16 ENCOUNTER — Encounter (HOSPITAL_COMMUNITY): Payer: Self-pay

## 2024-07-17 ENCOUNTER — Telehealth (HOSPITAL_COMMUNITY): Payer: Self-pay | Admitting: *Deleted

## 2024-07-17 NOTE — Telephone Encounter (Signed)
 Attempted to call patient regarding upcoming cardiac CT appointment. Left message on voicemail with name and callback number  Larey Brick RN Navigator Cardiac Imaging [facility] [phone] Office [phone] Cell

## 2024-07-20 ENCOUNTER — Ambulatory Visit
Admission: RE | Admit: 2024-07-20 | Discharge: 2024-07-20 | Disposition: A | Discharge status: Home / Self Care | Source: Ambulatory Visit | Attending: Internal Medicine | Admitting: Internal Medicine

## 2024-07-20 DIAGNOSIS — R531 Weakness: Secondary | ICD-10-CM | POA: Diagnosis present

## 2024-07-20 DIAGNOSIS — E7800 Pure hypercholesterolemia, unspecified: Secondary | ICD-10-CM | POA: Insufficient documentation

## 2024-07-20 DIAGNOSIS — I2089 Other forms of angina pectoris: Secondary | ICD-10-CM | POA: Diagnosis present

## 2024-07-20 DIAGNOSIS — R079 Chest pain, unspecified: Secondary | ICD-10-CM | POA: Diagnosis present

## 2024-07-20 DIAGNOSIS — R0602 Shortness of breath: Secondary | ICD-10-CM | POA: Diagnosis present

## 2024-07-20 MED ORDER — NITROGLYCERIN 0.4 MG SL SUBL
0.8000 mg | SUBLINGUAL_TABLET | Freq: Once | SUBLINGUAL | Status: AC
  Administered 2024-07-20: 0.8 mg via SUBLINGUAL

## 2024-07-20 MED ORDER — METOPROLOL TARTRATE 5 MG/5ML IV SOLN: 10.0000 mg | INTRAVENOUS | Status: DC | PRN

## 2024-07-20 MED ORDER — IOHEXOL 350 MG/ML SOLN
100.0000 mL | Freq: Once | INTRAVENOUS | Status: AC | PRN
  Administered 2024-07-20: 100 mL via INTRAVENOUS

## 2024-07-20 MED ORDER — DILTIAZEM HCL 25 MG/5ML IV SOLN: 10.0000 mg | INTRAVENOUS | Status: DC | PRN

## 2024-07-29 ENCOUNTER — Ambulatory Visit

## 2024-08-05 ENCOUNTER — Ambulatory Visit

## 2024-08-06 ENCOUNTER — Ambulatory Visit

## 2024-08-20 ENCOUNTER — Ambulatory Visit: Admitting: Dermatology

## 2024-08-20 ENCOUNTER — Encounter: Payer: Self-pay | Admitting: Dermatology

## 2024-08-20 DIAGNOSIS — D485 Neoplasm of uncertain behavior of skin: Secondary | ICD-10-CM

## 2024-08-20 DIAGNOSIS — Z09 Encounter for follow-up examination after completed treatment for conditions other than malignant neoplasm: Secondary | ICD-10-CM | POA: Diagnosis not present

## 2024-08-20 DIAGNOSIS — Z872 Personal history of diseases of the skin and subcutaneous tissue: Secondary | ICD-10-CM

## 2024-08-20 NOTE — Progress Notes (Signed)
° °  Follow-Up Visit   Subjective  Cindy Jefferson is a 68 y.o. female who presents for the following: patient had a scab at scalp for a few months and it fell off 2 days ago. Patient can still feel a little scale. No tenderness or bleeding. No personal hx skin cancer.   The following portions of the chart were reviewed this encounter and updated as appropriate: medications, allergies, medical history  Review of Systems:  No other skin or systemic complaints except as noted in HPI or Assessment and Plan.  Objective  Well appearing patient in no apparent distress; mood and affect are within normal limits.    A focused examination was performed of the following areas: scalp  Relevant exam findings are noted in the Assessment and Plan.    Assessment & Plan   Favor resolved ISK at scalp Exam: clear in affected area  Treatment Plan: None    NEOPLASM OF UNCERTAIN BEHAVIOR OF SKIN    Return for TBSE, as scheduled, with Dr. Claudene, FhxMM.  LILLETTE Lonell Drones, RMA, am acting as scribe for Boneta Claudene, MD .   Documentation: I have reviewed the above documentation for accuracy and completeness, and I agree with the above.  Boneta Claudene, MD

## 2024-08-20 NOTE — Patient Instructions (Signed)

## 2024-08-31 ENCOUNTER — Ambulatory Visit: Admitting: Nurse Practitioner

## 2024-09-01 ENCOUNTER — Ambulatory Visit

## 2024-09-07 ENCOUNTER — Other Ambulatory Visit: Payer: Self-pay

## 2024-09-07 ENCOUNTER — Emergency Department
Admission: EM | Admit: 2024-09-07 | Discharge: 2024-09-07 | Disposition: A | Discharge status: Home / Self Care | Attending: Emergency Medicine | Admitting: Emergency Medicine

## 2024-09-07 ENCOUNTER — Emergency Department

## 2024-09-07 DIAGNOSIS — E039 Hypothyroidism, unspecified: Secondary | ICD-10-CM | POA: Diagnosis not present

## 2024-09-07 DIAGNOSIS — R519 Headache, unspecified: Secondary | ICD-10-CM | POA: Diagnosis present

## 2024-09-07 LAB — CBC
HCT: 40.1 % (ref 36.0–46.0)
Hemoglobin: 13.2 g/dL (ref 12.0–15.0)
MCH: 29.7 pg (ref 26.0–34.0)
MCHC: 32.9 g/dL (ref 30.0–36.0)
MCV: 90.1 fL (ref 80.0–100.0)
Platelets: 224 K/uL (ref 150–400)
RBC: 4.45 MIL/uL (ref 3.87–5.11)
RDW: 12.2 % (ref 11.5–15.5)
WBC: 6.2 K/uL (ref 4.0–10.5)
nRBC: 0 % (ref 0.0–0.2)

## 2024-09-07 LAB — BASIC METABOLIC PANEL WITH GFR
Anion gap: 13 (ref 5–15)
BUN: 14 mg/dL (ref 8–23)
CO2: 26 mmol/L (ref 22–32)
Calcium: 9.1 mg/dL (ref 8.9–10.3)
Chloride: 102 mmol/L (ref 98–111)
Creatinine, Ser: 0.73 mg/dL (ref 0.44–1.00)
GFR, Estimated: >60 mL/min
Glucose, Bld: 84 mg/dL (ref 70–99)
Potassium: 4.1 mmol/L (ref 3.5–5.1)
Sodium: 140 mmol/L (ref 135–145)

## 2024-09-07 MED ORDER — DIPHENHYDRAMINE HCL 50 MG/ML IJ SOLN
25.0000 mg | Freq: Once | INTRAMUSCULAR | Status: AC
  Administered 2024-09-07: 25 mg via INTRAVENOUS
  Filled 2024-09-07: qty 1

## 2024-09-07 MED ORDER — SODIUM CHLORIDE 0.9 % IV BOLUS
1000.0000 mL | Freq: Once | INTRAVENOUS | Status: AC
  Administered 2024-09-07: 1000 mL via INTRAVENOUS

## 2024-09-07 MED ORDER — PROCHLORPERAZINE EDISYLATE 10 MG/2ML IJ SOLN
10.0000 mg | Freq: Once | INTRAMUSCULAR | Status: AC
  Administered 2024-09-07: 10 mg via INTRAVENOUS
  Filled 2024-09-07: qty 2

## 2024-09-07 NOTE — ED Provider Notes (Signed)
 "  [facility] Provider Note    Event Date/Time   First MD Initiated Contact with Patient 09/07/24 2027     (approximate)   History   Chief Complaint Headache   HPI  Cindy Jefferson is a 69 y.o. female with past medical history of hyperlipidemia, hypothyroidism, and migraines who presents to the ED complaining of headache.  Patient reports that she had sudden onset of bilateral posterior headache around 5:30 PM this evening.  She states that she was under significant amount of stress at the time due to visiting a sick family member at the hospital.  She does report recurrent migraine headaches, but states current symptoms feel different.  She denies any fevers or neck stiffness and has not had any vision changes, speech changes, numbness, or weakness.  She took a dose of her usual migraine medicine with partial relief.     Physical Exam   Triage Vital Signs: ED Triage Vitals  Encounter Vitals Group     BP 09/07/24 1728 (!) 156/79     Girls Systolic BP Percentile --      Girls Diastolic BP Percentile --      Boys Systolic BP Percentile --      Boys Diastolic BP Percentile --      Pulse Rate 09/07/24 1726 75     Resp 09/07/24 1726 20     Temp 09/07/24 1726 97.8 F (36.6 C)     Temp Source 09/07/24 1726 Oral     SpO2 09/07/24 1726 98 %     Weight --      Height --      Head Circumference --      Peak Flow --      Pain Score 09/07/24 1726 9     Pain Loc --      Pain Education --      Exclude from Growth Chart --     Most recent vital signs: Vitals:   09/07/24 1728 09/07/24 2135  BP: (!) 156/79   Pulse:    Resp:    Temp:  98.3 F (36.8 C)  SpO2:      Constitutional: Alert and oriented. Eyes: Conjunctivae are normal. Head: Atraumatic. Nose: No congestion/rhinnorhea. Mouth/Throat: Mucous membranes are moist.  Neck: Supple with no meningismus. Cardiovascular: Normal rate, regular rhythm. Grossly normal heart sounds.  2+ radial pulses  bilaterally. Respiratory: Normal respiratory effort.  No retractions. Lungs CTAB. Gastrointestinal: Soft and nontender. No distention. Musculoskeletal: No lower extremity tenderness nor edema.  Neurologic:  Normal speech and language. No gross focal neurologic deficits are appreciated.    ED Results / Procedures / Treatments   Labs (all labs ordered are listed, but only abnormal results are displayed) Labs Reviewed  CBC  BASIC METABOLIC PANEL WITH GFR    RADIOLOGY CT head reviewed and interpreted by me with no hemorrhage or midline shift.  PROCEDURES:  Critical Care performed: No  Procedures   MEDICATIONS ORDERED IN ED: Medications  prochlorperazine  (COMPAZINE ) injection 10 mg (10 mg Intravenous Given 09/07/24 2130)  diphenhydrAMINE  (BENADRYL ) injection 25 mg (25 mg Intravenous Given 09/07/24 2129)  sodium chloride  0.9 % bolus 1,000 mL (0 mLs Intravenous Stopped 09/07/24 2217)  diphenhydrAMINE  (BENADRYL ) injection 25 mg (25 mg Intravenous Given 09/07/24 2145)     IMPRESSION / MDM / ASSESSMENT AND PLAN / ED COURSE  I reviewed the triage vital signs and the nursing notes.  69 y.o. female with past medical history of hyperlipidemia, hypothyroidism, and migraines who presents to the ED complaining of acute onset headache about an hour prior to arrival.  Patient's presentation is most consistent with acute presentation with potential threat to life or bodily function.  Differential diagnosis includes, but is not limited to, tension headache, migraine headache, SAH, meningitis, occipital neuralgia.  Patient well-appearing and in no acute distress, vital signs are unremarkable.  She has a nonfocal neurologic exam and CT head is negative for acute process, doubt SAH given imaging within 6 hours of onset.  No features concerning for meningitis and labs are reassuring without significant anemia, leukocytosis, electrolyte abnormality, or AKI.  Patient had  improvement in symptoms following IV Compazine  and Benadryl , is now requesting to be discharged home.  She was counseled to follow-up with her PCP, otherwise return to the ED for new or worsening symptoms.  Patient agrees with plan.      FINAL CLINICAL IMPRESSION(S) / ED DIAGNOSES   Final diagnoses:  Acute nonintractable headache, unspecified headache type     Rx / DC Orders   ED Discharge Orders     None        Note:  This document was prepared using Dragon voice recognition software and may include unintentional dictation errors.   Willo Dunnings, MD 09/07/24 2307  "

## 2024-09-07 NOTE — ED Notes (Signed)
 Since migraine first appeared, pt took her home med Nurtec. States pain is now 5/10 instead of 10/10. Nurtec normally completely resolves migraines. She stated this posterior migraine has occurred only once before, was not stress induced, and was resolved with some kind of shot at the ER.

## 2024-09-07 NOTE — ED Triage Notes (Addendum)
 Pt to ED via POV from waiting room. Pt reports stepdad is here and is very sick and pt went out to the car and started to have a posterior HA. Pt reports hx of migraines but this feels different. Pt tearful in triage. Pt also endorses some nausea. Pt appears anxious in triage.

## 2024-09-08 ENCOUNTER — Ambulatory Visit

## 2024-09-15 ENCOUNTER — Encounter: Payer: Self-pay | Admitting: Nurse Practitioner

## 2024-09-15 ENCOUNTER — Ambulatory Visit

## 2024-09-15 ENCOUNTER — Ambulatory Visit (INDEPENDENT_AMBULATORY_CARE_PROVIDER_SITE_OTHER): Admitting: Nurse Practitioner

## 2024-09-15 VITALS — BP 116/74 | HR 66 | Temp 97.5°F | Resp 17 | Ht 67.01 in | Wt 160.0 lb

## 2024-09-15 DIAGNOSIS — G4733 Obstructive sleep apnea (adult) (pediatric): Secondary | ICD-10-CM

## 2024-09-15 DIAGNOSIS — E782 Mixed hyperlipidemia: Secondary | ICD-10-CM | POA: Diagnosis not present

## 2024-09-15 DIAGNOSIS — E538 Deficiency of other specified B group vitamins: Secondary | ICD-10-CM

## 2024-09-15 NOTE — Assessment & Plan Note (Signed)
 Ongoing, started on Rosuvastatin  06/17/24 which she is tolerating well.  Continue this regimen and adjust as needed. Recheck labs today. Will continue collaboration with cardiology and neurology.

## 2024-09-15 NOTE — Progress Notes (Signed)
 "  BP 116/74 (BP Location: Left Arm, Patient Position: Sitting, Cuff Size: Normal)   Pulse 66   Temp (!) 97.5 F (36.4 C) (Oral)   Resp 17   Ht 5' 7.01 (1.702 m)   Wt 160 lb (72.6 kg)   SpO2 98%   BMI 25.05 kg/m    Subjective:    Patient ID: Cindy Jefferson, female    DOB: 17-Aug-1956, 69 y.o.   MRN: [id number]  HPI: Cindy Jefferson is a 69 y.o. female  Chief Complaint  Patient presents with   Follow-up    Follow up   HYPERLIPIDEMIA Follow-up today due to starting Rosuvastatin  recently. Hyperlipidemia status: good compliance Satisfied with current treatment?  yes Side effects:  no Medication compliance: good compliance Past cholesterol meds: Rosuvastatin  Supplements: none Aspirin:  no The 10-year ASCVD risk score (Arnett DK, et al., 2019) is: 6.1%   Values used to calculate the score:     Age: 40 years     Clinically relevant sex: Female     Is Non-Hispanic African American: No     Diabetic: No     Tobacco smoker: No     Systolic Blood Pressure: 116 mmHg     Is BP treated: No     HDL Cholesterol: 86 mg/dL     Total Cholesterol: 249 mg/dL Chest pain:  no Coronary artery disease:  no Family history CAD:  yes Family history early CAD:  no   SLEEP APNEA Has been having issues with CPAP, waking up with lots of drainage and this lasts all day. She would like second opinion on sleep apnea, would like in house study. Sleep apnea status: stable Duration: chronic Satisfied with current treatment?:  no CPAP use:  yes Sleep quality with CPAP use: poor Treament compliance:good compliance Last sleep study: 07/03/24 Treatments attempted: CPAP Wakes feeling refreshed:  no Daytime hypersomnolence:  no Fatigue:  no Insomnia:  no Good sleep hygiene:  yes Difficulty falling asleep:  no Difficulty staying asleep:  no Snoring bothers bed partner:  no Observed apnea by bed partner: no Obesity:  no Hypertension: no  Pulmonary hypertension:  no Coronary artery disease:   no  Relevant past medical, surgical, family and social history reviewed and updated as indicated. Interim medical history since our last visit reviewed. Allergies and medications reviewed and updated.  Review of Systems  Constitutional:  Negative for activity change, appetite change, diaphoresis, fatigue and fever.  Respiratory:  Negative for cough, chest tightness, shortness of breath and wheezing.   Cardiovascular:  Negative for chest pain, palpitations and leg swelling.  Gastrointestinal: Negative.   Neurological: Negative.   Psychiatric/Behavioral: Negative.      Per HPI unless specifically indicated above     Objective:    BP 116/74 (BP Location: Left Arm, Patient Position: Sitting, Cuff Size: Normal)   Pulse 66   Temp (!) 97.5 F (36.4 C) (Oral)   Resp 17   Ht 5' 7.01 (1.702 m)   Wt 160 lb (72.6 kg)   SpO2 98%   BMI 25.05 kg/m   Wt Readings from Last 3 Encounters:  09/15/24 160 lb (72.6 kg)  07/01/24 155 lb (70.3 kg)  05/18/24 158 lb (71.7 kg)    Physical Exam Vitals and nursing note reviewed.  Constitutional:      General: She is awake. She is not in acute distress.    Appearance: She is well-developed and well-groomed. She is not ill-appearing or toxic-appearing.  HENT:  Head: Normocephalic.     Right Ear: Hearing and external ear normal.     Left Ear: Hearing and external ear normal.  Eyes:     General: Lids are normal.        Right eye: No discharge.        Left eye: No discharge.     Conjunctiva/sclera: Conjunctivae normal.     Pupils: Pupils are equal, round, and reactive to light.  Neck:     Thyroid : No thyromegaly.     Vascular: No carotid bruit.  Cardiovascular:     Rate and Rhythm: Normal rate and regular rhythm.     Heart sounds: Normal heart sounds. No murmur heard.    No gallop.  Pulmonary:     Effort: Pulmonary effort is normal. No accessory muscle usage or respiratory distress.     Breath sounds: Normal breath sounds.  Abdominal:      General: Bowel sounds are normal. There is no distension.     Palpations: Abdomen is soft.     Tenderness: There is no abdominal tenderness.  Musculoskeletal:     Cervical back: Normal range of motion and neck supple.     Right lower leg: No edema.     Left lower leg: No edema.  Lymphadenopathy:     Cervical: No cervical adenopathy.  Skin:    General: Skin is warm and dry.  Neurological:     Mental Status: She is alert and oriented to person, place, and time.     Deep Tendon Reflexes: Reflexes are normal and symmetric.     Reflex Scores:      Brachioradialis reflexes are 2+ on the right side and 2+ on the left side.      Patellar reflexes are 2+ on the right side and 2+ on the left side. Psychiatric:        Attention and Perception: Attention normal.        Mood and Affect: Mood normal.        Speech: Speech normal.        Behavior: Behavior normal. Behavior is cooperative.        Thought Content: Thought content normal.     Results for orders placed or performed during the hospital encounter of 09/07/24  CBC   Collection Time: 09/07/24  5:29 PM  Result Value Ref Range   WBC 6.2 4.0 - 10.5 K/uL   RBC 4.45 3.87 - 5.11 MIL/uL   Hemoglobin 13.2 12.0 - 15.0 g/dL   HCT 59.8 63.9 - 53.9 %   MCV 90.1 80.0 - 100.0 fL   MCH 29.7 26.0 - 34.0 pg   MCHC 32.9 30.0 - 36.0 g/dL   RDW 87.7 88.4 - 84.4 %   Platelets 224 150 - 400 K/uL   nRBC 0.0 0.0 - 0.2 %  Basic metabolic panel   Collection Time: 09/07/24  5:29 PM  Result Value Ref Range   Sodium 140 135 - 145 mmol/L   Potassium 4.1 3.5 - 5.1 mmol/L   Chloride 102 98 - 111 mmol/L   CO2 26 22 - 32 mmol/L   Glucose, Bld 84 70 - 99 mg/dL   BUN 14 8 - 23 mg/dL   Creatinine, Ser 9.26 0.44 - 1.00 mg/dL   Calcium  9.1 8.9 - 10.3 mg/dL   GFR, Estimated >39 >39 mL/min   Anion gap 13 5 - 15      Assessment & Plan:   Problem List Items Addressed This Visit  Respiratory   OSA (obstructive sleep apnea)   Study on 07/03/24,  ordered by neurology. Had [facility] study which she feels was not accurate. Would like in office sleep study. Will place referral for this. Continue CPAP at this time.      Relevant Orders   Ambulatory referral to Sleep Studies     Other   Mixed hyperlipidemia - Primary   Ongoing, started on Rosuvastatin  06/17/24 which she is tolerating well.  Continue this regimen and adjust as needed. Recheck labs today. Will continue collaboration with cardiology and neurology.      Relevant Orders   Comprehensive metabolic panel with GFR   Lipid Panel w/o Chol/HDL Ratio     Follow up plan: Return in about 8 months (around 05/03/2025) for Annual Physical after 04/30/25.      "

## 2024-09-15 NOTE — Assessment & Plan Note (Signed)
 Study on 07/03/24, ordered by neurology. Had home study which she feels was not accurate. Would like in office sleep study. Will place referral for this. Continue CPAP at this time.

## 2024-09-15 NOTE — Patient Instructions (Signed)
 Living With Sleep Apnea Sleep apnea is a condition that affects your breathing while you're sleeping. Your tongue or the tissue in your throat may block the flow of air while you sleep. You may have shallow breathing or stop breathing for short periods of time. The breaks in breathing interrupt the deep sleep that you need to feel rested. Even if you don't wake up from the gaps in breathing, you may feel tired during the day. People with sleep apnea may snore loudly. You may have a headache in the morning and feel anxious or depressed. How can sleep apnea affect me? Sleep apnea increases your chances of being very tired during the day. This is called daytime fatigue. Sleep apnea can also increase your risk of: Heart attack. Stroke. Obesity. Type 2 diabetes. Heart failure. Irregular heartbeat. High blood pressure. If you are very tired during the day, you may be more likely to: Not do well in school or at work. Fall asleep while driving. Have trouble paying attention. Develop depression or anxiety. Have problems having sex. This is called sexual dysfunction. What actions can I take to manage sleep apnea? Sleep apnea treatment  If you were given a device to open your airway while you sleep, use it only as told by your health care provider. You may be given: An oral appliance. This is a mouthpiece that shifts your lower jaw forward. A continuous positive airway pressure (CPAP) device. This blows air through a mask. A nasal expiratory positive airway pressure (EPAP) device. This has valves that you put into each nostril. A bi-level positive airway pressure (BIPAP) device. This blows air through a mask when you breathe in and breathe out. You may need surgery if other treatments don't work for you. Sleep habits Go to sleep and wake up at the same time every day. This helps set your internal clock for sleeping. If you stay up later than usual on weekends, try to get up in the morning within 2  hours of the time you usually wake up. Try to get at least 7-9 hours of sleep each night. Stop using a computer, tablet, and mobile phone a few hours before bedtime. Do not take long naps during the day. If you nap, limit it to 30 minutes. Have a relaxing bedtime routine. Reading or listening to music may relax you and help you sleep. Use your bedroom only for sleep. Keep your television and computer out of your bedroom. Keep your bedroom cool, dark, and quiet. Use a supportive mattress and pillows. Follow your provider's instructions for other changes to sleep habits. Nutrition Do not eat big meals in the evening. Do not have caffeine in the later part of the day. The effects of caffeine can last for more than 5 hours. Follow your provider's instructions for any changes to what you eat and drink. Lifestyle Do not drink alcohol before bedtime. Alcohol can cause you to fall asleep at first, but then it can cause you to wake up in the middle of the night and have trouble getting back to sleep. Do not smoke, vape, or use nicotine or tobacco. Medicines Take over-the-counter and prescription medicines only as told by your provider. Do not use over-the-counter sleep medicine. You may become dependent on this medicine, and it can make sleep apnea worse. Do not take medicines, such as sedatives and narcotics, unless told to by your provider. Activity Exercise on most days, but avoid exercising in the evening. Exercising near bedtime can interfere with sleeping.  If possible, spend time outside every day. Natural light helps with your internal clock. General information Lose weight if you need to. Stay at a healthy weight. If you are having surgery, make sure to tell your provider that you have sleep apnea. You may need to bring your device with you. Keep all follow-up visits. Your provider will want to check on your condition. Where to find more information National Heart, Lung, and Blood  Institute: [employer] This information is not intended to replace advice given to you by your health care provider. Make sure you discuss any questions you have with your health care provider. Document Revised: 12/12/2022 Document Reviewed: 12/12/2022 Elsevier Patient Education  2024 [employer].

## 2024-09-16 ENCOUNTER — Ambulatory Visit: Payer: Self-pay | Admitting: Nurse Practitioner

## 2024-09-16 LAB — LIPID PANEL W/O CHOL/HDL RATIO
Cholesterol, Total: 160 mg/dL (ref 100–199)
HDL: 87 mg/dL
LDL Chol Calc (NIH): 58 mg/dL (ref 0–99)
Triglycerides: 82 mg/dL (ref 0–149)
VLDL Cholesterol Cal: 15 mg/dL (ref 5–40)

## 2024-09-16 LAB — COMPREHENSIVE METABOLIC PANEL WITH GFR
ALT: 20 IU/L (ref 0–32)
AST: 28 IU/L (ref 0–40)
Albumin: 4.2 g/dL (ref 3.9–4.9)
Alkaline Phosphatase: 102 IU/L (ref 49–135)
BUN/Creatinine Ratio: 17 (ref 12–28)
BUN: 16 mg/dL (ref 8–27)
Bilirubin Total: 0.3 mg/dL (ref 0.0–1.2)
CO2: 26 mmol/L (ref 20–29)
Calcium: 9.2 mg/dL (ref 8.7–10.3)
Chloride: 101 mmol/L (ref 96–106)
Creatinine, Ser: 0.93 mg/dL (ref 0.57–1.00)
Globulin, Total: 2.3 g/dL (ref 1.5–4.5)
Glucose: 65 mg/dL — ABNORMAL LOW (ref 70–99)
Potassium: 4.1 mmol/L (ref 3.5–5.2)
Sodium: 140 mmol/L (ref 134–144)
Total Protein: 6.5 g/dL (ref 6.0–8.5)
eGFR: 67 mL/min/1.73

## 2024-09-16 NOTE — Progress Notes (Signed)
 Contacted via MyChart  Good morning Serrina, your labs have returned and levels much improved. Continue current medication. Great news!!

## 2024-09-24 ENCOUNTER — Encounter: Payer: Self-pay | Admitting: Dermatology

## 2024-09-24 ENCOUNTER — Ambulatory Visit (INDEPENDENT_AMBULATORY_CARE_PROVIDER_SITE_OTHER): Payer: BLUE CROSS/BLUE SHIELD | Admitting: Dermatology

## 2024-09-24 DIAGNOSIS — D1801 Hemangioma of skin and subcutaneous tissue: Secondary | ICD-10-CM

## 2024-09-24 DIAGNOSIS — W908XXA Exposure to other nonionizing radiation, initial encounter: Secondary | ICD-10-CM | POA: Diagnosis not present

## 2024-09-24 DIAGNOSIS — L821 Other seborrheic keratosis: Secondary | ICD-10-CM | POA: Diagnosis not present

## 2024-09-24 DIAGNOSIS — L578 Other skin changes due to chronic exposure to nonionizing radiation: Secondary | ICD-10-CM

## 2024-09-24 DIAGNOSIS — L814 Other melanin hyperpigmentation: Secondary | ICD-10-CM | POA: Diagnosis not present

## 2024-09-24 DIAGNOSIS — Z808 Family history of malignant neoplasm of other organs or systems: Secondary | ICD-10-CM | POA: Diagnosis not present

## 2024-09-24 DIAGNOSIS — D229 Melanocytic nevi, unspecified: Secondary | ICD-10-CM

## 2024-09-24 DIAGNOSIS — Z872 Personal history of diseases of the skin and subcutaneous tissue: Secondary | ICD-10-CM | POA: Diagnosis not present

## 2024-09-24 DIAGNOSIS — Z1283 Encounter for screening for malignant neoplasm of skin: Secondary | ICD-10-CM

## 2024-09-24 NOTE — Patient Instructions (Signed)

## 2024-09-24 NOTE — Progress Notes (Signed)
" ° °  Follow-Up Visit   Subjective  Cindy Jefferson is a 69 y.o. female who presents for the following: Skin Cancer Screening and Full Body Skin Exam. Hx of AKs. No personal Hx of skin cancer or dysplastic nevi. Family Hx of skin cancer.  C/O rough spots on scalp.   The patient presents for Total-Body Skin Exam (TBSE) for skin cancer screening and mole check. The patient has spots, moles and lesions to be evaluated, some may be new or changing and the patient may have concern these could be cancer.    The following portions of the chart were reviewed this encounter and updated as appropriate: medications, allergies, medical history  Review of Systems:  No other skin or systemic complaints except as noted in HPI or Assessment and Plan.  Objective  Well appearing patient in no apparent distress; mood and affect are within normal limits.  A full examination was performed including scalp, head, eyes, ears, nose, lips, neck, chest, axillae, abdomen, back, buttocks, bilateral upper extremities, bilateral lower extremities, hands, feet, fingers, toes, fingernails, and toenails. All findings within normal limits unless otherwise noted below.   Exam of nails limited by presence of nail polish.   Relevant physical exam findings are noted in the Assessment and Plan.    Assessment & Plan   SKIN CANCER SCREENING PERFORMED TODAY.  FAMILY HISTORY OF SKIN CANCER What type(s): melanoma, BCC, SCC Who affected: sister  ACTINIC DAMAGE - Chronic condition, secondary to cumulative UV/sun exposure - diffuse scaly erythematous macules with underlying dyspigmentation - Recommend daily broad spectrum sunscreen SPF 30+ to sun-exposed areas, reapply every 2 hours as needed.  - Staying in the shade or wearing long sleeves, sun glasses (UVA+UVB protection) and wide brim hats (4-inch brim around the entire circumference of the hat) are also recommended for sun protection.  - Call for new or changing  lesions.  LENTIGINES, SEBORRHEIC KERATOSES, HEMANGIOMAS - Benign normal skin lesions - Benign-appearing - Call for any changes  MELANOCYTIC NEVI - Tan-brown and/or pink-flesh-colored symmetric macules and papules - Benign appearing on exam today - Observation - Call clinic for new or changing moles - Recommend daily use of broad spectrum spf 30+ sunscreen to sun-exposed areas.  - Check nails when remove polish.   SEBORRHEIC KERATOSIS - Stuck-on, waxy, tan-brown papules at scalp  - Benign-appearing - Discussed benign etiology and prognosis. - Observe - Call for any changes   Hx of AK  Exam: pink macule at left upper cutaneous lip  Treatment: Observe.    LENTIGINES   MULTIPLE BENIGN NEVI   ACTINIC ELASTOSIS   SEBORRHEIC KERATOSES   CHERRY ANGIOMA   Return in about 1 year (around 09/24/2025) for TBSE.  I, Kate Fought, CMA, am acting as scribe for Boneta Sharps, MD.   Documentation: I have reviewed the above documentation for accuracy and completeness, and I agree with the above.  Boneta Sharps, MD    "

## 2024-09-25 ENCOUNTER — Telehealth: Admitting: Nurse Practitioner

## 2024-09-25 ENCOUNTER — Ambulatory Visit: Payer: Self-pay

## 2024-09-25 ENCOUNTER — Encounter: Payer: Self-pay | Admitting: Nurse Practitioner

## 2024-09-25 DIAGNOSIS — G4733 Obstructive sleep apnea (adult) (pediatric): Secondary | ICD-10-CM | POA: Diagnosis not present

## 2024-09-25 DIAGNOSIS — Z20828 Contact with and (suspected) exposure to other viral communicable diseases: Secondary | ICD-10-CM

## 2024-09-25 MED ORDER — OSELTAMIVIR PHOSPHATE 75 MG PO CAPS: 75.0000 mg | ORAL_CAPSULE | Freq: Two times a day (BID) | ORAL | 0 refills | Status: AC

## 2024-09-25 NOTE — Assessment & Plan Note (Signed)
 Study on 07/03/24, ordered by neurology. Currently using CPAP.

## 2024-09-25 NOTE — Telephone Encounter (Signed)
 FYI Only or Action Required?: Action required by provider: update on patient condition and request for flu medication.  Patient was last seen in primary care on 09/15/2024 by Cannady, Jolene T, NP.  Called Nurse Triage reporting Sore Throat.  Symptoms began today.  Interventions attempted: Nothing.  Symptoms are: unchanged.  Triage Disposition: Call PCP Within 24 Hours  Patient/caregiver understands and will follow disposition?: Yes  Message from [name] sent at 09/25/2024 11:11 AM EST  Reason for Triage: Patients was exposed to the flu. Has a scrathy throat. She would like medications called  Callback #: [id number]  Pharmacy: [facility] [address]   Reason for Disposition  [1] Influenza EXPOSURE within last 48 hours (2 days) AND [2] NOT HIGH RISK AND [3] strongly requests antiviral medication  Answer Assessment - Initial Assessment Questions 1. TYPE of EXPOSURE: How were you exposed? (e.g., close contact, not a close contact)     Close contact.  Adult in home has flu 2. DATE of EXPOSURE: When did the exposure occur? (e.g., hour, days, weeks)     48 hours 3. SYMPTOMS: Do you have any symptoms? (e.g., cough, fever, sore throat, difficulty breathing).     Sore throat 4. HIGH RISK for COMPLICATIONS: Do you have any heart or lung problems? Do you have a weakened immune system? (e.g., CHF, COPD, asthma, HIV positive, chemotherapy, renal failure, diabetes mellitus, sickle cell anemia)     denies   Pharmacy [facility] [address]  Patient states that sister, Cindy Jefferson  is more symptomatic and medications have been called in by Jolene.  Protocols used: Influenza (Flu) Exposure-A-AH

## 2024-09-25 NOTE — Telephone Encounter (Signed)
Can this be sent in for the patient?

## 2024-09-25 NOTE — Patient Instructions (Signed)

## 2024-09-25 NOTE — Telephone Encounter (Signed)
 Called and scheduled the patient a virtual visit.

## 2024-09-25 NOTE — Progress Notes (Signed)
 "  There were no vitals taken for this visit.   Subjective:    Patient ID: Cindy Jefferson, female    DOB: Oct 10, 1955, 69 y.o.   MRN: [id number]  HPI: Cindy [facility] is a 69 y.o. female  Chief Complaint  Patient presents with   Flu Exposure   Virtual Visit via Video Note  I connected with Cindy Jefferson on 09/25/24 at  1:20 PM EST by a video enabled telemedicine application and verified that I am speaking with the correct person using two identifiers.  Location: Patient: [facility] Provider: work   I discussed the limitations of evaluation and management by telemedicine and the availability of in person appointments. The patient expressed understanding and agreed to proceed.  I discussed the assessment and treatment plan with the patient. The patient was provided an opportunity to ask questions and all were answered. The patient agreed with the plan and demonstrated an understanding of the instructions.   The patient was advised to call back or seek an in-person evaluation if the symptoms worsen or if the condition fails to improve as anticipated.  I provided 25 minutes of non-face-to-face time during this encounter.   Yisroel Mullendore T Clayvon Parlett, NP   FLU EXPOSURE Has been around her sister all day due who was diagnosed with flu today. Has had some scratchy throat and nasal symptoms today. Has OSA. Fever: no Cough: no Shortness of breath: no Wheezing: no Chest pain: no Chest tightness: no Chest congestion: no Nasal congestion: no Runny nose: yes Post nasal drip: yes Sneezing: no Sore throat: scratchy throat Swollen glands: no Sinus pressure: no Headache: no Face pain: no Toothache: no Ear pain: none Ear pressure: none Eyes red/itching:no Eye drainage/crusting: no  Vomiting: no Rash: no Fatigue: no Sick contacts: yes Strep contacts: no  Context: stable Recurrent sinusitis: no Relief with OTC cold/cough medications: yes  Treatments attempted: none    Relevant past  medical, surgical, family and social history reviewed and updated as indicated. Interim medical history since our last visit reviewed. Allergies and medications reviewed and updated.  Review of Systems  Constitutional:  Negative for activity change, appetite change, diaphoresis, fatigue and fever.  HENT:  Positive for postnasal drip and rhinorrhea. Negative for congestion, ear discharge, ear pain, sinus pressure, sinus pain and sore throat.   Respiratory:  Negative for cough, chest tightness, shortness of breath and wheezing.   Cardiovascular: Negative.   Gastrointestinal: Negative.   Neurological: Negative.   Psychiatric/Behavioral: Negative.      Per HPI unless specifically indicated above     Objective:    There were no vitals taken for this visit.  Wt Readings from Last 3 Encounters:  09/15/24 160 lb (72.6 kg)  07/01/24 155 lb (70.3 kg)  05/18/24 158 lb (71.7 kg)    Physical Exam Vitals and nursing note reviewed.  Constitutional:      General: She is awake. She is not in acute distress.    Appearance: She is well-developed and well-groomed. She is not ill-appearing or toxic-appearing.  HENT:     Head: Normocephalic.     Right Ear: Hearing normal.     Left Ear: Hearing normal.  Eyes:     General: Lids are normal.        Right eye: No discharge.        Left eye: No discharge.     Conjunctiva/sclera: Conjunctivae normal.  Pulmonary:     Effort: Pulmonary effort is normal. No accessory muscle usage or respiratory  distress.  Musculoskeletal:     Cervical back: Normal range of motion.  Neurological:     Mental Status: She is alert and oriented to person, place, and time.  Psychiatric:        Attention and Perception: Attention normal.        Mood and Affect: Mood normal.        Behavior: Behavior normal. Behavior is cooperative.        Thought Content: Thought content normal.        Judgment: Judgment normal.     Results for orders placed or performed in visit on  09/15/24  Comprehensive metabolic panel with GFR   Collection Time: 09/15/24  3:02 PM  Result Value Ref Range   Glucose 65 (L) 70 - 99 mg/dL   BUN 16 8 - 27 mg/dL   Creatinine, Ser 9.06 0.57 - 1.00 mg/dL   eGFR 67 >40 fO/fpw/8.26   BUN/Creatinine Ratio 17 12 - 28   Sodium 140 134 - 144 mmol/L   Potassium 4.1 3.5 - 5.2 mmol/L   Chloride 101 96 - 106 mmol/L   CO2 26 20 - 29 mmol/L   Calcium  9.2 8.7 - 10.3 mg/dL   Total Protein 6.5 6.0 - 8.5 g/dL   Albumin 4.2 3.9 - 4.9 g/dL   Globulin, Total 2.3 1.5 - 4.5 g/dL   Bilirubin Total 0.3 0.0 - 1.2 mg/dL   Alkaline Phosphatase 102 49 - 135 IU/L   AST 28 0 - 40 IU/L   ALT 20 0 - 32 IU/L  Lipid Panel w/o Chol/HDL Ratio   Collection Time: 09/15/24  3:02 PM  Result Value Ref Range   Cholesterol, Total 160 100 - 199 mg/dL   Triglycerides 82 0 - 149 mg/dL   HDL 87 >60 mg/dL   VLDL Cholesterol Cal 15 5 - 40 mg/dL   LDL Chol Calc (NIH) 58 0 - 99 mg/dL      Assessment & Plan:   Problem List Items Addressed This Visit       Respiratory   OSA (obstructive sleep apnea) - Primary   Study on 07/03/24, ordered by neurology. Currently using CPAP.        Other   Exposure to the flu   Exposure from sister who was diagnosed today. She is having a few mild symptoms. Will start Tamiflu  75 MG BID for 5 days due to exposure and current mild symptoms. Discussed OTC treatments she can use if symptoms become worse. Ensure plenty of rest and fluids. Strict ER precautions provided.        Follow up plan: Return if symptoms worsen or fail to improve.      "

## 2024-09-25 NOTE — Assessment & Plan Note (Signed)
 Exposure from sister who was diagnosed today. She is having a few mild symptoms. Will start Tamiflu  75 MG BID for 5 days due to exposure and current mild symptoms. Discussed OTC treatments she can use if symptoms become worse. Ensure plenty of rest and fluids. Strict ER precautions provided.

## 2024-09-28 ENCOUNTER — Encounter: Payer: Self-pay | Admitting: Nurse Practitioner

## 2024-09-29 ENCOUNTER — Ambulatory Visit

## 2024-10-01 ENCOUNTER — Telehealth: Payer: Self-pay | Admitting: Neurology

## 2024-10-01 NOTE — Telephone Encounter (Signed)
 Patient called in to set up her sleep consult. Best call back # is [phone]

## 2024-10-06 ENCOUNTER — Other Ambulatory Visit: Payer: Self-pay | Admitting: Medical Genetics

## 2024-10-07 NOTE — Telephone Encounter (Signed)
 Patient referral was declined due to patient having completed SS within last 6 months. Our office will not see patients that have completed a SS within the last 3 years. Referring provider/office notified of this decision

## 2024-10-08 ENCOUNTER — Other Ambulatory Visit

## 2024-10-13 ENCOUNTER — Ambulatory Visit

## 2024-10-15 ENCOUNTER — Other Ambulatory Visit

## 2024-10-29 ENCOUNTER — Ambulatory Visit

## 2024-11-03 ENCOUNTER — Ambulatory Visit: Admitting: Nurse Practitioner

## 2025-05-04 ENCOUNTER — Ambulatory Visit

## 2025-05-17 ENCOUNTER — Encounter: Admitting: Nurse Practitioner

## 2025-09-27 ENCOUNTER — Ambulatory Visit: Admitting: Dermatology
